# Patient Record
Sex: Female | Born: 1935 | Race: Black or African American | Hispanic: No | Smoking: Never smoker
Health system: Southern US, Community
[De-identification: ages and names within clinical notes are randomized; demographics above are authoritative.]

## PROBLEM LIST (undated history)

## (undated) DIAGNOSIS — E079 Disorder of thyroid, unspecified: Secondary | ICD-10-CM

## (undated) DIAGNOSIS — K625 Hemorrhage of anus and rectum: Secondary | ICD-10-CM

## (undated) DIAGNOSIS — F329 Major depressive disorder, single episode, unspecified: Secondary | ICD-10-CM

## (undated) DIAGNOSIS — M1990 Unspecified osteoarthritis, unspecified site: Secondary | ICD-10-CM

## (undated) DIAGNOSIS — N390 Urinary tract infection, site not specified: Secondary | ICD-10-CM

## (undated) DIAGNOSIS — E7800 Pure hypercholesterolemia, unspecified: Secondary | ICD-10-CM

## (undated) DIAGNOSIS — D689 Coagulation defect, unspecified: Secondary | ICD-10-CM

## (undated) DIAGNOSIS — Z8542 Personal history of malignant neoplasm of other parts of uterus: Secondary | ICD-10-CM

## (undated) DIAGNOSIS — N63 Unspecified lump in unspecified breast: Secondary | ICD-10-CM

## (undated) DIAGNOSIS — D509 Iron deficiency anemia, unspecified: Secondary | ICD-10-CM

## (undated) DIAGNOSIS — M797 Fibromyalgia: Secondary | ICD-10-CM

## (undated) DIAGNOSIS — F32A Depression, unspecified: Secondary | ICD-10-CM

## (undated) DIAGNOSIS — D72819 Decreased white blood cell count, unspecified: Secondary | ICD-10-CM

## (undated) DIAGNOSIS — K5730 Diverticulosis of large intestine without perforation or abscess without bleeding: Secondary | ICD-10-CM

## (undated) DIAGNOSIS — F0390 Unspecified dementia without behavioral disturbance: Secondary | ICD-10-CM

## (undated) DIAGNOSIS — I2699 Other pulmonary embolism without acute cor pulmonale: Secondary | ICD-10-CM

## (undated) HISTORY — DX: Depression, unspecified: F32.A

## (undated) HISTORY — DX: Fibromyalgia: M79.7

## (undated) HISTORY — PX: ABDOMINAL HYSTERECTOMY: SHX81

## (undated) HISTORY — DX: Hemorrhage of anus and rectum: K62.5

## (undated) HISTORY — DX: Unspecified lump in unspecified breast: N63.0

## (undated) HISTORY — DX: Unspecified dementia, unspecified severity, without behavioral disturbance, psychotic disturbance, mood disturbance, and anxiety: F03.90

## (undated) HISTORY — DX: Iron deficiency anemia, unspecified: D50.9

## (undated) HISTORY — DX: Major depressive disorder, single episode, unspecified: F32.9

## (undated) HISTORY — DX: Other pulmonary embolism without acute cor pulmonale: I26.99

## (undated) HISTORY — PX: KIDNEY SURGERY: SHX687

## (undated) HISTORY — DX: Unspecified osteoarthritis, unspecified site: M19.90

## (undated) HISTORY — DX: Coagulation defect, unspecified: D68.9

## (undated) HISTORY — PX: NECK SURGERY: SHX720

## (undated) HISTORY — DX: Decreased white blood cell count, unspecified: D72.819

## (undated) HISTORY — DX: Disorder of thyroid, unspecified: E07.9

## (undated) HISTORY — DX: Pure hypercholesterolemia, unspecified: E78.00

## (undated) HISTORY — DX: Personal history of malignant neoplasm of other parts of uterus: Z85.42

## (undated) HISTORY — DX: Urinary tract infection, site not specified: N39.0

## (undated) HISTORY — DX: Diverticulosis of large intestine without perforation or abscess without bleeding: K57.30

---

## 1990-10-01 HISTORY — PX: CORONARY ANGIOPLASTY: SHX604

## 1998-01-13 ENCOUNTER — Other Ambulatory Visit: Admission: RE | Admit: 1998-01-13 | Discharge: 1998-01-13 | Payer: Self-pay | Admitting: Hematology & Oncology

## 1998-02-14 ENCOUNTER — Other Ambulatory Visit: Admission: RE | Admit: 1998-02-14 | Discharge: 1998-02-14 | Payer: Self-pay | Admitting: Obstetrics & Gynecology

## 1999-06-08 ENCOUNTER — Ambulatory Visit (HOSPITAL_COMMUNITY): Admission: RE | Admit: 1999-06-08 | Discharge: 1999-06-08 | Payer: Self-pay | Admitting: General Surgery

## 1999-06-08 ENCOUNTER — Encounter (HOSPITAL_BASED_OUTPATIENT_CLINIC_OR_DEPARTMENT_OTHER): Payer: Self-pay | Admitting: General Surgery

## 2000-03-20 ENCOUNTER — Other Ambulatory Visit: Admission: RE | Admit: 2000-03-20 | Discharge: 2000-03-20 | Payer: Self-pay | Admitting: Obstetrics & Gynecology

## 2000-11-12 ENCOUNTER — Encounter (HOSPITAL_BASED_OUTPATIENT_CLINIC_OR_DEPARTMENT_OTHER): Payer: Self-pay | Admitting: General Surgery

## 2000-11-12 ENCOUNTER — Ambulatory Visit (HOSPITAL_COMMUNITY): Admission: RE | Admit: 2000-11-12 | Discharge: 2000-11-12 | Payer: Self-pay | Admitting: General Surgery

## 2001-04-16 ENCOUNTER — Other Ambulatory Visit: Admission: RE | Admit: 2001-04-16 | Discharge: 2001-04-16 | Payer: Self-pay | Admitting: Obstetrics & Gynecology

## 2002-10-01 HISTORY — PX: CATARACT EXTRACTION: SUR2

## 2002-10-07 ENCOUNTER — Other Ambulatory Visit: Admission: RE | Admit: 2002-10-07 | Discharge: 2002-10-07 | Payer: Self-pay | Admitting: Obstetrics & Gynecology

## 2003-01-14 ENCOUNTER — Encounter: Payer: Self-pay | Admitting: Ophthalmology

## 2003-01-19 ENCOUNTER — Ambulatory Visit (HOSPITAL_COMMUNITY): Admission: RE | Admit: 2003-01-19 | Discharge: 2003-01-19 | Payer: Self-pay | Admitting: Ophthalmology

## 2003-11-08 ENCOUNTER — Other Ambulatory Visit: Admission: RE | Admit: 2003-11-08 | Discharge: 2003-11-08 | Payer: Self-pay | Admitting: Obstetrics & Gynecology

## 2005-05-14 ENCOUNTER — Ambulatory Visit: Payer: Self-pay | Admitting: Gastroenterology

## 2005-06-13 ENCOUNTER — Ambulatory Visit: Payer: Self-pay | Admitting: Gastroenterology

## 2005-06-21 ENCOUNTER — Ambulatory Visit: Payer: Self-pay | Admitting: Gastroenterology

## 2006-01-15 ENCOUNTER — Other Ambulatory Visit: Admission: RE | Admit: 2006-01-15 | Discharge: 2006-01-15 | Payer: Self-pay | Admitting: Obstetrics & Gynecology

## 2006-02-19 ENCOUNTER — Encounter: Admission: RE | Admit: 2006-02-19 | Discharge: 2006-02-19 | Payer: Self-pay | Admitting: Obstetrics & Gynecology

## 2006-09-18 ENCOUNTER — Ambulatory Visit: Payer: Self-pay | Admitting: Gastroenterology

## 2006-10-18 ENCOUNTER — Ambulatory Visit: Payer: Self-pay | Admitting: Gastroenterology

## 2006-10-18 LAB — CONVERTED CEMR LAB
BUN: 12 mg/dL (ref 6–23)
CK-MB: 1 ng/mL (ref 0.3–4.0)

## 2006-10-22 ENCOUNTER — Ambulatory Visit: Payer: Self-pay | Admitting: *Deleted

## 2007-02-03 ENCOUNTER — Ambulatory Visit: Payer: Self-pay | Admitting: Gastroenterology

## 2007-02-03 LAB — CONVERTED CEMR LAB
Alkaline Phosphatase: 59 units/L (ref 39–117)
BUN: 15 mg/dL (ref 6–23)
Basophils Absolute: 0 10*3/uL (ref 0.0–0.1)
Creatinine, Ser: 1 mg/dL (ref 0.4–1.2)
Eosinophils Absolute: 0.2 10*3/uL (ref 0.0–0.6)
Folate: 19.5 ng/mL
GFR calc Af Amer: 68 mL/min
Glucose, Bld: 91 mg/dL (ref 70–99)
HCT: 39.3 % (ref 36.0–46.0)
Hemoglobin: 13.2 g/dL (ref 12.0–15.0)
Lymphocytes Relative: 49.8 % — ABNORMAL HIGH (ref 12.0–46.0)
MCV: 95.7 fL (ref 78.0–100.0)
Monocytes Absolute: 0.4 10*3/uL (ref 0.2–0.7)
Neutro Abs: 1.4 10*3/uL (ref 1.4–7.7)
Neutrophils Relative %: 34.9 % — ABNORMAL LOW (ref 43.0–77.0)
Platelets: 250 10*3/uL (ref 150–400)
Total Protein: 7 g/dL (ref 6.0–8.3)
Transferrin: 244.3 mg/dL (ref >=212.0)

## 2007-02-26 ENCOUNTER — Ambulatory Visit: Payer: Self-pay | Admitting: Gastroenterology

## 2007-02-26 LAB — CONVERTED CEMR LAB
Fecal Occult Blood: NEGATIVE
OCCULT 4: NEGATIVE

## 2007-06-12 ENCOUNTER — Ambulatory Visit: Payer: Self-pay | Admitting: Internal Medicine

## 2007-06-12 LAB — CONVERTED CEMR LAB
Basophils Relative: 0.6 % (ref 0.0–1.0)
Eosinophils Absolute: 0.1 10*3/uL (ref 0.0–0.6)
Eosinophils Relative: 2.5 % (ref 0.0–5.0)
HCT: 39.6 % (ref 36.0–46.0)
Lymphocytes Relative: 37.6 % (ref 12.0–46.0)
Monocytes Absolute: 0.3 10*3/uL (ref 0.2–0.7)
Neutro Abs: 2 10*3/uL (ref 1.4–7.7)
Neutrophils Relative %: 50.6 % (ref 43.0–77.0)
RDW: 13 % (ref 11.5–14.6)
WBC: 3.9 10*3/uL — ABNORMAL LOW (ref 4.5–10.5)

## 2007-07-01 ENCOUNTER — Ambulatory Visit: Payer: Self-pay | Admitting: Internal Medicine

## 2007-07-01 HISTORY — PX: COLONOSCOPY: SHX5424

## 2007-07-03 ENCOUNTER — Ambulatory Visit: Payer: Self-pay | Admitting: Internal Medicine

## 2007-07-16 ENCOUNTER — Ambulatory Visit: Payer: Self-pay | Admitting: Internal Medicine

## 2007-07-16 HISTORY — PX: UPPER GASTROINTESTINAL ENDOSCOPY: SHX188

## 2007-08-12 ENCOUNTER — Ambulatory Visit: Payer: Self-pay | Admitting: Cardiology

## 2007-08-14 ENCOUNTER — Ambulatory Visit (HOSPITAL_COMMUNITY): Admission: RE | Admit: 2007-08-14 | Discharge: 2007-08-14 | Payer: Self-pay | Admitting: Pulmonary Disease

## 2007-08-14 ENCOUNTER — Encounter (INDEPENDENT_AMBULATORY_CARE_PROVIDER_SITE_OTHER): Payer: Self-pay | Admitting: Pulmonary Disease

## 2007-08-14 ENCOUNTER — Ambulatory Visit: Payer: Self-pay | Admitting: Vascular Surgery

## 2007-08-19 ENCOUNTER — Ambulatory Visit: Payer: Self-pay

## 2007-08-26 ENCOUNTER — Ambulatory Visit: Payer: Self-pay | Admitting: Cardiology

## 2007-10-27 ENCOUNTER — Ambulatory Visit: Payer: Self-pay | Admitting: Cardiology

## 2007-12-08 ENCOUNTER — Encounter (HOSPITAL_COMMUNITY): Admission: RE | Admit: 2007-12-08 | Discharge: 2007-12-09 | Payer: Self-pay | Admitting: Pulmonary Disease

## 2007-12-18 DIAGNOSIS — F329 Major depressive disorder, single episode, unspecified: Secondary | ICD-10-CM

## 2007-12-18 DIAGNOSIS — E7800 Pure hypercholesterolemia, unspecified: Secondary | ICD-10-CM

## 2007-12-18 DIAGNOSIS — I2510 Atherosclerotic heart disease of native coronary artery without angina pectoris: Secondary | ICD-10-CM

## 2007-12-18 DIAGNOSIS — D72819 Decreased white blood cell count, unspecified: Secondary | ICD-10-CM | POA: Insufficient documentation

## 2007-12-18 DIAGNOSIS — D509 Iron deficiency anemia, unspecified: Secondary | ICD-10-CM | POA: Insufficient documentation

## 2007-12-31 HISTORY — PX: OTHER SURGICAL HISTORY: SHX169

## 2008-01-02 ENCOUNTER — Ambulatory Visit: Payer: Self-pay | Admitting: Cardiology

## 2008-01-02 ENCOUNTER — Ambulatory Visit: Payer: Self-pay

## 2008-01-07 ENCOUNTER — Ambulatory Visit: Payer: Self-pay | Admitting: Cardiology

## 2008-01-15 ENCOUNTER — Encounter: Admission: RE | Admit: 2008-01-15 | Discharge: 2008-01-15 | Payer: Self-pay | Admitting: Pulmonary Disease

## 2008-01-21 ENCOUNTER — Encounter (INDEPENDENT_AMBULATORY_CARE_PROVIDER_SITE_OTHER): Payer: Self-pay | Admitting: Interventional Radiology

## 2008-01-21 ENCOUNTER — Encounter: Admission: RE | Admit: 2008-01-21 | Discharge: 2008-01-21 | Payer: Self-pay | Admitting: Pulmonary Disease

## 2008-01-21 ENCOUNTER — Other Ambulatory Visit: Admission: RE | Admit: 2008-01-21 | Discharge: 2008-01-21 | Payer: Self-pay | Admitting: Interventional Radiology

## 2008-01-28 ENCOUNTER — Ambulatory Visit: Payer: Self-pay | Admitting: Cardiology

## 2008-04-05 ENCOUNTER — Ambulatory Visit: Payer: Self-pay | Admitting: Cardiology

## 2008-04-05 LAB — CONVERTED CEMR LAB
LDL Cholesterol: 52 mg/dL (ref 0–99)
Total CHOL/HDL Ratio: 2.1
Total CK: 49 units/L (ref 7–177)

## 2008-07-06 ENCOUNTER — Ambulatory Visit: Payer: Self-pay | Admitting: Cardiology

## 2008-08-02 ENCOUNTER — Telehealth: Payer: Self-pay | Admitting: Internal Medicine

## 2008-08-16 ENCOUNTER — Ambulatory Visit: Payer: Self-pay | Admitting: Internal Medicine

## 2008-08-20 ENCOUNTER — Telehealth: Payer: Self-pay | Admitting: Internal Medicine

## 2008-09-06 ENCOUNTER — Telehealth: Payer: Self-pay | Admitting: Internal Medicine

## 2009-01-12 ENCOUNTER — Encounter: Payer: Self-pay | Admitting: Cardiology

## 2009-01-12 ENCOUNTER — Ambulatory Visit: Payer: Self-pay | Admitting: Cardiology

## 2009-01-18 ENCOUNTER — Ambulatory Visit: Payer: Self-pay | Admitting: Cardiology

## 2009-01-19 LAB — CONVERTED CEMR LAB
ALT: 20 units/L (ref 0–35)
AST: 25 units/L (ref 0–37)
Albumin: 3.4 g/dL — ABNORMAL LOW (ref 3.5–5.2)
Alkaline Phosphatase: 60 units/L (ref 39–117)
Bilirubin, Direct: 0.1 mg/dL (ref 0.0–0.3)
Cholesterol: 128 mg/dL (ref 0–200)
HDL: 65 mg/dL (ref >39.00)
LDL Cholesterol: 51 mg/dL (ref 0–99)
Total Bilirubin: 0.6 mg/dL (ref 0.3–1.2)
Total CHOL/HDL Ratio: 2
Total Protein: 6.7 g/dL (ref 6.0–8.3)
Triglycerides: 58 mg/dL (ref 0.0–149.0)
VLDL: 11.6 mg/dL (ref 0.0–40.0)

## 2009-01-28 ENCOUNTER — Telehealth: Payer: Self-pay | Admitting: Cardiology

## 2009-03-15 ENCOUNTER — Emergency Department (HOSPITAL_COMMUNITY): Admission: EM | Admit: 2009-03-15 | Discharge: 2009-03-15 | Payer: Self-pay | Admitting: Emergency Medicine

## 2009-03-18 ENCOUNTER — Encounter: Admission: RE | Admit: 2009-03-18 | Discharge: 2009-03-18 | Payer: Self-pay | Admitting: Otolaryngology

## 2009-03-29 ENCOUNTER — Telehealth: Payer: Self-pay | Admitting: Internal Medicine

## 2009-04-18 ENCOUNTER — Ambulatory Visit: Payer: Self-pay | Admitting: Internal Medicine

## 2009-05-24 ENCOUNTER — Ambulatory Visit: Payer: Self-pay | Admitting: Vascular Surgery

## 2009-05-24 ENCOUNTER — Ambulatory Visit (HOSPITAL_COMMUNITY): Admission: RE | Admit: 2009-05-24 | Discharge: 2009-05-24 | Payer: Self-pay | Admitting: Pulmonary Disease

## 2009-05-24 ENCOUNTER — Encounter (INDEPENDENT_AMBULATORY_CARE_PROVIDER_SITE_OTHER): Payer: Self-pay | Admitting: Pulmonary Disease

## 2009-08-17 ENCOUNTER — Encounter: Payer: Self-pay | Admitting: Cardiovascular Disease

## 2009-08-17 ENCOUNTER — Ambulatory Visit: Payer: Self-pay

## 2009-08-17 DIAGNOSIS — R269 Unspecified abnormalities of gait and mobility: Secondary | ICD-10-CM

## 2009-08-29 ENCOUNTER — Encounter: Payer: Self-pay | Admitting: Cardiovascular Disease

## 2009-08-30 ENCOUNTER — Ambulatory Visit: Payer: Self-pay | Admitting: Cardiology

## 2009-08-30 ENCOUNTER — Encounter: Payer: Self-pay | Admitting: Nurse Practitioner

## 2009-08-30 DIAGNOSIS — R42 Dizziness and giddiness: Secondary | ICD-10-CM | POA: Insufficient documentation

## 2009-08-30 DIAGNOSIS — R0789 Other chest pain: Secondary | ICD-10-CM | POA: Insufficient documentation

## 2009-10-17 ENCOUNTER — Encounter: Admission: RE | Admit: 2009-10-17 | Discharge: 2009-10-17 | Payer: Self-pay | Admitting: Otolaryngology

## 2009-10-28 ENCOUNTER — Ambulatory Visit: Payer: Self-pay | Admitting: Cardiology

## 2009-10-28 DIAGNOSIS — R002 Palpitations: Secondary | ICD-10-CM

## 2009-11-05 ENCOUNTER — Observation Stay (HOSPITAL_COMMUNITY): Admission: EM | Admit: 2009-11-05 | Discharge: 2009-11-07 | Payer: Self-pay | Admitting: Emergency Medicine

## 2010-02-06 ENCOUNTER — Encounter: Payer: Self-pay | Admitting: Cardiology

## 2010-03-01 ENCOUNTER — Ambulatory Visit: Payer: Self-pay | Admitting: Cardiology

## 2010-04-06 ENCOUNTER — Ambulatory Visit: Payer: Self-pay | Admitting: Internal Medicine

## 2010-05-08 ENCOUNTER — Encounter: Admission: RE | Admit: 2010-05-08 | Discharge: 2010-05-08 | Payer: Self-pay | Admitting: Obstetrics & Gynecology

## 2010-07-19 ENCOUNTER — Ambulatory Visit: Payer: Self-pay | Admitting: Cardiology

## 2010-07-24 ENCOUNTER — Ambulatory Visit: Payer: Self-pay | Admitting: Cardiology

## 2010-07-24 ENCOUNTER — Inpatient Hospital Stay (HOSPITAL_COMMUNITY): Admission: EM | Admit: 2010-07-24 | Discharge: 2010-08-02 | Payer: Self-pay | Admitting: Emergency Medicine

## 2010-07-25 HISTORY — PX: CARDIAC CATHETERIZATION: SHX172

## 2010-07-26 ENCOUNTER — Encounter: Payer: Self-pay | Admitting: Cardiology

## 2010-08-04 ENCOUNTER — Ambulatory Visit: Payer: Self-pay | Admitting: Cardiology

## 2010-08-04 LAB — CONVERTED CEMR LAB: POC INR: 2.2

## 2010-08-11 ENCOUNTER — Ambulatory Visit: Payer: Self-pay | Admitting: Cardiology

## 2010-08-15 ENCOUNTER — Ambulatory Visit: Payer: Self-pay | Admitting: Cardiology

## 2010-08-15 ENCOUNTER — Ambulatory Visit: Payer: Self-pay | Admitting: Cardiovascular Disease

## 2010-08-15 DIAGNOSIS — I2699 Other pulmonary embolism without acute cor pulmonale: Secondary | ICD-10-CM

## 2010-08-15 DIAGNOSIS — Z87448 Personal history of other diseases of urinary system: Secondary | ICD-10-CM

## 2010-08-15 LAB — CONVERTED CEMR LAB: POC INR: 1.7

## 2010-08-21 ENCOUNTER — Encounter: Payer: Self-pay | Admitting: Cardiology

## 2010-08-22 ENCOUNTER — Ambulatory Visit: Payer: Self-pay | Admitting: Cardiovascular Disease

## 2010-08-22 LAB — CONVERTED CEMR LAB: POC INR: 2.9

## 2010-09-11 ENCOUNTER — Ambulatory Visit: Payer: Self-pay | Admitting: Cardiology

## 2010-09-11 LAB — CONVERTED CEMR LAB: POC INR: 2.3

## 2010-09-15 ENCOUNTER — Ambulatory Visit: Payer: Self-pay | Admitting: Cardiology

## 2010-10-09 ENCOUNTER — Ambulatory Visit: Admission: RE | Admit: 2010-10-09 | Discharge: 2010-10-09 | Payer: Self-pay | Source: Home / Self Care

## 2010-10-09 LAB — CONVERTED CEMR LAB: POC INR: 1.9

## 2010-10-23 ENCOUNTER — Ambulatory Visit: Admission: RE | Admit: 2010-10-23 | Discharge: 2010-10-23 | Payer: Self-pay | Source: Home / Self Care

## 2010-10-29 LAB — CONVERTED CEMR LAB
Basophils Absolute: 0 10*3/uL (ref 0.0–0.1)
Basophils Relative: 0.2 % (ref 0.0–3.0)
Eosinophils Absolute: 0.1 10*3/uL (ref 0.0–0.7)
Eosinophils Relative: 2.5 % (ref 0.0–5.0)
HCT: 37.3 % (ref 36.0–46.0)
Hemoglobin: 12.8 g/dL (ref 12.0–15.0)
Lymphocytes Relative: 42.4 % (ref 12.0–46.0)
Lymphs Abs: 2.1 10*3/uL (ref 0.7–4.0)
MCHC: 34.3 g/dL (ref 30.0–36.0)
MCV: 98 fL (ref 78.0–100.0)
Monocytes Absolute: 0.2 10*3/uL (ref 0.1–1.0)
Monocytes Relative: 3.9 % (ref 3.0–12.0)
Neutro Abs: 2.5 10*3/uL (ref 1.4–7.7)
Neutrophils Relative %: 51 % (ref 43.0–77.0)
Platelets: 271 10*3/uL (ref 150.0–400.0)
RBC: 3.8 M/uL — ABNORMAL LOW (ref 3.87–5.11)
RDW: 14.4 % (ref 11.5–14.6)
WBC: 4.9 10*3/uL (ref 4.5–10.5)

## 2010-11-02 NOTE — Medication Information (Signed)
Summary: rov/ej  Anticoagulant Therapy  Managed by: Earvin Hansen, PharmD Referring MD: Riley Kill MD, Maisie Fus PCP: Myrtie Neither Supervising MD: Daleen Squibb MD, Maisie Fus Indication 1: Pulmonary Embolism Lab Used: [facility] [place] Site: [address] INR POC 2.4 INR RANGE 2.0-3.0  Dietary changes: no    Health status changes: no    Bleeding/hemorrhagic complications: no    Recent/future hospitalizations: no    Any changes in medication regimen? no    Recent/future dental: no  Any missed doses?: no       Is patient compliant with meds? yes       Allergies: No Known Drug Allergies  Anticoagulation Management History:      Positive risk factors for bleeding include an age of 76 years or older.  The bleeding index is 'intermediate risk'.  Positive CHADS2 values include Age > 42 years old.  Anticoagulation responsible provider: Daleen Squibb MD, Maisie Fus.  INR POC: 2.4.  Exp: 07/2011.    Anticoagulation Management Assessment/Plan:      The patient's current anticoagulation dose is Warfarin sodium 5 mg tabs: Use as directed by Anticoagulation Clinic.  The target INR is 2.0-3.0.  The next INR is due 11/13/2010.  Anticoagulation instructions were given to patient.  Results were reviewed/authorized by Earvin Hansen, PharmD.         Prior Anticoagulation Instructions: INR 1.9   Take 1 and 1/2 tablets today then resume taking 1 tablet everday. Will re-check INR in 2 weeks.  Patient states has been eating some extra greens during the holidays.   Current Anticoagulation Instructions: Continue taking 5 mg (1 tablet) daily.  INR 2.4

## 2010-11-02 NOTE — Assessment & Plan Note (Signed)
Summary: 3 month rov   Visit Type:  3 months follow up Referring Provider:  n/a Primary Provider:  Myrtie Neither  CC:  No complains.  History of Present Illness: Once in  awhile she will feel  a pinch when she gets into bed.  She wonder weather it was in the breast or heart.  She was Dr. Jennette Kettle, and had a mammogram.. This was negative.  Current Medications (verified): 1)  Lipitor 40 Mg Tabs (Atorvastatin Calcium) .Marland Kitchen.. 1 By Mouth Once Daily 2)  Plavix 75 Mg Tabs (Clopidogrel Bisulfate) .Marland Kitchen.. 1 By Mouth Once Daily 3)  Xalatan 0.005 % Soln (Latanoprost) .... As Needed 4)  Aspirin 81 Mg  Tabs (Aspirin) .Marland Kitchen.. 1 By Mouth Once Daily 5)  Oscal 500/200 D-3 500-200 Mg-Unit Tabs (Calcium-Vitamin D) .... 2 Once Daily 6)  Metamucil Smooth Texture 28.3 % Powd (Psyllium) .Marland Kitchen.. 1 Tablespoon Every Other Day 7)  Centrum Silver  Tabs (Multiple Vitamins-Minerals) .... Take 1 By Mouth Once Daily 8)  Vitamin D 2000 Unit Tabs (Cholecalciferol) .... Take 1 Tablet By Mouth Once A Day 9)  Lyrica 75 Mg Caps (Pregabalin) .... Once A Day 10)  Zetia 10 Mg Tabs (Ezetimibe) .... Take One Tablet By Mouth Daily.  Allergies (verified): No Known Drug Allergies  Past History:  Past Medical History: Last updated: 08/30/2009 CAD (ICD-414.00)      a. s/p ptca LAD in 1992      b. s/p cath 1995 - nonobs.      c. s/p myoview 2008:  Apical thinning vs. mild mixed scar & ischemia. HYPERCHOLESTEROLEMIA (ICD-272.0) DEPRESSION (ICD-311) LEUKOPENIA, MILD (ICD-288.50) DIVERTICULOSIS OF COLON  GLAUCOMA ORTHOSTATIC DIZZINESS  Past Surgical History: Last updated: 01/11/2009 Cervical spine surgery Hysterectomy  coronary angioplasty of the LAD in 1992.    Senile nuclear cataract, right eye-2004  Family History: Last updated: 04/18/2009 Family History of Pancreatic Cancer:father Family History of Diabetes: mother  No FH of Colon Cancer:  Social History: Last updated: 01/11/2009  Occupation: retired Patient has  never smoked.  Alcohol Use - no Illicit Drug Use - no Patient gets regular exercise.  Risk Factors: Exercise: yes (08/16/2008)  Risk Factors: Smoking Status: never (08/16/2008)  Vital Signs:  Patient profile:   77 year old female Height:      64 inches Weight:      150.50 pounds BMI:     25.93 Pulse rate:   65 / minute Pulse rhythm:   regular Resp:     18 per minute BP sitting:   116 / 70  (left arm) Cuff size:   large  Vitals Entered By: Vikki Ports (March 01, 2010 4:06 PM)  Physical Exam  General:  Well developed, well nourished, in no acute distress. Head:  normocephalic and atraumatic Eyes:  PERRLA/EOM intact; conjunctiva and lids normal. Breasts:  has a line of irritation along bra spot.  Lungs:  Clear bilaterally to auscultation and percussion. Heart:  PMI non displaced.  Normal S1 and S2.  Minimal SEM.  No DM.   EKG  Procedure date:  03/01/2010  Findings:      Nonspecific T abnormality. NSR.  Possible LAE.  Minor non specific ST and T abnormality  Impression & Recommendations:  Problem # 1:  CHEST PAIN, ATYPICAL (ICD-786.59) Her current complaint almost certainly related to irritation from bra.  Reviewed by nursing staff and myself.  Suggestions given.   Problem # 2:  CAD (ICD-414.00) Continues to remain stable.  no acute ECG changes.  Her  updated medication list for this problem includes:    Plavix 75 Mg Tabs (Clopidogrel bisulfate) .Marland Kitchen... 1 by mouth once daily    Aspirin 81 Mg Tabs (Aspirin) .Marland Kitchen... 1 by mouth once daily  Problem # 3:  HYPERCHOLESTEROLEMIA (ICD-272.0) Followed  by her primary. Her updated medication list for this problem includes:    Lipitor 40 Mg Tabs (Atorvastatin calcium) .Marland Kitchen... 1 by mouth once daily    Zetia 10 Mg Tabs (Ezetimibe) .Marland Kitchen... Take one tablet by mouth daily.  Other Orders: EKG w/ Interpretation (93000)  Patient Instructions: 1)  Your physician recommends that you continue on your current medications as directed.  Please refer to the Current Medication list given to you today. 2)  Your physician wants you to follow-up in:   4 MONTHS. You will receive a reminder letter in the mail two months in advance. If you don't receive a letter, please call our office to schedule the follow-up appointment.

## 2010-11-02 NOTE — Medication Information (Signed)
Summary: rov/tm  Anticoagulant Therapy  Managed by: Bethena Midget, RN, BSN Referring MD: Riley Kill MD, Maisie Fus PCP: Myrtie Neither Supervising MD: Jens Som MD, Arlys John Indication 1: Pulmonary Embolism Lab Used: [facility] [place] Site: [address] INR POC 2.3 INR RANGE 2.0-3.0  Dietary changes: no    Health status changes: no    Bleeding/hemorrhagic complications: no    Recent/future hospitalizations: no    Any changes in medication regimen? no    Recent/future dental: no  Any missed doses?: no       Is patient compliant with meds? yes       Allergies: No Known Drug Allergies  Anticoagulation Management History:      The patient is taking warfarin and comes in today for a routine follow up visit.  Positive risk factors for bleeding include an age of 75 years or older.  The bleeding index is 'intermediate risk'.  Positive CHADS2 values include Age > 75 years old.  Anticoagulation responsible provider: Jens Som MD, Arlys John.  INR POC: 2.3.  Cuvette Lot#: [id number].  Exp: 09/2011.    Anticoagulation Management Assessment/Plan:      The patient's current anticoagulation dose is Warfarin sodium 5 mg tabs: Use as directed by Anticoagulation Clinic.  The next INR is due 08/15/2010.  Anticoagulation instructions were given to patient.  Results were reviewed/authorized by Bethena Midget, RN, BSN.  She was notified by Bethena Midget, RN, BSN.         Prior Anticoagulation Instructions: INR 2.2 Continue taking 1 pill each day. Recheck in one week.   Current Anticoagulation Instructions: INR 2.3 Continue 1 pill everyday. Recheck in one week.

## 2010-11-02 NOTE — Medication Information (Signed)
Summary: rov/kh  Anticoagulant Therapy  Managed by: Tammy Sours, PharmD Referring MD: Riley Kill MD, Maisie Fus PCP: Myrtie Neither Supervising MD: Excell Seltzer MD, Casimiro Needle Indication 1: Pulmonary Embolism Lab Used: [facility] [place] Site: [address] INR POC 1.9 INR RANGE 2.0-3.0  Dietary changes: yes       Details: Eating collards due to the holidays   Health status changes: no    Bleeding/hemorrhagic complications: no    Recent/future hospitalizations: no    Any changes in medication regimen? no    Recent/future dental: no  Any missed doses?: no       Is patient compliant with meds? yes      Comments: Patient states has been eating some extra collards due to the holidays. Patient also states that her urine is darker than normal but is more orange looking.   Allergies: No Known Drug Allergies  Anticoagulation Management History:      The patient is taking warfarin and comes in today for a routine follow up visit.  Positive risk factors for bleeding include an age of 75 years or older.  The bleeding index is 'intermediate risk'.  Positive CHADS2 values include Age > 82 years old.  Anticoagulation responsible provider: Excell Seltzer MD, Casimiro Needle.  INR POC: 1.9.  Cuvette Lot#: [id number].  Exp: 07/2011.    Anticoagulation Management Assessment/Plan:      The patient's current anticoagulation dose is Warfarin sodium 5 mg tabs: Use as directed by Anticoagulation Clinic.  The target INR is 2.0-3.0.  The next INR is due 10/23/2010.  Anticoagulation instructions were given to patient.  Results were reviewed/authorized by Tammy Sours, PharmD.         Prior Anticoagulation Instructions: INR 2.3  Continue same dose of 1 tablet every day.  Recheck INR in 4 weeks.   Current Anticoagulation Instructions: INR 1.9   Take 1 and 1/2 tablets today then resume taking 1 tablet everday. Will re-check INR in 2 weeks.  Patient states has been eating some extra greens during the  holidays.

## 2010-11-02 NOTE — Medication Information (Signed)
Summary: rov/mw  Anticoagulant Therapy  Managed by: Reina Fuse, PharmD Referring MD: Riley Kill MD, Maisie Fus PCP: Myrtie Neither Supervising MD: Clifton James MD, Cristal Deer Indication 1: Pulmonary Embolism Lab Used: [facility] [place] Site: [address] INR POC 2.9 INR RANGE 2.0-3.0  Dietary changes: no    Health status changes: no    Bleeding/hemorrhagic complications: no    Recent/future hospitalizations: no    Any changes in medication regimen? no    Recent/future dental: no  Any missed doses?: no       Is patient compliant with meds? yes      Comments: Pt was low at last visit due to a missed dose.   Allergies: No Known Drug Allergies  Anticoagulation Management History:      The patient is taking warfarin and comes in today for a routine follow up visit.  Positive risk factors for bleeding include an age of 75 years or older.  The bleeding index is 'intermediate risk'.  Positive CHADS2 values include Age > 30 years old.  Anticoagulation responsible provider: Clifton James MD, Cristal Deer.  INR POC: 2.9.  Cuvette Lot#: [id number].  Exp: 09/2011.    Anticoagulation Management Assessment/Plan:      The patient's current anticoagulation dose is Warfarin sodium 5 mg tabs: Use as directed by Anticoagulation Clinic.  The next INR is due 09/12/2010.  Anticoagulation instructions were given to patient.  Results were reviewed/authorized by Reina Fuse, PharmD.  She was notified by Reina Fuse PharmD.         Prior Anticoagulation Instructions: INR 1.7 Today take 1.5 tablets. Then resume normal dosing schedule of 1 tablet everyday. Recheck in 1 week.   Current Anticoagulation Instructions: INR 2.9  Continue taking Coumadin 1 tab (5 mg) every day. Return to clinic in 3 weeks.

## 2010-11-02 NOTE — Assessment & Plan Note (Signed)
Summary: eph per dayna call/lg   Visit Type:  Follow-up Referring Provider:  n/a Primary Provider:  Myrtie Neither  CC:  Post-hospital.  History of Present Illness: Ms Castrellon is here today for a hospital follow up after she was diagnosed with pulm embolism last month.  She has been compliant with her meds and last INR checked was 2.3. She is due for inr today. No chest pain noted, though she does note a "thud" in her chest at night which goes away by itself, not bothering her, exacerbated by palpation. No association with exertion. No bleed PR noted.  Lipid profile: Last checked LDL 51 on lipitor.  Hematuria has completely resolved now. She had a follow up appointment with [facility] on friday.  Non obstructive CAD: cont aspirin. No BB due to bradycardia at discharge. 80 bpm today.      Problems Prior to Update: 1)  Hematuria, Hx of  (ICD-V13.09) 2)  Pulmonary Embolism  (ICD-415.19) 3)  Palpitations  (ICD-785.1) 4)  Orthostatic Dizziness  (ICD-780.4) 5)  Chest Pain, Atypical  (ICD-786.59) 6)  Unsteady Gait  (ICD-781.2) 7)  Cad  (ICD-414.00) 8)  Hypercholesterolemia  (ICD-272.0) 9)  Obesity, Mild  (ICD-278.02) 10)  Depression  (ICD-311) 11)  Unspecified Cardiovascular Disease  (ICD-429.2) 12)  Iron Deficiency  (ICD-280.9) 13)  Rectal Bleeding  (ICD-569.3) 14)  Abdominal Pain, Left Lower Quadrant  (ICD-789.04) 15)  Leukopenia, Mild  (ICD-288.50) 16)  Urinary Hesitancy  (ICD-788.64) 17)  Diverticulosis of Colon  (ICD-562.10)  Current Medications (verified): 1)  Lipitor 40 Mg Tabs (Atorvastatin Calcium) .Marland Kitchen.. 1 By Mouth Once Daily 2)  Latanoprost 0.005 % Soln (Latanoprost) .... One Drop Each Eye At Bedtime 3)  Aspirin 81 Mg  Tabs (Aspirin) .Marland Kitchen.. 1 By Mouth Once Daily 4)  Oscal 500/200 D-3 500-200 Mg-Unit Tabs (Calcium-Vitamin D) .Marland Kitchen.. 1 Once Daily 5)  Metamucil Smooth Texture 28.3 % Powd (Psyllium) .... 2  Tablespoon Every Day 6)  Centrum Silver  Tabs (Multiple  Vitamins-Minerals) .... Take 1 By Mouth Once Daily 7)  Lyrica 75 Mg Caps (Pregabalin) .... Once A Day As Needed 8)  Probiotic  Caps (Probiotic Product) .... Once Daily 9)  Warfarin Sodium 5 Mg Tabs (Warfarin Sodium) .... Use As Directed By Anticoagulation Clinic 10)  Nitrostat 0.4 Mg Subl (Nitroglycerin) .Marland Kitchen.. 1 Tablet Under Tongue At Onset of Chest Pain; You May Repeat Every 5 Minutes For Up To 3 Doses. 11)  Dulcolax 5 Mg Tbec (Bisacodyl) .... As Needed 12)  Zolpidem Tartrate 6.25 Mg Cr-Tabs (Zolpidem Tartrate) .... As Needed At Bedtime 13)  Metamucil 30.9 % Powd (Psyllium) .... 2 Tsp Once A Day  Allergies (verified): No Known Drug Allergies  Past History:  Past Medical History: Last updated: 04/06/2010 CAD (ICD-414.00)      a. s/p ptca LAD in 1992      b. s/p cath 1995 - nonobs.      c. s/p myoview 2008:  Apical thinning vs. mild mixed scar & ischemia. HYPERCHOLESTEROLEMIA (ICD-272.0) DEPRESSION (ICD-311) LEUKOPENIA, MILD (ICD-288.50) DIVERTICULOSIS OF COLON  GLAUCOMA ORTHOSTATIC DIZZINESS Fibromyalgia  Family History: Last updated: 04/18/2009 Family History of Pancreatic Cancer:father Family History of Diabetes: mother  No FH of Colon Cancer:  Risk Factors: Exercise: yes (08/16/2008)  Family History: Reviewed history from 04/18/2009 and no changes required. Family History of Pancreatic Cancer:father Family History of Diabetes: mother  No FH of Colon Cancer:  Social History: Reviewed history from 01/11/2009 and no changes required.  Occupation: retired Patient has never smoked.  Alcohol Use - no Illicit Drug Use - no Patient gets regular exercise.  Review of Systems      See HPI  Vital Signs:  Patient profile:   77 year old female Height:      64 inches Weight:      154 pounds BMI:     26.53 Pulse rate:   80 / minute Pulse rhythm:   regular Resp:     18 per minute BP sitting:   106 / 64  (left arm) Cuff size:   large  Vitals Entered By: Vikki Ports (August 15, 2010 1:44 PM)  Physical Exam  Additional Exam:  Gen: AOx3, in no acute distress Eyes: PERRL, EOMI ENT:MMM, No erythema noted in posterior pharynx Neck: No JVD, No LAP Chest: CTAB with  good respiratory effort CVS: regular rhythmic rate, NO M/R/G, S1 S2 normal Abdo: soft,ND, BS+x4, Non tender and No hepatosplenomegaly EXT: No odema noted Neuro: Non focal, gait is normal Skin: no rashes noted.    EKG  Procedure date:  08/15/2010  Findings:      Unchanged from previous EKG Non specific ST and T wave changes. 80/min  Impression & Recommendations:  Problem # 1:  PULMONARY EMBOLISM (ICD-415.19) Assessment Unchanged Keep on coumadin. Check INR today. Presented with chest pain, and followup cath revealed no evidence of recurrence and patent PTCA site.  CT consistent with multiple PE.  Will keep on current regimen.  Discussed in detail with patient.  TS  Her updated medication list for this problem includes:    Aspirin 81 Mg Tabs (Aspirin) .Marland Kitchen... 1 by mouth once daily    Warfarin Sodium 5 Mg Tabs (Warfarin sodium) ..... Use as directed by anticoagulation clinic  Problem # 2:  CAD (ICD-414.00) Assessment: New Cath shows non obstructive disease. Continue Aspirin.  Her updated medication list for this problem includes:    Aspirin 81 Mg Tabs (Aspirin) .Marland Kitchen... 1 by mouth once daily    Warfarin Sodium 5 Mg Tabs (Warfarin sodium) ..... Use as directed by anticoagulation clinic    Nitrostat 0.4 Mg Subl (Nitroglycerin) .Marland Kitchen... 1 tablet under tongue at onset of chest pain; you may repeat every 5 minutes for up to 3 doses.  < Template too large >  Problem # 3:  HYPERCHOLESTEROLEMIA (ICD-272.0) Assessment: Unchanged At goal.  Her updated medication list for this problem includes:    Lipitor 40 Mg Tabs (Atorvastatin calcium) .Marland Kitchen... 1 by mouth once daily  CHOL: 128 (01/18/2009)   LDL: 51 (01/18/2009)   HDL: 65.00 (01/18/2009)   TG: 58.0 (01/18/2009)  Problem # 4:   HEMATURIA, HX OF (ICD-V13.09) Assessment: New Following with [facility]. Improved now and does not notice any coloured urine now.  Will check CBC today.  Other Orders: TLB-CBC Platelet - w/Differential ([id number]-[id number])  Patient Instructions: 1)  Your physician recommends that you schedule a follow-up appointment in: 1 MONTH 2)  Your physician recommends that you return for lab work [id number]

## 2010-11-02 NOTE — Assessment & Plan Note (Signed)
Summary: LOWER ABD PAIN//BLOOD IN STOOL...AS.   History of Present Illness Visit Type: Follow-up Visit Primary GI MD: Stan Head MD [facility] Primary Provider: Myrtie Neither Chief Complaint: lower abdominal pain History of Present Illness:   Occasional bilateral lower abdominal pain. Usually pre-defecation and resolves with defecation. She had one streak of red blood on a stool after straining one month ago. No other bleeding. She was started on Lyrica for chest and leg pain and a diagnosis of fibromyalgia 3 months ago. It is helping. Using metamucil and prune juice but still has some constipation. Moves her bowels every other day. She will double her prune juice to help improve bowel habits. She never did take Align as recommended last year though she purchased it.   GI Review of Systems    Reports abdominal pain and  chest pain.     Location of  Abdominal pain: lower abdomen.    Denies acid reflux, belching, bloating, dysphagia with liquids, dysphagia with solids, heartburn, loss of appetite, nausea, vomiting, vomiting blood, weight loss, and  weight gain.      Reports change in bowel habits.      Colonoscopy  Procedure date:  07/01/2007  Findings:      Sigmoid diverticulosis    Current Medications (verified): 1)  Lipitor 40 Mg Tabs (Atorvastatin Calcium) .Marland Kitchen.. 1 By Mouth Once Daily 2)  Plavix 75 Mg Tabs (Clopidogrel Bisulfate) .Marland Kitchen.. 1 By Mouth Once Daily 3)  Xalatan 0.005 % Soln (Latanoprost) .... As Needed 4)  Aspirin 81 Mg  Tabs (Aspirin) .Marland Kitchen.. 1 By Mouth Once Daily 5)  Oscal 500/200 D-3 500-200 Mg-Unit Tabs (Calcium-Vitamin D) .... 2 Once Daily 6)  Metamucil Smooth Texture 28.3 % Powd (Psyllium) .Marland Kitchen.. 1 Tablespoon Every Other Day 7)  Centrum Silver  Tabs (Multiple Vitamins-Minerals) .... Take 1 By Mouth Once Daily 8)  Vitamin D 2000 Unit Tabs (Cholecalciferol) .... Take 1 Tablet By Mouth Once A Day 9)  Lyrica 75 Mg Caps (Pregabalin) .... Once A Day 10)  Zetia 10 Mg  Tabs (Ezetimibe) .... Take One Tablet By Mouth Daily. 11)  Probiotic  Caps (Probiotic Product) .... Once Daily  Allergies (verified): No Known Drug Allergies  Past History:  Past Medical History: CAD (ICD-414.00)      a. s/p ptca LAD in 1992      b. s/p cath 1995 - nonobs.      c. s/p myoview 2008:  Apical thinning vs. mild mixed scar & ischemia. HYPERCHOLESTEROLEMIA (ICD-272.0) DEPRESSION (ICD-311) LEUKOPENIA, MILD (ICD-288.50) DIVERTICULOSIS OF COLON  GLAUCOMA ORTHOSTATIC DIZZINESS Fibromyalgia  Past Surgical History: Reviewed history from 01/11/2009 and no changes required. Cervical spine surgery Hysterectomy  coronary angioplasty of the LAD in 1992.    Senile nuclear cataract, right eye-2004  Family History: Reviewed history from 04/18/2009 and no changes required. Family History of Pancreatic Cancer:father Family History of Diabetes: mother  No FH of Colon Cancer:  Social History: Reviewed history from 01/11/2009 and no changes required.  Occupation: retired Patient has never smoked.  Alcohol Use - no Illicit Drug Use - no Patient gets regular exercise.  Vital Signs:  Patient profile:   77 year old female Height:      64 inches Weight:      156 pounds BMI:     26.87 Pulse rate:   60 / minute Pulse rhythm:   regular BP sitting:   106 / 60  (left arm) Cuff size:   regular  Vitals Entered By: June McMurray CMA Duncan Dull) (April 06, 2010 3:45 PM)  Physical Exam  General:  Well developed, well nourished, in no acute distress. Abdomen:  soft, nontender, nondistended, bowel sounds present   Impression & Recommendations:  Problem # 1:  ABDOMINAL PAIN, LEFT LOWER QUADRANT (ICD-789.04) Assessment Unchanged This is related tio defecation and diverticulsosi. Will increase prune juice and possibly metamucil. If unhelpfuul she is to return. She is concerned about ovaries and wanted a CT scan. i told her to discuss ovaries with Dr. Jennette Kettle and that exam or [place] would  be better for those.  Problem # 2:  DIVERTICULOSIS OF COLON (ICD-562.10) Assessment: Unchanged Part of her problems. To increase prune juice and possibly metamucil.  Problem # 3:  RECTAL BLEEDING (ICD-569.3) Assessment: Unchanged Rare ano-rectal bleeding with straining, chronic and unchanged. No plans to investigate further.  Patient Instructions: 1)  Increase prune juice to one glass twice a day always. 2)  You may also need to increase your metamucil to 2 tablespoons a day. 3)  These changes are to help you have bowel movements more often that should help your abdominal pain. 4)  If this does not work please come back to see Dr. Leone Payor 5)  Copy sent to : Corine Shelter, MD, Konrad Dolores, MD 6)  The medication list was reviewed and reconciled.  All changed / newly prescribed medications were explained.  A complete medication list was provided to the patient / caregiver.

## 2010-11-02 NOTE — Assessment & Plan Note (Signed)
Summary: f28m   Visit Type:  Follow-up Referring Provider:  n/a Primary Provider:  Myrtie Neither  CC:  Chest pressure- legs tightness.  History of Present Illness: Patient lives alone.  Gets along ok.  Still sees Dr. Petra Kuba.  She gets tightening in the legs.  She was told she has fibromyalgia. Sometimes she will have some tightness in the chest, but activity seems to help this go away.  She has exercise at church, she gets no tightness in the chest.  At night sometime, she will feel a little thum[, and she wil have some soreness there.    Current Medications (verified): 1)  Lipitor 40 Mg Tabs (Atorvastatin Calcium) .Marland Kitchen.. 1 By Mouth Once Daily 2)  Plavix 75 Mg Tabs (Clopidogrel Bisulfate) .Marland Kitchen.. 1 By Mouth Once Daily 3)  Xalatan 0.005 % Soln (Latanoprost) .... As Needed 4)  Aspirin 81 Mg  Tabs (Aspirin) .Marland Kitchen.. 1 By Mouth Once Daily 5)  Oscal 500/200 D-3 500-200 Mg-Unit Tabs (Calcium-Vitamin D) .... 2 Once Daily 6)  Metamucil Smooth Texture 28.3 % Powd (Psyllium) .... 2  Tablespoon Every Day 7)  Centrum Silver  Tabs (Multiple Vitamins-Minerals) .... Take 1 By Mouth Once Daily 8)  Lyrica 75 Mg Caps (Pregabalin) .... Once A Day 9)  Probiotic  Caps (Probiotic Product) .... Once Daily  Allergies (verified): No Known Drug Allergies  Vital Signs:  Patient profile:   77 year old female Height:      64 inches Weight:      155.50 pounds BMI:     26.79 Pulse rate:   64 / minute Pulse rhythm:   regular Resp:     18 per minute BP sitting:   130 / 70  (left arm) Cuff size:   large  Vitals Entered By: Vikki Ports (July 19, 2010 2:27 PM)  Physical Exam  General:  Well developed, well nourished, in no acute distress. Head:  normocephalic and atraumatic Eyes:  PERRLA/EOM intact; conjunctiva and lids normal. Chest Wall:  tender to touch at site she marked with magic marker.  Similar to last visit.  Chostochondral junction. Lungs:  Clear bilaterally to auscultation and  percussion. Heart:  PMI non displaced.  NOrmal S1 and S2.  No murmur, or rub, or gallop.  Pulses:  pulses normal in all 4 extremities Extremities:  No clubbing or cyanosis. Neurologic:  Alert and oriented x 3.   EKG  Procedure date:  07/19/2010  Findings:      NSR.  Nonspecific ST and T changes.  Impression & Recommendations:  Problem # 1:  CAD (ICD-414.00) Current symptoms are muskuloskeletal in nature.  Tender to touch.  Therapuetic things discussed. Her updated medication list for this problem includes:    Plavix 75 Mg Tabs (Clopidogrel bisulfate) .Marland Kitchen... 1 by mouth once daily    Aspirin 81 Mg Tabs (Aspirin) .Marland Kitchen... 1 by mouth once daily  Her updated medication list for this problem includes:    Plavix 75 Mg Tabs (Clopidogrel bisulfate) .Marland Kitchen... 1 by mouth once daily    Aspirin 81 Mg Tabs (Aspirin) .Marland Kitchen... 1 by mouth once daily  Orders: EKG w/ Interpretation (93000)  Problem # 2:  CHEST PAIN, ATYPICAL (ICD-786.59) see above Her updated medication list for this problem includes:    Plavix 75 Mg Tabs (Clopidogrel bisulfate) .Marland Kitchen... 1 by mouth once daily    Aspirin 81 Mg Tabs (Aspirin) .Marland Kitchen... 1 by mouth once daily  Problem # 3:  HYPERCHOLESTEROLEMIA (ICD-272.0) Needs to have followup with Dr. Petra Kuba regarding labs.  Will forward note to him The following medications were removed from the medication list:    Zetia 10 Mg Tabs (Ezetimibe) .Marland Kitchen... Take one tablet by mouth daily. Her updated medication list for this problem includes:    Lipitor 40 Mg Tabs (Atorvastatin calcium) .Marland Kitchen... 1 by mouth once daily  Patient Instructions: 1)  Your physician recommends that you continue on your current medications as directed. Please refer to the Current Medication list given to you today. 2)  Your physician wants you to follow-up in:6 MONTHS   You will receive a reminder letter in the mail two months in advance. If you don't receive a letter, please call our office to schedule the follow-up  appointment.

## 2010-11-02 NOTE — Assessment & Plan Note (Signed)
Summary: 2 month rov   Referring Provider:  n/a Primary Provider:  Myrtie Neither   History of Present Illness: Erin major concern is a little tightening in Erin legs.  She has a " thump" in Erin left breast.  Sometimes it hurts and is tender.  ccasional dizziness.  No definite chest pain.  We discussed Erin cholesterol in detail.  She was on Zetia before.  Current Medications (verified): 1)  Lipitor 40 Mg Tabs (Atorvastatin Calcium) .Marland Kitchen.. 1 By Mouth Once Daily 2)  Plavix 75 Mg Tabs (Clopidogrel Bisulfate) .Marland Kitchen.. 1 By Mouth Once Daily 3)  Xalatan 0.005 % Soln (Latanoprost) .... As Needed 4)  Aspirin 81 Mg  Tabs (Aspirin) .Marland Kitchen.. 1 By Mouth Once Daily 5)  Oscal 500/200 D-3 500-200 Mg-Unit Tabs (Calcium-Vitamin D) .... 2 Once Daily 6)  Multivitamins   Tabs (Multiple Vitamin) .Marland Kitchen.. 1 By Mouth Once Daily 7)  Metamucil Smooth Texture 28.3 % Powd (Psyllium) .Marland Kitchen.. 1 Tablespoon Every Other Day 8)  Centrum Silver  Tabs (Multiple Vitamins-Minerals) .... Take 1 By Mouth Once Daily  Allergies (verified): No Known Drug Allergies  Vital Signs:  Patient profile:   77 year old female Height:      64 inches Weight:      156 pounds Pulse rate:   78 / minute Resp:     16 per minute BP sitting:   106 / 62  (right arm)  Vitals Entered By: Marrion Coy, CNA (October 28, 2009 2:18 PM)  Physical Exam  General:  Well developed, well nourished, in no acute distress. Lungs:  Clear bilaterally to auscultation and percussion. Heart:  No displacement of PMI.  Normal s1 and S 2.  No rub or murmur.  Msk:  Slight tender L chest.   Extremities:  No edema.  No evid of DVT. Neurologic:  Alert and oriented x 3. a  EKG  Procedure date:  10/28/2009  Findings:      NSR.  Nonspecific ST and T change, improved from prior tracing.  Impression & Recommendations:  Problem # 1:  CHEST PAIN, ATYPICAL (ICD-786.59)  Atypical symptoms.  Tender to palpation.  Nothing to suggest ischemia. Erin updated medication list  for this problem includes:    Plavix 75 Mg Tabs (Clopidogrel bisulfate) .Marland Kitchen... 1 by mouth once daily    Aspirin 81 Mg Tabs (Aspirin) .Marland Kitchen... 1 by mouth once daily  Orders: EKG w/ Interpretation (93000)  Problem # 2:  HYPERCHOLESTEROLEMIA (ICD-272.0)  Talked about a lipitor holiday because of leg symptoms, but she is scared to stop.  Exam not revealing.  Sxs could be related but not clearly.   Erin updated medication list for this problem includes:    Lipitor 40 Mg Tabs (Atorvastatin calcium) .Marland Kitchen... 1 by mouth once daily  Orders: EKG w/ Interpretation (93000)  Problem # 3:  PALPITATIONS (ICD-785.1) She would like an ECG.  Will do at Erin request.  Problem # 4:  CAD (ICD-414.00)  Has prior PCI, no current symptoms to suggest recurrent ischemia.  Continue medical therapy.  Erin updated medication list for this problem includes:    Plavix 75 Mg Tabs (Clopidogrel bisulfate) .Marland Kitchen... 1 by mouth once daily    Aspirin 81 Mg Tabs (Aspirin) .Marland Kitchen... 1 by mouth once daily  Orders: EKG w/ Interpretation (93000)  Patient Instructions: 1)  Your physician recommends that you schedule a follow-up appointment in: 3 MONTHS 2)  Your physician recommends that you continue on your current medications as directed. Please refer to the Current  Medication list given to you today.

## 2010-11-02 NOTE — Assessment & Plan Note (Signed)
Summary: 3 month rov/sl   Visit Type:  3 months follow up  CC:  Some kind of chest discomfort at night once in awhile.  Current Medications (verified): 1)  Lipitor 40 Mg Tabs (Atorvastatin Calcium) .Marland Kitchen.. 1 By Mouth Once Daily 2)  Plavix 75 Mg Tabs (Clopidogrel Bisulfate) .Marland Kitchen.. 1 By Mouth Once Daily 3)  Xalatan 0.005 % Soln (Latanoprost) .... As Needed 4)  Aspirin 81 Mg  Tabs (Aspirin) .Marland Kitchen.. 1 By Mouth Once Daily 5)  Oscal 500/200 D-3 500-200 Mg-Unit Tabs (Calcium-Vitamin D) .... 2 Once Daily 6)  Metamucil Smooth Texture 28.3 % Powd (Psyllium) .Marland Kitchen.. 1 Tablespoon Every Other Day 7)  Centrum Silver  Tabs (Multiple Vitamins-Minerals) .... Take 1 By Mouth Once Daily  Allergies (verified): No Known Drug Allergies  Vital Signs:  Patient profile:   77 year old female Height:      64 inches Weight:      155.50 pounds BMI:     26.79 Pulse rate:   63 / minute Pulse rhythm:   regular Resp:     18 per minute BP sitting:   110 / 75  (left arm) Cuff size:   large  Vitals Entered By: Vikki Ports (Feb 06, 2010 3:14 PM)  The patient left the office before seeing Dr Riley Kill.  The pt had to pick-up her grandson.  Mrs Onofre said she would call back to reschedule her appointment. Julieta Gutting, RN, BSN  Feb 06, 2010 4:38 PM

## 2010-11-02 NOTE — Medication Information (Signed)
Summary: rov/sl  Anticoagulant Therapy  Managed by: Weston Brass, PharmD Referring MD: Riley Kill MD, Maisie Fus PCP: Myrtie Neither Supervising MD: Juanda Chance MD, Mahkai Fangman Indication 1: Pulmonary Embolism Lab Used: [facility] [place] Site: [address] INR POC 2.3 INR RANGE 2.0-3.0  Dietary changes: no    Health status changes: no    Bleeding/hemorrhagic complications: no    Recent/future hospitalizations: no    Any changes in medication regimen? no    Recent/future dental: no  Any missed doses?: no       Is patient compliant with meds? yes       Allergies: No Known Drug Allergies  Anticoagulation Management History:      The patient is taking warfarin and comes in today for a routine follow up visit.  Positive risk factors for bleeding include an age of 75 years or older.  The bleeding index is 'intermediate risk'.  Positive CHADS2 values include Age > 65 years old.  Anticoagulation responsible provider: Juanda Chance MD, Smitty Cords.  INR POC: 2.3.  Cuvette Lot#: [id number].  Exp: 07/2011.    Anticoagulation Management Assessment/Plan:      The patient's current anticoagulation dose is Warfarin sodium 5 mg tabs: Use as directed by Anticoagulation Clinic.  The target INR is 2.0-3.0.  The next INR is due 10/09/2010.  Anticoagulation instructions were given to patient.  Results were reviewed/authorized by Weston Brass, PharmD.  She was notified by Weston Brass PharmD.         Prior Anticoagulation Instructions: INR 2.9  Continue taking Coumadin 1 tab (5 mg) every day. Return to clinic in 3 weeks.   Current Anticoagulation Instructions: INR 2.3  Continue same dose of 1 tablet every day.  Recheck INR in 4 weeks.

## 2010-11-02 NOTE — Medication Information (Signed)
Summary: per Dayna the PA dx: PE  Anticoagulant Therapy  Managed by: Bethena Midget, RN, BSN Referring MD: Riley Kill MD, Maisie Fus PCP: Myrtie Neither Supervising MD: Myrtis Ser MD, Tinnie Gens Indication 1: Pulmonary Embolism Lab Used: [facility] [place] Site: [address] INR POC 2.2 INR RANGE 2.0-3.0  Dietary changes: no    Health status changes: no    Bleeding/hemorrhagic complications: no    Recent/future hospitalizations: no    Any changes in medication regimen? no    Recent/future dental: no  Any missed doses?: no       Is patient compliant with meds? yes      Comments: New pt education completed started coumadin on 07/28/10 in hospital   Current Medications (verified): 1)  Lipitor 40 Mg Tabs (Atorvastatin Calcium) .Marland Kitchen.. 1 By Mouth Once Daily 2)  Xalatan 0.005 % Soln (Latanoprost) .Marland Kitchen.. 1 Gtt in Both Eyes 3)  Aspirin 81 Mg  Tabs (Aspirin) .Marland Kitchen.. 1 By Mouth Once Daily 4)  Oscal 500/200 D-3 500-200 Mg-Unit Tabs (Calcium-Vitamin D) .Marland Kitchen.. 1 Once Daily 5)  Metamucil Smooth Texture 28.3 % Powd (Psyllium) .... 2  Tablespoon Every Day 6)  Centrum Silver  Tabs (Multiple Vitamins-Minerals) .... Take 1 By Mouth Once Daily 7)  Lyrica 75 Mg Caps (Pregabalin) .... Once A Day As Needed 8)  Probiotic  Caps (Probiotic Product) .... Once Daily 9)  Warfarin Sodium 5 Mg Tabs (Warfarin Sodium) .... Use As Directed By Anticoagulation Clinic 10)  Nitrostat 0.4 Mg Subl (Nitroglycerin) .Marland Kitchen.. 1 Tablet Under Tongue At Onset of Chest Pain; You May Repeat Every 5 Minutes For Up To 3 Doses. 11)  Dulcolax 5 Mg Tbec (Bisacodyl) .... As Needed 12)  Zolpidem Tartrate 6.25 Mg Cr-Tabs (Zolpidem Tartrate) .... As Needed At Bedtime  Allergies (verified): No Known Drug Allergies  Anticoagulation Management History:      The patient comes in today for her initial visit for anticoagulation therapy.  Positive risk factors for bleeding include an age of 75 years or older.  The bleeding index is  'intermediate risk'.  Positive CHADS2 values include Age > 65 years old.  Anticoagulation responsible provider: Myrtis Ser MD, Tinnie Gens.  INR POC: 2.2.  Cuvette Lot#: [id number].  Exp: 08/2011.    Anticoagulation Management Assessment/Plan:      The patient's current anticoagulation dose is Warfarin sodium 5 mg tabs: Use as directed by Anticoagulation Clinic.  The next INR is due 08/11/2010.  Anticoagulation instructions were given to patient.  Results were reviewed/authorized by Bethena Midget, RN, BSN.  She was notified by Bethena Midget, RN, BSN.         Current Anticoagulation Instructions: INR 2.2 Continue taking 1 pill each day. Recheck in one week.

## 2010-11-02 NOTE — Medication Information (Signed)
Summary: rov/tm  Anticoagulant Therapy  Managed by: Lyna Poser, PharmD Referring MD: Riley Kill MD, Maisie Fus PCP: Myrtie Neither Supervising MD: Excell Seltzer MD, Casimiro Needle Indication 1: Pulmonary Embolism Lab Used: [facility] [place] Site: [address] INR POC 1.7 INR RANGE 2.0-3.0  Dietary changes: no    Health status changes: no    Bleeding/hemorrhagic complications: no    Recent/future hospitalizations: no    Any changes in medication regimen? no    Recent/future dental: no  Any missed doses?: yes     Details: missed 1 dose. She thinks it was before the INR check on the 11th but can't remember what day.  Is patient compliant with meds? yes       Allergies: No Known Drug Allergies  Anticoagulation Management History:      The patient is taking warfarin and comes in today for a routine follow up visit.  Positive risk factors for bleeding include an age of 75 years or older.  The bleeding index is 'intermediate risk'.  Positive CHADS2 values include Age > 70 years old.  Anticoagulation responsible provider: Excell Seltzer MD, Casimiro Needle.  INR POC: 1.7.  Cuvette Lot#: [id number].  Exp: 09/2011.    Anticoagulation Management Assessment/Plan:      The patient's current anticoagulation dose is Warfarin sodium 5 mg tabs: Use as directed by Anticoagulation Clinic.  The next INR is due 08/22/2010.  Anticoagulation instructions were given to patient.  Results were reviewed/authorized by Lyna Poser, PharmD.         Prior Anticoagulation Instructions: INR 2.3 Continue 1 pill everyday. Recheck in one week.   Current Anticoagulation Instructions: INR 1.7 Today take 1.5 tablets. Then resume normal dosing schedule of 1 tablet everyday. Recheck in 1 week.

## 2010-11-02 NOTE — Assessment & Plan Note (Signed)
Summary: f80m   Visit Type:  1 month follow up Referring Provider:  n/a Primary Provider:  Myrtie Neither  CC:  No complaints.  History of Present Illness: Patient is in for follow up.  She is doing pretty well.  She has no shortness of breath.  She denies any more chest pain.  She is now stable on her coumadin.  She is stable overall.  INRs have been stable.  Problems Prior to Update: 1)  Hematuria, Hx of  (ICD-V13.09) 2)  Pulmonary Embolism  (ICD-415.19) 3)  Palpitations  (ICD-785.1) 4)  Orthostatic Dizziness  (ICD-780.4) 5)  Chest Pain, Atypical  (ICD-786.59) 6)  Unsteady Gait  (ICD-781.2) 7)  Cad  (ICD-414.00) 8)  Hypercholesterolemia  (ICD-272.0) 9)  Obesity, Mild  (ICD-278.02) 10)  Depression  (ICD-311) 11)  Unspecified Cardiovascular Disease  (ICD-429.2) 12)  Iron Deficiency  (ICD-280.9) 13)  Rectal Bleeding  (ICD-569.3) 14)  Abdominal Pain, Left Lower Quadrant  (ICD-789.04) 15)  Leukopenia, Mild  (ICD-288.50) 16)  Urinary Hesitancy  (ICD-788.64) 17)  Diverticulosis of Colon  (ICD-562.10)  Current Medications (verified): 1)  Lipitor 40 Mg Tabs (Atorvastatin Calcium) .Marland Kitchen.. 1 By Mouth Once Daily 2)  Latanoprost 0.005 % Soln (Latanoprost) .... One Drop Each Eye At Bedtime 3)  Aspirin 81 Mg  Tabs (Aspirin) .Marland Kitchen.. 1 By Mouth Once Daily 4)  Oscal 500/200 D-3 500-200 Mg-Unit Tabs (Calcium-Vitamin D) .Marland Kitchen.. 1 Once Daily 5)  Metamucil Smooth Texture 28.3 % Powd (Psyllium) .... 2  Tablespoon Every Day 6)  Centrum Silver  Tabs (Multiple Vitamins-Minerals) .... Take 1 By Mouth Once Daily 7)  Lyrica 75 Mg Caps (Pregabalin) .... Once A Day As Needed 8)  Probiotic  Caps (Probiotic Product) .... Once Daily 9)  Warfarin Sodium 5 Mg Tabs (Warfarin Sodium) .... Use As Directed By Anticoagulation Clinic 10)  Nitrostat 0.4 Mg Subl (Nitroglycerin) .Marland Kitchen.. 1 Tablet Under Tongue At Onset of Chest Pain; You May Repeat Every 5 Minutes For Up To 3 Doses. 11)  Dulcolax 5 Mg Tbec (Bisacodyl) .... As  Needed 12)  Zolpidem Tartrate 6.25 Mg Cr-Tabs (Zolpidem Tartrate) .... As Needed At Bedtime  Allergies (verified): No Known Drug Allergies  Vital Signs:  Patient profile:   77 year old female Height:      64 inches Weight:      155 pounds BMI:     26.70 Pulse rate:   72 / minute Pulse rhythm:   regular Resp:     18 per minute BP sitting:   100 / 60  (left arm) Cuff size:   large  Vitals Entered By: Vikki Ports (September 15, 2010 3:05 PM)  Physical Exam  General:  Well developed, well nourished, in no acute distress. Head:  normocephalic and atraumatic Eyes:  PERRLA/EOM intact; conjunctiva and lids normal. Lungs:  Clear bilaterally to auscultation and percussion. Heart:  PMI nondisplaced.  No murmur.  Normal S1 and S2.  Without rub.  S4 gallop. Pulses:  pulses normal in all 4 extremities Extremities:  No clubbing or cyanosis. Neurologic:  Alert and oriented x 3.   CT Scan  Procedure date:  07/26/2010  Findings:       Comparison:  Plain film chest 07/24/2010.    Findings:  Study is positive for pulmonary embolus with clot   identified at the bifurcation of the left main pulmonary artery and   descending right interlobar artery.  Additional smaller clots are   seen.  There is no pleural or pericardial effusion.  Heart  size is   upper normal.  No axillary, hilar or mediastinal lymphadenopathy.   Lungs demonstrate some atelectasis or scar in the lingula but are   otherwise unremarkable.  Incidentally imaged upper abdomen appears   normal.  No focal bony abnormality.    Review of the MIP images confirms the above findings.  Impression & Recommendations:  Problem # 1:  PULMONARY EMBOLISM (ICD-415.19) Stabloe on coumadin at this point in time.  Keep INR in normal Range.. Determination of duration unknown.  Leaning would be longer rather than shorter.  Her updated medication list for this problem includes:    Aspirin 81 Mg Tabs (Aspirin) .Marland Kitchen... 1 by mouth once daily     Warfarin Sodium 5 Mg Tabs (Warfarin sodium) ..... Use as directed by anticoagulation clinic  Problem # 2:  HEMATURIA, HX OF (ICD-V13.09) no gross hematuria at this point in time.  Will need to get a ua study, and she should see urology.  However, she has a urologist who now is in [place].    Problem # 3:  CAD (ICD-414.00) no recurrence at time of cath.  Her updated medication list for this problem includes:    Aspirin 81 Mg Tabs (Aspirin) .Marland Kitchen... 1 by mouth once daily    Warfarin Sodium 5 Mg Tabs (Warfarin sodium) ..... Use as directed by anticoagulation clinic    Nitrostat 0.4 Mg Subl (Nitroglycerin) .Marland Kitchen... 1 tablet under tongue at onset of chest pain; you may repeat every 5 minutes for up to 3 doses.  Patient Instructions: 1)  Your physician recommends that you schedule a follow-up appointment in: 2 MONTHS.  2)  Your physician recommends that you continue on your current medications as directed. Please refer to the Current Medication list given to you today.

## 2010-11-13 ENCOUNTER — Encounter: Payer: Self-pay | Admitting: Cardiology

## 2010-11-13 ENCOUNTER — Encounter (INDEPENDENT_AMBULATORY_CARE_PROVIDER_SITE_OTHER): Payer: Medicare Other

## 2010-11-13 DIAGNOSIS — I2699 Other pulmonary embolism without acute cor pulmonale: Secondary | ICD-10-CM

## 2010-11-13 DIAGNOSIS — Z7901 Long term (current) use of anticoagulants: Secondary | ICD-10-CM

## 2010-11-13 LAB — CONVERTED CEMR LAB: POC INR: 2.2

## 2010-11-18 DIAGNOSIS — Z7901 Long term (current) use of anticoagulants: Secondary | ICD-10-CM | POA: Insufficient documentation

## 2010-11-18 DIAGNOSIS — I2699 Other pulmonary embolism without acute cor pulmonale: Secondary | ICD-10-CM

## 2010-11-21 ENCOUNTER — Other Ambulatory Visit: Payer: Self-pay | Admitting: Cardiology

## 2010-11-21 ENCOUNTER — Ambulatory Visit (INDEPENDENT_AMBULATORY_CARE_PROVIDER_SITE_OTHER): Payer: Medicare Other | Admitting: Cardiology

## 2010-11-21 ENCOUNTER — Encounter: Payer: Self-pay | Admitting: Cardiology

## 2010-11-21 DIAGNOSIS — I2699 Other pulmonary embolism without acute cor pulmonale: Secondary | ICD-10-CM

## 2010-11-21 DIAGNOSIS — I2510 Atherosclerotic heart disease of native coronary artery without angina pectoris: Secondary | ICD-10-CM

## 2010-11-21 DIAGNOSIS — R42 Dizziness and giddiness: Secondary | ICD-10-CM

## 2010-11-21 DIAGNOSIS — Z87448 Personal history of other diseases of urinary system: Secondary | ICD-10-CM

## 2010-11-21 DIAGNOSIS — R0789 Other chest pain: Secondary | ICD-10-CM

## 2010-11-22 LAB — CBC WITH DIFFERENTIAL/PLATELET
Eosinophils Relative: 2.7 % (ref 0.0–5.0)
HCT: 37.8 % (ref 36.0–46.0)
Hemoglobin: 13.1 g/dL (ref 12.0–15.0)
Lymphs Abs: 1.5 10*3/uL (ref 0.7–4.0)
MCV: 96.4 fl (ref 78.0–100.0)
Monocytes Relative: 5.1 % (ref 3.0–12.0)
Neutro Abs: 2.2 10*3/uL (ref 1.4–7.7)
RDW: 14 % (ref 11.5–14.6)
WBC: 4.1 10*3/uL — ABNORMAL LOW (ref 4.5–10.5)

## 2010-11-22 LAB — URINALYSIS
Bilirubin Urine: NEGATIVE
Hgb urine dipstick: NEGATIVE
Ketones, ur: NEGATIVE
Urine Glucose: NEGATIVE
Urobilinogen, UA: 0.2 (ref 0.0–1.0)

## 2010-11-22 LAB — BASIC METABOLIC PANEL
CO2: 30 mEq/L (ref 19–32)
Calcium: 9.3 mg/dL (ref 8.4–10.5)
Creatinine, Ser: 0.9 mg/dL (ref 0.4–1.2)
Glucose, Bld: 65 mg/dL — ABNORMAL LOW (ref 70–99)

## 2010-11-22 NOTE — Medication Information (Signed)
Summary: Coumadin Clinic  Anticoagulant Therapy  Managed by: Georgina Pillion, PharmD Referring MD: Riley Kill MD, Maisie Fus PCP: Myrtie Neither Supervising MD: Daleen Squibb MD, Maisie Fus Indication 1: Pulmonary Embolism Lab Used: [facility] [place] Site: [address] INR POC 2.2 INR RANGE 2.0-3.0  Dietary changes: yes       Details: Eating less greens  Health status changes: no    Bleeding/hemorrhagic complications: no    Recent/future hospitalizations: no    Any changes in medication regimen? no    Recent/future dental: no  Any missed doses?: no       Is patient compliant with meds? yes       Allergies: No Known Drug Allergies  Anticoagulation Management History:      Positive risk factors for bleeding include an age of 75 years or older.  The bleeding index is 'intermediate risk'.  Positive CHADS2 values include Age > 75 years old.  Anticoagulation responsible provider: Daleen Squibb MD, Maisie Fus.  INR POC: 2.2.  Cuvette Lot#: [id number].  Exp: 10/2011.    Anticoagulation Management Assessment/Plan:      The patient's current anticoagulation dose is Warfarin sodium 5 mg tabs: Use as directed by Anticoagulation Clinic.  The target INR is 2.0-3.0.  The next INR is due 12/11/2010.  Anticoagulation instructions were given to patient.  Results were reviewed/authorized by Georgina Pillion, PharmD.         Prior Anticoagulation Instructions: Continue taking 5 mg (1 tablet) daily.  INR 2.4  Current Anticoagulation Instructions: Continue taking 1 tablet (5 mg) daily.  INR 2.2

## 2010-11-22 NOTE — Letter (Signed)
Summary: [facility] Office Visit   [facility] Office Visit   Imported By: Roderic Ovens 11/16/2010 10:07:21  _____________________________________________________________________  External Attachment:    Type:   Image     Comment:   External Document

## 2010-12-04 ENCOUNTER — Telehealth (INDEPENDENT_AMBULATORY_CARE_PROVIDER_SITE_OTHER): Payer: Self-pay | Admitting: *Deleted

## 2010-12-07 NOTE — Assessment & Plan Note (Signed)
Summary: f3m   Referring Provider:  n/a Primary Provider:  Myrtie Neither  CC:  pt complains of dizziness in the mornings and chest tightness.  History of Present Illness: Overall doing well.  Has an occasional episode of chest pain in upper left.  INR has been therapeutic, and her last recent cath showed no sig obstruction.  The symptoms are what they were before, but not progressed in any way.  Has occasional dizziness when she stands up.  Overall, well.  Problems Prior to Update: 1)  Hematuria, Hx of  (ICD-V13.09) 2)  Pulmonary Embolism  (ICD-415.19) 3)  Palpitations  (ICD-785.1) 4)  Orthostatic Dizziness  (ICD-780.4) 5)  Chest Pain, Atypical  (ICD-786.59) 6)  Unsteady Gait  (ICD-781.2) 7)  Cad  (ICD-414.00) 8)  Hypercholesterolemia  (ICD-272.0) 9)  Obesity, Mild  (ICD-278.02) 10)  Depression  (ICD-311) 11)  Unspecified Cardiovascular Disease  (ICD-429.2) 12)  Iron Deficiency  (ICD-280.9) 13)  Rectal Bleeding  (ICD-569.3) 14)  Abdominal Pain, Left Lower Quadrant  (ICD-789.04) 15)  Leukopenia, Mild  (ICD-288.50) 16)  Urinary Hesitancy  (ICD-788.64) 17)  Diverticulosis of Colon  (ICD-562.10)  Current Medications (verified): 1)  Lipitor 40 Mg Tabs (Atorvastatin Calcium) .Marland Kitchen.. 1 By Mouth Once Daily 2)  Latanoprost 0.005 % Soln (Latanoprost) .... One Drop Each Eye At Bedtime 3)  Aspirin 81 Mg  Tabs (Aspirin) .Marland Kitchen.. 1 By Mouth Once Daily 4)  Oscal 500/200 D-3 500-200 Mg-Unit Tabs (Calcium-Vitamin D) .Marland Kitchen.. 1 Once Daily 5)  Metamucil Smooth Texture 28.3 % Powd (Psyllium) .... 2  Tablespoon Every Day 6)  Centrum Silver  Tabs (Multiple Vitamins-Minerals) .... Take 1 By Mouth Once Daily 7)  Lyrica 75 Mg Caps (Pregabalin) .... Once A Day As Needed 8)  Probiotic  Caps (Probiotic Product) .... Once Daily 9)  Warfarin Sodium 5 Mg Tabs (Warfarin Sodium) .... Use As Directed By Anticoagulation Clinic 10)  Nitrostat 0.4 Mg Subl (Nitroglycerin) .Marland Kitchen.. 1 Tablet Under Tongue At Onset of Chest  Pain; You May Repeat Every 5 Minutes For Up To 3 Doses. 11)  Dulcolax 5 Mg Tbec (Bisacodyl) .... As Needed 12)  Zolpidem Tartrate 6.25 Mg Cr-Tabs (Zolpidem Tartrate) .... As Needed At Bedtime  Allergies: No Known Drug Allergies  Past History:  Past Medical History: Last updated: 04/06/2010 CAD (ICD-414.00)      a. s/p ptca LAD in 1992      b. s/p cath 1995 - nonobs.      c. s/p myoview 2008:  Apical thinning vs. mild mixed scar & ischemia. HYPERCHOLESTEROLEMIA (ICD-272.0) DEPRESSION (ICD-311) LEUKOPENIA, MILD (ICD-288.50) DIVERTICULOSIS OF COLON  GLAUCOMA ORTHOSTATIC DIZZINESS Fibromyalgia  Vital Signs:  Patient profile:   77 year old female Height:      64 inches Weight:      158 pounds BMI:     27.22 Pulse rate:   85 / minute Pulse (ortho):   90 / minute Resp:     18 per minute BP sitting:   109 / 65  (left arm) BP standing:   96 / 61  Vitals Entered By: Kem Parkinson (November 21, 2010 3:38 PM)  Serial Vital Signs/Assessments:  Time      Position  BP       Pulse  Resp  Temp     By           Lying RA  104/61   80                    Kimalexis  Barnes           Sitting   109/61   83                    Kimalexis Barnes           Standing  96/61    90                    Kimalexis Barnes   Physical Exam  General:  Well developed, well nourished, in no acute distress. Head:  normocephalic and atraumatic Eyes:  PERRLA/EOM intact; conjunctiva and lids normal. Lungs:  Clear bilaterally to auscultation and percussion. Heart:  PMI nondisplaced. Normal S1 and S2.  No murmur, rub, or gallop. Abdomen:  Bowel sounds positive; abdomen soft and non-tender without masses, organomegaly, or hernias noted. No hepatosplenomegaly. Pulses:  pulses normal in all 4 extremities Extremities:  No clubbing or cyanosis. Neurologic:  Alert and oriented x 3.   EKG  Procedure date:  11/21/2010  Findings:      NSR.  Non specific ST and T wave changes.    Impression &  Recommendations:  Problem # 1:  CHEST PAIN, ATYPICAL (ICD-786.59)  Last recent cath wihtout significant progression.  PCI in 1992.  See Juanda Chance cath report. Her symptoms do not seem particularly progressed.  Will continue current medications.   The following medications were removed from the medication list:    Aspirin 81 Mg Tabs (Aspirin) .Marland Kitchen... 1 by mouth once daily Her updated medication list for this problem includes:    Warfarin Sodium 5 Mg Tabs (Warfarin sodium) ..... Use as directed by anticoagulation clinic    Nitrostat 0.4 Mg Subl (Nitroglycerin) .Marland Kitchen... 1 tablet under tongue at onset of chest pain; you may repeat every 5 minutes for up to 3 doses.  The following medications were removed from the medication list:    Aspirin 81 Mg Tabs (Aspirin) .Marland Kitchen... 1 by mouth once daily Her updated medication list for this problem includes:    Warfarin Sodium 5 Mg Tabs (Warfarin sodium) ..... Use as directed by anticoagulation clinic    Nitrostat 0.4 Mg Subl (Nitroglycerin) .Marland Kitchen... 1 tablet under tongue at onset of chest pain; you may repeat every 5 minutes for up to 3 doses.  Problem # 2:  PULMONARY EMBOLISM (ICD-415.19) Her INR has been therapeutic.  Will stop ASA given cath findings.  Check CBC. The following medications were removed from the medication list:    Aspirin 81 Mg Tabs (Aspirin) .Marland Kitchen... 1 by mouth once daily Her updated medication list for this problem includes:    Warfarin Sodium 5 Mg Tabs (Warfarin sodium) ..... Use as directed by anticoagulation clinic  Problem # 3:  HEMATURIA, HX OF (ICD-V13.09) see uro note.  Notices some dark urine at times, so will check UA. Orders: EKG w/ Interpretation (93000) TLB-CBC Platelet - w/Differential (85025-CBCD) TLB-BMP (Basic Metabolic Panel-BMET) (80048-METABOL) TLB-Udip ONLY (81003-UDIP)  Problem # 4:  ORTHOSTATIC DIZZINESS (ICD-780.4) see BP reports.   Patient Instructions: 1)  Your physician recommends that you schedule a follow-up  appointment in: 3months with Dr. Riley Kill 2)  Your physician recommends that you have lab work today 3)  Your physician has recommended you make the following change in your medication: STOP Aspirin 4)  Remember to drink plenty of fluids ( water) and stay well hydrated. 5)  Take your time getting out of bed and standing.  Get up slowly.

## 2010-12-11 ENCOUNTER — Encounter: Payer: Self-pay | Admitting: Cardiology

## 2010-12-11 ENCOUNTER — Encounter (INDEPENDENT_AMBULATORY_CARE_PROVIDER_SITE_OTHER): Payer: Medicare Other

## 2010-12-11 DIAGNOSIS — Z7901 Long term (current) use of anticoagulants: Secondary | ICD-10-CM

## 2010-12-11 DIAGNOSIS — I2699 Other pulmonary embolism without acute cor pulmonale: Secondary | ICD-10-CM

## 2010-12-11 LAB — CONVERTED CEMR LAB: POC INR: 2.8

## 2010-12-12 LAB — PROTIME-INR
INR: 2.3 — ABNORMAL HIGH (ref 0.00–1.49)
INR: 2.33 — ABNORMAL HIGH (ref 0.00–1.49)
Prothrombin Time: 25.4 seconds — ABNORMAL HIGH (ref 11.6–15.2)
Prothrombin Time: 25.7 seconds — ABNORMAL HIGH (ref 11.6–15.2)

## 2010-12-12 LAB — CBC
HCT: 34.8 % — ABNORMAL LOW (ref 36.0–46.0)
Hemoglobin: 11.8 g/dL — ABNORMAL LOW (ref 12.0–15.0)
MCH: 32.2 pg (ref 26.0–34.0)
MCHC: 33.9 g/dL (ref 30.0–36.0)
MCV: 95.1 fL (ref 78.0–100.0)
Platelets: 172 10*3/uL (ref 150–400)
RDW: 13.1 % (ref 11.5–15.5)
RDW: 13.3 % (ref 11.5–15.5)
WBC: 4.3 10*3/uL (ref 4.0–10.5)

## 2010-12-12 NOTE — Progress Notes (Signed)
Summary: Records Request  Faxed Labs to [facility] at Dr. Greggory Stallion [facility] ([phone]). Debby Freiberg  December 04, 2010 1:33 PM

## 2010-12-13 LAB — APTT: aPTT: 131 seconds — ABNORMAL HIGH (ref 24–37)

## 2010-12-13 LAB — CBC
HCT: 33.7 % — ABNORMAL LOW (ref 36.0–46.0)
HCT: 33.9 % — ABNORMAL LOW (ref 36.0–46.0)
HCT: 34.1 % — ABNORMAL LOW (ref 36.0–46.0)
HCT: 36.9 % (ref 36.0–46.0)
HCT: 37 % (ref 36.0–46.0)
Hemoglobin: 11.5 g/dL — ABNORMAL LOW (ref 12.0–15.0)
Hemoglobin: 11.7 g/dL — ABNORMAL LOW (ref 12.0–15.0)
Hemoglobin: 12.6 g/dL (ref 12.0–15.0)
MCH: 31.9 pg (ref 26.0–34.0)
MCH: 32.4 pg (ref 26.0–34.0)
MCH: 32.5 pg (ref 26.0–34.0)
MCHC: 33.4 g/dL (ref 30.0–36.0)
MCHC: 34.1 g/dL (ref 30.0–36.0)
MCHC: 34.3 g/dL (ref 30.0–36.0)
MCV: 94.9 fL (ref 78.0–100.0)
MCV: 95.4 fL (ref 78.0–100.0)
Platelets: 176 10*3/uL (ref 150–400)
Platelets: 180 10*3/uL (ref 150–400)
RBC: 3.61 MIL/uL — ABNORMAL LOW (ref 3.87–5.11)
RBC: 4.01 MIL/uL (ref 3.87–5.11)
RDW: 12.8 % (ref 11.5–15.5)
RDW: 12.8 % (ref 11.5–15.5)
RDW: 12.8 % (ref 11.5–15.5)
RDW: 12.9 % (ref 11.5–15.5)
RDW: 12.9 % (ref 11.5–15.5)
WBC: 3.2 10*3/uL — ABNORMAL LOW (ref 4.0–10.5)
WBC: 3.9 10*3/uL — ABNORMAL LOW (ref 4.0–10.5)
WBC: 4.6 10*3/uL (ref 4.0–10.5)
WBC: 4.8 10*3/uL (ref 4.0–10.5)
WBC: 4.8 10*3/uL (ref 4.0–10.5)

## 2010-12-13 LAB — CARDIAC PANEL(CRET KIN+CKTOT+MB+TROPI)
CK, MB: 1.4 ng/mL (ref 0.3–4.0)
Relative Index: INVALID (ref 0.0–2.5)
Relative Index: INVALID (ref 0.0–2.5)
Total CK: 63 U/L (ref 7–177)
Total CK: 73 U/L (ref 7–177)
Troponin I: 0.27 ng/mL — ABNORMAL HIGH (ref 0.00–0.06)

## 2010-12-13 LAB — URINE MICROSCOPIC-ADD ON

## 2010-12-13 LAB — LIPID PANEL
Cholesterol: 143 mg/dL (ref 0–200)
LDL Cholesterol: 70 mg/dL (ref 0–99)
Triglycerides: 72 mg/dL (ref <150)

## 2010-12-13 LAB — BASIC METABOLIC PANEL
BUN: 8 mg/dL (ref 6–23)
Calcium: 8.9 mg/dL (ref 8.4–10.5)
Creatinine, Ser: 0.94 mg/dL (ref 0.4–1.2)
Creatinine, Ser: 0.98 mg/dL (ref 0.4–1.2)
GFR calc Af Amer: >60 mL/min (ref >60)
GFR calc Af Amer: >60 mL/min (ref >60)
GFR calc non Af Amer: 57 mL/min — ABNORMAL LOW (ref >60)
Potassium: 4.2 mEq/L (ref 3.5–5.1)

## 2010-12-13 LAB — URINALYSIS, ROUTINE W REFLEX MICROSCOPIC
Glucose, UA: NEGATIVE mg/dL
Protein, ur: 30 mg/dL — AB
pH: 6 (ref 5.0–8.0)

## 2010-12-13 LAB — PROTIME-INR
INR: 1.04 (ref 0.00–1.49)
INR: 1.06 (ref 0.00–1.49)
INR: 1.15 (ref 0.00–1.49)
INR: 1.17 (ref 0.00–1.49)
Prothrombin Time: 13.8 seconds (ref 11.6–15.2)
Prothrombin Time: 14.9 seconds (ref 11.6–15.2)
Prothrombin Time: 16.5 seconds — ABNORMAL HIGH (ref 11.6–15.2)

## 2010-12-13 LAB — CK TOTAL AND CKMB (NOT AT ARMC)
CK, MB: 1.2 ng/mL (ref 0.3–4.0)
Relative Index: INVALID (ref 0.0–2.5)
Total CK: 45 U/L (ref 7–177)

## 2010-12-13 LAB — COMPREHENSIVE METABOLIC PANEL
Albumin: 3.2 g/dL — ABNORMAL LOW (ref 3.5–5.2)
BUN: 8 mg/dL (ref 6–23)
Calcium: 8.9 mg/dL (ref 8.4–10.5)
Glucose, Bld: 95 mg/dL (ref 70–99)
Total Protein: 6.2 g/dL (ref 6.0–8.3)

## 2010-12-13 LAB — HEPARIN LEVEL (UNFRACTIONATED)
Heparin Unfractionated: 0.33 IU/mL (ref 0.30–0.70)
Heparin Unfractionated: 0.51 IU/mL (ref 0.30–0.70)
Heparin Unfractionated: 0.54 IU/mL (ref 0.30–0.70)
Heparin Unfractionated: 0.98 IU/mL — ABNORMAL HIGH (ref 0.30–0.70)
Heparin Unfractionated: 1.55 IU/mL — ABNORMAL HIGH (ref 0.30–0.70)
Heparin Unfractionated: 1.74 IU/mL — ABNORMAL HIGH (ref 0.30–0.70)

## 2010-12-13 LAB — TROPONIN I: Troponin I: 0.07 ng/mL — ABNORMAL HIGH (ref 0.00–0.06)

## 2010-12-13 LAB — MRSA PCR SCREENING: MRSA by PCR: NEGATIVE

## 2010-12-13 LAB — PLATELET INHIBITION P2Y12: Platelet Function  P2Y12: 301 [PRU] (ref 194–418)

## 2010-12-19 NOTE — Medication Information (Signed)
Summary: rov/pc  Anticoagulant Therapy  Managed by: Windell Hummingbird, RN Referring MD: Riley Kill MD, Maisie Fus PCP: Myrtie Neither Supervising MD: Riley Kill MD, Maisie Fus Indication 1: Pulmonary Embolism Lab Used: [facility] [place] Site: [address] INR POC 2.8 INR RANGE 2.0-3.0  Dietary changes: no    Health status changes: no    Bleeding/hemorrhagic complications: no    Recent/future hospitalizations: no    Any changes in medication regimen? no    Recent/future dental: no  Any missed doses?: no       Is patient compliant with meds? yes       Allergies: No Known Drug Allergies  Anticoagulation Management History:      The patient is taking warfarin and comes in today for a routine follow up visit.  Positive risk factors for bleeding include an age of 75 years or older.  The bleeding index is 'intermediate risk'.  Positive CHADS2 values include Age > 25 years old.  Anticoagulation responsible provider: Riley Kill MD, Maisie Fus.  INR POC: 2.8.  Cuvette Lot#: [id number].  Exp: 11/2011.    Anticoagulation Management Assessment/Plan:      The patient's current anticoagulation dose is Warfarin sodium 5 mg tabs: Use as directed by Anticoagulation Clinic.  The target INR is 2.0-3.0.  The next INR is due 01/08/2011.  Anticoagulation instructions were given to patient.  Results were reviewed/authorized by Windell Hummingbird, RN.  She was notified by Windell Hummingbird, RN.         Prior Anticoagulation Instructions: Continue taking 1 tablet (5 mg) daily.  INR 2.2  Current Anticoagulation Instructions: INR 2.8 Continue taking 1 tablet every day.   Recheck in 4 weeks.

## 2010-12-21 LAB — COMPREHENSIVE METABOLIC PANEL
ALT: 22 U/L (ref 0–35)
ALT: 23 U/L (ref 0–35)
Albumin: 3.9 g/dL (ref 3.5–5.2)
Alkaline Phosphatase: 57 U/L (ref 39–117)
Alkaline Phosphatase: 68 U/L (ref 39–117)
CO2: 23 mEq/L (ref 19–32)
Calcium: 9.2 mg/dL (ref 8.4–10.5)
GFR calc Af Amer: >60 mL/min (ref >60)
Glucose, Bld: 108 mg/dL — ABNORMAL HIGH (ref 70–99)
Potassium: 3.6 mEq/L (ref 3.5–5.1)
Potassium: 4.6 mEq/L (ref 3.5–5.1)
Sodium: 137 mEq/L (ref 135–145)
Sodium: 138 mEq/L (ref 135–145)
Total Protein: 6.1 g/dL (ref 6.0–8.3)
Total Protein: 7.4 g/dL (ref 6.0–8.3)

## 2010-12-21 LAB — URINE CULTURE

## 2010-12-21 LAB — CARDIAC PANEL(CRET KIN+CKTOT+MB+TROPI)
CK, MB: 1.9 ng/mL (ref 0.3–4.0)
CK, MB: 2.4 ng/mL (ref 0.3–4.0)
Total CK: 79 U/L (ref 7–177)

## 2010-12-21 LAB — POCT CARDIAC MARKERS
CKMB, poc: <1 ng/mL — ABNORMAL LOW (ref 1.0–8.0)
Troponin i, poc: <0.05 ng/mL (ref 0.00–0.09)

## 2010-12-21 LAB — URINALYSIS, ROUTINE W REFLEX MICROSCOPIC
Glucose, UA: NEGATIVE mg/dL
Protein, ur: NEGATIVE mg/dL
Urobilinogen, UA: 0.2 mg/dL (ref 0.0–1.0)

## 2010-12-21 LAB — CBC
Hemoglobin: 12.6 g/dL (ref 12.0–15.0)
MCHC: 33.9 g/dL (ref 30.0–36.0)
Platelets: 210 10*3/uL (ref 150–400)
RBC: 3.81 MIL/uL — ABNORMAL LOW (ref 3.87–5.11)
RDW: 13.2 % (ref 11.5–15.5)

## 2010-12-21 LAB — LIPASE, BLOOD: Lipase: 25 U/L (ref 11–59)

## 2010-12-21 LAB — DIFFERENTIAL
Basophils Relative: 0 % (ref 0–1)
Eosinophils Absolute: 0 10*3/uL (ref 0.0–0.7)
Lymphs Abs: 0.2 10*3/uL — ABNORMAL LOW (ref 0.7–4.0)
Monocytes Absolute: 0.2 10*3/uL (ref 0.1–1.0)
Monocytes Relative: 2 % — ABNORMAL LOW (ref 3–12)
Neutrophils Relative %: 94 % — ABNORMAL HIGH (ref 43–77)

## 2010-12-21 LAB — LIPID PANEL
LDL Cholesterol: 51 mg/dL (ref 0–99)
Triglycerides: 50 mg/dL (ref <150)

## 2010-12-21 LAB — PROTIME-INR
INR: 1 (ref 0.00–1.49)
Prothrombin Time: 13.1 seconds (ref 11.6–15.2)

## 2010-12-21 LAB — LACTIC ACID, PLASMA: Lactic Acid, Venous: 1.5 mmol/L (ref 0.5–2.2)

## 2011-01-08 LAB — POCT I-STAT, CHEM 8
BUN: 15 mg/dL (ref 6–23)
Calcium, Ion: 1.26 mmol/L (ref 1.12–1.32)
Chloride: 105 mEq/L (ref 96–112)
Potassium: 4.4 mEq/L (ref 3.5–5.1)

## 2011-01-10 ENCOUNTER — Ambulatory Visit (INDEPENDENT_AMBULATORY_CARE_PROVIDER_SITE_OTHER): Payer: Medicare Other | Admitting: *Deleted

## 2011-01-10 DIAGNOSIS — I2699 Other pulmonary embolism without acute cor pulmonale: Secondary | ICD-10-CM

## 2011-01-10 DIAGNOSIS — Z7901 Long term (current) use of anticoagulants: Secondary | ICD-10-CM

## 2011-01-10 LAB — POCT INR: INR: 2.7

## 2011-01-10 NOTE — Progress Notes (Signed)
No return date. 

## 2011-01-10 NOTE — Patient Instructions (Signed)
INR 2.7  Coumadin 5mg  tabs Take 1 tab each day Return in 4 weeks WED May 9 at 2pm

## 2011-02-07 ENCOUNTER — Ambulatory Visit (INDEPENDENT_AMBULATORY_CARE_PROVIDER_SITE_OTHER): Payer: Medicare Other | Admitting: *Deleted

## 2011-02-07 DIAGNOSIS — I2699 Other pulmonary embolism without acute cor pulmonale: Secondary | ICD-10-CM

## 2011-02-07 DIAGNOSIS — Z7901 Long term (current) use of anticoagulants: Secondary | ICD-10-CM

## 2011-02-07 LAB — POCT INR: INR: 2.8

## 2011-02-13 NOTE — Letter (Signed)
April 05, 2008    Erin Roberson, M.D.  [address] [place], [place] [address]   RE:  Erin Roberson  MRN:  [id number]  /  DOB:  03-07-1924   Dear Erin Roberson,   I had the pleasure of seeing your nice patient, Erin Roberson in the  office today in followup.  I was delighted to hear that her thyroid  turned out to be negative for cancer.  In general, she gets along  reasonably well.  She says that her legs bother her in the morning.  After that, however, and when she is up around they seem to be okay.  She has an arterial brachial indices of 1.1 bilaterally.  She also gets  a little lightheaded when she first gets up in the morning, but that is  about the only time.  She has very rare chest discomfort.  As you know,  she underwent a radionuclide imaging study.  On this study, the patient  had some throat tightness with exercise and nondiagnostic baseline  changes.  Her myocardial perfusion study demonstrated a small defect at  the apex, although it was thought to be an overall low-risk study.   CURRENT MEDICATIONS:  1. Lipitor 40 mg daily.  2. Zetia 10 mg daily.  3. Os-Cal.  4. Baby aspirin 81 mg daily.  5. Plavix 75 mg daily.  6. Xalatan eye drops  7. Probiotic.   PHYSICAL EXAMINATION:  GENERAL:  She is alert and oriented.  VITALS:  Her weight is 155 pounds.  The blood pressure 128/70.  The  pulse is 78 and regular.  LUNG:  Fields are clear to auscultation and percussion.  CARDIAC:  Rhythm is regular.   The electrocardiogram demonstrates normal sinus rhythm.  There is a  small anterior delay in R-wave progression with some mild T-wave  inversion.   Erin Roberson's symptoms have not been progressive.  We are going to get a CPK to  make sure that the leg symptoms are not related in any way to  inflammatory changes associated with her lipid-lowering agent.  With  regard to her cardiac situation, she has had a previous LAD defect that  was successfully dilated and had no restenosis at  followup.  I cannot  exclude the possibility that she has recurrent LAD disease and/or  possibly even more extensive disease, although she is not all that  symptomatic.  She does have about an 8-point drop in orthostatic blood  pressure with standing, and this may account for her early morning  symptoms.  I have cautioned her to be careful in getting up.  With  regard to the chest discomfort, if she has any increase in symptoms, she  is to give [place] a prompt call.  Given her age, and the fairly small  defect, we have elected to defer any type of re-evaluation at this point  in time.  However, I would be happy to see her back in followup at any  time in the future and we will plan a followup in 3 months.   I appreciate the opportunity in sharing her care.    Sincerely,       Arturo Morton. Riley Kill, MD, [facility]  Electronically Signed     Arturo Morton. Riley Kill, MD, [facility]  Electronically Signed   TDS/MedQ  DD: 04/05/2008  DT: 04/06/2008  Job #: [id number]

## 2011-02-13 NOTE — Letter (Signed)
August 26, 2007    Mina Marble, M.D.  [address] [place], [place] [address]   RE:  KAYLOR, MAIERS  MRN:  [id number]  /  DOB:  [birth year]   Dear Greggory Stallion:   I had the pleasure of seeing Garrett Bowring in the office today in  followup.  As you know, she had peripheral vascular studies which  demonstrated normal ABIs.  She has also had occasional discomfort up  into the upper left quadrant.  We did do a stress Cardiolite on her and  this demonstrated a little bit of burning discomfort in the throat at  peak exercise.  There were no definite ST segment changes.  There was a  small perfusion defect at the apex with minimal redistribution.  Whether  this represented significant ischemia was difficult to tell but the  ejection fraction was normal and the area was certainly not very large.  It could even represent breast attenuation.   CURRENT MEDICATIONS:  1. Lipitor 40 mg daily.  2. Zetia 10 mg daily.  3. Os-Cal.  4. Baby aspirin 81 mg daily.  5. Plavix 75 mg daily.  6. Premarin.  7. Multivitamin.   PHYSICAL EXAMINATION:  VITAL SIGNS:  The blood pressure is 116/70.  The  pulse is 72.  LUNGS:  The lung fields are clear.  CARDIAC:  Rhythm is regular.   We carefully reviewed all of her perfusion images today together and  talked about a strategy.  I pointed out to her that given her age, well  preserved left ventricular function, and small defect at maximal  exercise which is not usually present during her normal amount of  exercise could suggest that medical therapy should be considered at  present.  We have multiple options, but I think that this might be the  best approach.   We plan to see her back in followup in about 6 weeks to 2 months but  would be happy to see her earlier if there would be a change in her  status, otherwise she will continue on the current medical regimen.   Thanks for allowing me to share in her care.    Sincerely,      Arturo Morton.  Riley Kill, MD, [facility]  Electronically Signed    TDS/MedQ  DD: 08/26/2007  DT: 08/26/2007  Job #: [id number]

## 2011-02-13 NOTE — Assessment & Plan Note (Signed)
[facility]                            CARDIOLOGY OFFICE NOTE   NAME:Pardue, JIMEKA BALAN                        MRN:          [id number]  DATE:01/28/2008                            DOB:          1924-01-04    Consepcion is in for follow-up.  In general, she has been stable.  She has not  been having any ongoing progressive chest pain, although she has a  little bit of discomfort from time to time.  This has been relatively  stable.  She was worried about peripheral arterial disease but tried to  reassure her in this regard.  She underwent a thyroid biopsy.  Formal  findings were not quite clear, but we did pull of the pathology report  today that apparently did not suggest carcinoma.  Specifically, the  report says cytologic features of papillary thyroid carcinoma are not  identified.  She is scheduled to see Dr. Petra Kuba sometime next week.  We talked about cardiac catheterization in some detail.  The patient is  48, and her symptoms are not all that bad.   On physical today, the blood pressure is 120/70, pulse is 76.  The lung fields are clear.  The cardiac rhythm is regular.   This very nice lady has hyperlipidemia on lipid-lowering therapy.  We  have been fairly aggressive.  She also is on low-dose aspirin as well as  Plavix.  With regard to her symptoms, they may or may not be cardiac.  We talked about a cardiac catheterization in some detail; I told her I  am willing to perform this.  She says that she would probably like to  hold off for right now, but we will see her back in follow-up in a month  her to reassess her situation.     Arturo Morton. Riley Kill, MD, [facility]  Electronically Signed    TDS/MedQ  DD: 01/28/2008  DT: 01/28/2008  Job #: [id number]

## 2011-02-13 NOTE — Assessment & Plan Note (Signed)
[facility]                            CARDIOLOGY OFFICE NOTE   NAME:Kaufmann, LEXEE BRASHEARS                        MRN:          [id number]  DATE:01/07/2008                            DOB:          1924-05-10    I had Hildreth come in today to go back over everything.  We had planned to  possibly consider doing a cardiac catheterization on her.  In the  interim, she apparently has had thyroid studies done by Dr. Petra Kuba.  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  In addition, this was consistent with a cold  nodule and a biopsy was recommended.  She seems relatively unaware of  this at the present time and I have strongly encouraged her to make  contact with Dr. Consuello Bossier office in the morning.  Since I last saw  her, she has not been having any increased symptoms, and I think at the  present time we have continued to talk about the possibility of doing a  cardiac catheterization, but I told her today that I thought we should  make contact with Dr. Petra Kuba in the morning and find out what is  planned with regard to her thyroid.   MEDICATIONS:  1. Lipitor 40 mg daily.  2. Zetia 10 mg daily.  3. Baby aspirin 81 mg daily.  4. Plavix 75 mg daily.  5. Eye drops, 1 daily.  6. Multivitamin daily.  7. Probiotic daily.   Also of note, the patient had arterial Dopplers revealing ABIs of 1.1.   PHYSICAL EXAMINATION:  She is alert and oriented, in no acute distress.  The blood pressure is 120/70, pulse of 76.  The lung fields are clear.  CARDIAC:  Exam is unchanged.   RECENT LABORATORY STUDIES:  Reveal a BUN of a 15, creatinine of 1.02 and  a potassium of 4.0.  Hemoglobin 13.4, hematocrit 39.1; her platelet  count is stable at 237,000.   My inclination would be to consider going ahead with cardiac  catheterization with Mrs. Lich; I do, however, first want to discuss  with Dr. Petra Kuba the  findings on her thyroid and what is currently  planned.  I will try to make contact with him tomorrow.     Arturo Morton. Riley Kill, MD, [facility]  Electronically Signed    TDS/MedQ  DD: 01/07/2008  DT: 01/08/2008  Job #: [id number]

## 2011-02-13 NOTE — Assessment & Plan Note (Signed)
[facility]                         GASTROENTEROLOGY OFFICE NOTE   NAME:Erin Roberson, Erin Roberson                        MRN:          [id number]  DATE:06/12/2007                            DOB:          1923/11/03    CHIEF COMPLAINT:  Positive hemoccult.   Ms. Dick is a lady previously followed by Dr. Corinda Gubler.   Her problem list is as follows:  1. Hemoccult positive stool this spring, subsequent hemoccult testing      negative.  2. Colonoscopy, June 21, 2005, showing diverticulosis moderately      severe.  3. Recurrent left lower quadrant pain, etiology not entirely clear,      suspected to be diverticulosis.  4. Iron deficiency without anemia, May 2005.  5. On Plavix for vascular disease, question for peripheral vascular      disease as she has some symptoms of claudication.  6. Prior cervical spine surgery.  7. History of depression.  8. Prior hysterectomy.  9. History of anxiety.  10.Coronary artery disease with a history of percutaneous transluminal      coronary angioplasty of the LAD in 1992.  11.Chronic mild leukopenia (note this is not uncommon in blacks).   MEDICATIONS:  1. Mirtazapine 15 mg at bedtime.  2. Lipitor 40 mg daily.  3. Zetia 10 mg daily.  4. Os-Cal daily.  5. Baby aspirin daily.  6. Plavix 75 mg daily.   There are no known drug allergies.   HISTORY:  She has some rectal pain, she says kind of high up.  Sitting  in the tub helped.  There is no bleeding.  She is not having any left  lower quadrant pain.  She had the workup in May.  Dr. Corinda Gubler suggested  iron.  I am not sure if she is on it.  She had some subsequent repeat  hemoccult testing and one of six cards was positive.  [facility]   PHYSICAL EXAMINATION:  VITAL SIGNS:  Weight 152 pounds, pulse 92, blood  pressure 102/62.  ABDOMEN:  Soft, nontender except in the left inguinal area around the  femoral area.  Note, I cannot detect a  hernia.  Dr. Corinda Gubler thought she  might have an early femoral hernia and he referred her to Dr. Lurene Shadow,  but it is not clear to me she ever saw him for this.  We have no  correspondence to that effect in the chart.   ASSESSMENT:  1. Hemoccult positive stool, recurrent.  2. Iron-deficiency anemia, question occult blood loss.  3. Some rectal pain with a history of left lower quadrant pain.  4. Plavix therapy.   PLAN:  1. I think she should have a colonoscopy given the overall situation      with iron deficiency and hemoccult positive stools.  Probably will      be negative but we should know.  2. Further plans pending that.  An upper endoscopy could be needed.  3. Recheck CBC.  4. We will hold her Plavix and request approval from Dr. Petra Kuba.  5. Note, she also  has described 2 to 3-second episodes of fleeting      chest pain described as a pin like feeling.  I told her I did not      think that was cardiac.  She is considering seeing Dr. Riley Kill      which is reasonable.     Iva Boop, MD,FACG  Electronically Signed    CEG/MedQ  DD: 06/12/2007  DT: 06/13/2007  Job #: [id number]   cc:   Mina Marble, M.D.  Arturo Morton. Riley Kill, MD, [facility]

## 2011-02-13 NOTE — Letter (Signed)
July 06, 2008    Mina Marble, MD  [address] [place], [place] [address]   RE:  Erin Roberson, Erin Roberson  MRN:  [id number]  /  DOB:  09/09/1924   Dear Greggory Stallion,   I had pleasure the seeing your very nice patient, Erin Roberson, in the  office today in followup.  She seems to be getting along really quite  well.  She has rare chest discomfort.  Does not seem to be predictable  or exertional.  Her major complaint is that her ankles get a little  tight, and she gets a little lightheaded when she gets up too quickly in  the morning.  This tends to persist during the day.  However, she denies  any ongoing chest pain.   Today on examination, her blood pressure is 121/82, the pulse is 74.  On  standing, there is about a 5-6 mm drop.  The lung fields are clear, and  the cardiac rhythm was regular.   The electrocardiogram was unchanged from previous tracings demonstrating  normal sinus rhythm and minor nonspecific T-wave abnormality.   She had a recent lipid profile in July 2009.  Total cholesterol was 129  with an LDL of 52 and an HDL of 61.  She had a normal creatinine kinase.   To summarize, this very nice lady is doing well from a cardiac  standpoint.  She is 77 years of age, and my inclination would not be to  expose her to cardiac catheterization as she has a fairly  low risk radionuclide imaging study.  I would still continue to treat  her conservatively, and this includes combined antilipid lowering  therapy, she also remains on a baby aspirin as well as Plavix.  We will  see her back in followup in approximately 6 months' time, I would be  more than happy to see her earlier if further problems arise.    Sincerely,      Arturo Morton. Riley Kill, MD, [facility]  Electronically Signed    TDS/MedQ  DD: 07/06/2008  DT: 07/07/2008  Job #: [id number]

## 2011-02-13 NOTE — Assessment & Plan Note (Signed)
[facility]                            CARDIOLOGY OFFICE NOTE   NAME:Fishman, JESSILYN CATINO                        MRN:          [id number]  DATE:08/12/2007                            DOB:          07/23/1924    [name] is in for a followup visit.  I have not seen her in nearly 10  years.  She underwent angioplasty of the left anterior descending in  1992 with a followup in 1995 that was widely patent.  Our last visit  here was in 2000.  In general, she is getting along well.  She sees Dr.  Corine Shelter for her general medical care.  Her major complaint has  been some tightness in the legs.  She has had some stinging in the chest  and occasional tightness in the chest.  The tightness in the chest is  intermittent.  It is not necessarily predictable.  She works in the yard  a lot.  She goes to exercise class once or twice a week.  The patient  has had diverticulosis and has had an anemia.  She underwent upper and  lower endoscopy, the findings of which apparently showed diverticulosis.  Her cholesterols have been checked regularly.  She is scheduled to have  arterial Dopplers tomorrow at [facility].   Her past medical history is remarkable for Hemoccult positive stool  during the spring, for which she has had followup now.  She had iron  deficiency without anemia in May of 2005.  She has been placed on Plavix  for vascular disease.  She has had prior cervical spine surgery, history  of depression, prior hysterectomy.  She had an LAD intervention in 1992.   CURRENT MEDICATIONS:  1. Lipitor 40 mg daily.  2. Zetia 10 mg daily.  3. Os-Cal daily.  4. Baby aspirin 81 mg daily.  5. Plavix 75 mg daily.  6. Premarin.  7. Multivitamin.   There are no known drug allergies.   Her mother died at 63 and her father died at 67.  Her mother had  diabetes and her father had pancreatic cancer.  Two brothers died, one  with emphysema and the other with  salmonella sepsis.  She has 1 daughter  who graduated from [employer].  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  She has never been a  smoker.  She is divorced.   REVIEW OF SYSTEMS:  She has had some problems with urination.  She was  placed on a medicine, but she did not take it because of possible  interaction with glaucoma.  The review of systems is otherwise negative.   PHYSICAL:  The weight is 155, blood pressure 110/60, pulse 86.  She is  afebrile.  LUNGS:  Clear to auscultation and percussion.  There is no jugular venous distension.  There are no carotid bruits  noted.  HEENT:  Examination is grossly unremarkable.  PMI is not displaced.  There is normal first and second heart sound  without murmur, rub, or gallop.  ABDOMEN:  Soft.  She does have palpable pulses in the feet, although they are diminished.   Electrocardiogram demonstrates normal sinus rhythm with nonspecific T  wave flattening.   IMPRESSION:  1. Coronary artery disease with prior percutaneous coronary artery      intervention.  2. Mild chest discomfort, not exertion-related.  3. History of recent colonoscopy, upper endoscopy, and heme-positive      stools with now nearly-normal hemoglobin of 13.6 in September of      this year.  4. Hypercholesterolemia on lipid-lowering therapy.  5. Urinary hesitancy.  6. History of prior surgical spine surgery.  7. History of prior hysterectomy.  8. History of mild leukopenia with a white count of 3900 in September      2008.   PLAN:  1. Stress Cardiolite will be ordered to rule out significant ischemia.      It has been 17 years since her percutaneous intervention.  Her LDL      is well controlled presumably.  However, she does have some      symptoms and wants to continue exercising.  Therefore, stress      testing would be appropriate.  2. I will have her return to clinic in 3 weeks to review the findings.   ADDENDUM:   Arterial Dopplers to be done and we will review this.  They  have been ordered by Dr. Petra Kuba.     Arturo Morton. Riley Kill, MD, [facility]  Electronically Signed    TDS/MedQ  DD: 08/12/2007  DT: 08/13/2007  Job #: [id number]   cc:   Mina Marble, M.D.

## 2011-02-13 NOTE — Letter (Signed)
October 27, 2007    Mina Marble, M.D.  [address] [place], [place] [address]   RE:  JODELLE, FAUSTO  MRN:  [id number]  /  DOB:  [birth year]   Dear Greggory Stallion:   I had the pleasure of seeing Erin Roberson in the office today in  followup.  Erin Roberson notices a little bit of tightness in the legs in the  morning when she gets up.  She also feels a little bit of tightness in  the chest.  When she gets up and moves around, it seems to all be gone.  She has not had any chest pain with rest standing, and has not had  exertional chest discomfort specifically.   CURRENT MEDICATIONS:  1. Lipitor 40 mg daily.  2. Zetia 10 mg daily.  3. Os-Cal daily.  4. Baby aspirin 81 mg daily.  5. Plavix 75 mg daily.  6. Eye drops daily.   As you know, the patient underwent arterial Doppler studies which in  essence were normal.  She also underwent myocardial perfusion study  imaging demonstrating a small defect at the apex.   PHYSICAL EXAMINATION:  VITAL SIGNS:  Today on examination, the blood  pressure is 138/78, pulse is 67.  LUNGS:  The lung fields are clear.  CARDIAC:  Rhythm is regular.  EXTREMITIES:  Her extremities do not demonstrate significant edema.  The  peripheral pulses are intact.  There is no obvious tenderness to suggest  DVT.   Overall, this nice lady is stable.  She has had some symptoms, but given  her age and relative stability, we have not recommended any type of  catheterization procedure.  We will continue to  follow up in 2 months to stability.  Her EKG today reveals sinus rhythm  and minor nonspecific T-wave abnormalities, but no acute changes.   I appreciate the opportunity of sharing this nice lady's care.    Sincerely,      Arturo Morton. Riley Kill, MD, [facility]  Electronically Signed    TDS/MedQ  DD: 10/27/2007  DT: 10/28/2007  Job #: [id number]

## 2011-02-13 NOTE — Assessment & Plan Note (Signed)
[facility]                            CARDIOLOGY OFFICE NOTE   NAME:Jaworowski, NASIM GAROFANO                        MRN:          [id number]  DATE:01/02/2008                            DOB:          Jul 30, 1924    Erin Roberson is in for followup.  Holle has been stable.  She does notice that  when she wakes up in the morning, her legs feel tight.  She also has a  tightness all across the left side of the chest.  This has been a  relatively stable pattern, but it has been persistent and concerns her.  She also inquired about whether she could have PAD, because she sees a  lot about this on television.  The patient did undergo myocardial  perfusion scan in 2008.  At that time, she had some dyspnea and throat  tightness and about a millimeter of upsloping ST depression inferiorly  and in V5 and V6.  It recovered within 2 minutes.  There was an apical  abnormality that was slightly more prominent.  She has not had  exertional symptoms and in fact when she gets up in the morning, and  starts to move around many of the symptoms resolve.   EXAMINATION:  VITAL SIGNS:  Today on examination, the blood pressure is  110/68, the pulse is 88.  LUNGS:  The lung fields are clear.  The cardiac rhythm is regular.  It  does feel that she has intact pulses in the feet, although they are  about 1+.   Her electrocardiogram demonstrates normal sinus rhythm.  There is some T-  wave inversion noted in V3, all the way through V6, although it is  mostly T flattening.   I have discussed the case with [facility] in some detail.  The patient has had  prior percutaneous intervention 17 years ago.  She has some subtle EKG  changes.  The symptoms have not been necessarily progressive, and she  does not have a high risk myocardial perfusion scan, but she does have  an abnormality.  We talked about various approaches.  We are going to go  ahead and get some arterial Doppler studies of the lower extremities  for  next week.  She will see me back in followup on the same day, and I have  talked about the possibility of proceeding on with cardiac  catheterization within the next week.  She is agreeable to this  approach.  If she has any problems in the interim, she is to call me.     Arturo Morton. Riley Kill, MD, [facility]  Electronically Signed    TDS/MedQ  DD: 01/02/2008  DT: 01/02/2008  Job #: [id number]

## 2011-02-16 ENCOUNTER — Encounter: Payer: Self-pay | Admitting: Cardiology

## 2011-02-16 ENCOUNTER — Other Ambulatory Visit (HOSPITAL_COMMUNITY): Payer: Self-pay | Admitting: Pulmonary Disease

## 2011-02-16 DIAGNOSIS — R339 Retention of urine, unspecified: Secondary | ICD-10-CM

## 2011-02-16 NOTE — Assessment & Plan Note (Signed)
[place]                         GASTROENTEROLOGY OFFICE NOTE   NAME:Grant, TALLEY CASCO                        MRN:          [id number]  DATE:02/03/2007                            DOB:          04-Nov-1923    Erin Roberson comes in on May 5, it says here because of positive hemoccult and  some lower left-sided abdominal pain.  Otherwise, she is doing well.  Has minimal upper GI symptoms, occasional GERD, but nothing significant.  Taking MiraLax.  She sees occasional bright red blood per rectum, but  nothing of significance.  She is on Plavix.  She was referred by Dr.  Corine Shelter.  Her other medical problems include  hypercholesterolemia, arteriosclerotic cardiovascular disease, and  diverticular disease __________.   PHYSICAL EXAMINATION:  She weighs 154.  She is slightly pale.  Blood  pressure 123/82, pulse 68 and regular.  Neck, __________, and extremities are all basically unchanged and  remarkable.   IMPRESSION:  As above.   RECOMMENDATIONS:  To get CBC, serum iron, iron binding, ferritin, B12  and folate.  Get a CMET and hemoccult's on her after she takes Zegerid  for about a week for her reflux symptoms.  If there is evidence of  significant iron deficiency anemia and positive stools, then I think  that a followup colonoscopic examination would be in order.  I would be  very surprised if nothing was seen of significant diverticular changes  from last time, which was only several years previous.  We will wait for  the above studies to make more definitive decision.     Ulyess Mort, MD  Electronically Signed    SML/MedQ  DD: 02/03/2007  DT: 02/04/2007  Job #: [id number]   cc:   Mina Marble, M.D.

## 2011-02-16 NOTE — Assessment & Plan Note (Signed)
[facility]                         GASTROENTEROLOGY OFFICE NOTE   NAME:[name], [name]                        MRN:          [id number]  DATE:10/18/2006                            DOB:          July 24, 1924    Erin Roberson comes in on January 18. Has been having some left-sided abdominal  pain. She thinks it is her diverticulum. It is intermittent. It has been  going on for two or three months. It is getting worse. Says when she  turns to the left side it causes her more difficulty. She has no fever  or chills. Bowel activity has been perfectly normal. Her last  colonoscopic examination was in 2006. It revealed moderately severe  diverticular disease.   PHYSICAL EXAMINATION:  VITAL SIGNS:  __________, blood pressure 118/62,  pulse 84 and regular.  HEENT:  Oropharynx negative.  NECK:  Negative.  CHEST:  Clear.  HEART:  Revealed regular rhythm without significant murmur.  ABDOMEN:  Soft with no masses or organomegaly, nontender, although there  was some tenderness when palpated which I thought was more localized  around the femoral ring, but I could not be sure. Could not feel a  herniation.  RECTAL:  Deferred.   IMPRESSION:  1. Recurrent left lower quadrant discomfort. I think probably early      femoral hernia. I doubt diverticulosis, but I could not be sure.  2. Hypercholesterolemia.  3. Arteriosclerotic coronary vascular disease.   RECOMMENDATIONS:  Get CT of her abdomen and pelvis. I am going to refer  her to see Dr. Dominga Ferry who has taken care of her in the past. I think  that we need to answer the question of whether this is truly  diverticular disease or whether it might be an early development of a  femoral hernia. I appreciate Dr. Danella Penton input.     Ulyess Mort, MD  Electronically Signed    SML/MedQ  DD: 10/18/2006  DT: 10/18/2006  Job #: [id number]   cc:   [name], M.D.

## 2011-02-16 NOTE — Op Note (Signed)
NAME:  Erin Roberson, WOODROW                           ACCOUNT NO.:  [id number]   MEDICAL RECORD NO.:  [id number]                   PATIENT TYPE:  OIB   LOCATION:  2855                                 FACILITY:  [facility]   PHYSICIAN:  Guadelupe Sabin, M.D.             DATE OF BIRTH:  04-13-1924   DATE OF PROCEDURE:  01/19/2003  DATE OF DISCHARGE:  01/19/2003                                 OPERATIVE REPORT   PREOPERATIVE DIAGNOSIS:  Senile nuclear cataract, right eye.   POSTOPERATIVE DIAGNOSIS:  Senile nuclear cataract, right eye.   PROCEDURE:  Planned extracapsular cataract extraction - phacoemulsification,  primary insertion of posterior chamber intraocular lens implant.   SURGEON:  Guadelupe Sabin, M.D.   ASSISTANT:  Nurse.   ANESTHESIA:  Local 4% Xylocaine, 0.75% Marcaine with Wydase added  retrobulbar injection, topical Tetracaine, intraocular Xylocaine. Anesthesia  standby required. The patient given sodium Pentothal or Ditropan  intravenously during the period of retrobulbar blocking.   DESCRIPTION OF PROCEDURE:  After the patient was prepped and draped, a lid  speculum was inserted in the right eye. The eye was turned downward and a  superior rectus traction suture placed. Schiotz tonometry was recorded and  felt to be within normal scale readings.  A peritomy was performed at the  limbus from the 11 to 1 o'clock position. The corneoscleral junction was  cleaned and a corneoscleral groove made with a 45 degree Superblade. The  anterior chamber was then entered with the 2.5 mm diamond Keratome at the 12  o'clock position and the 15 degree blade at the 2:30 position.  Using a bent  26 gauge needle on a Healon syringe, a circular capsulorrhexis was begun.  Due to the maturity of the lens, this was somewhat difficult and the type of  capsulotomy changed to a can opener style. The hydrodissection and  hydrodelineation were performed using 1% Xylocaine. The lens nucleus was  then  flipped into the pupillary space vertically and emulsified with the  phacoemulsification unit using a 30 degree tip. Total ultrasonic time; 1  minute 11 seconds. Average power level; 19%.  Total amount of fluid used;  100 mL.  Following removal of the nucleus, the residual cortex was aspirated  with the irrigation/aspiration tip. The posterior capsule appeared intact  with a brilliant red fundus reflex.  It was therefore elected to insert an  [employer] [id number] silicon three-piece posterior chamber  intraocular lens implant, Diopter strength +20.50. This was inserted with a  McDonald forceps into the anterior chamber and then centered into the  capsular bag using the [facility] lens rotator. The lens appeared to be well  centered. The Healon which had been used throughout the procedure was  aspirated and replaced with balanced salt solution and Miochol ophthalmic  solution. The operative incisions appeared to be self-sealing and no sutures  were required.  Maxitrol ointment was instilled in  the conjunctival cul-de-  sac and a light patch and protector shield applied to the operated right  eye.  Duration of the procedure and anesthesia administration; 45 minutes.  The patient tolerated the procedure well in general and left the operating  room for the recovery room in good condition.                                               Guadelupe Sabin, M.D.   HNJ/MEDQ  D:  03/11/2003  T:  03/11/2003  Job:  [id number]

## 2011-02-16 NOTE — Assessment & Plan Note (Signed)
[place] [facility]                         GASTROENTEROLOGY OFFICE NOTE   NAME:Bittman, LODIE WAHEED                        MRN:          [id number]  DATE:09/18/2006                            DOB:          1923-11-29    The patient comes in, says that she is having some left lower quadrant  pain and some diverticulitis, started 2 months ago with some increasing  constipation, took probiotics and this did seem to help.   PHYSICAL EXAMINATION:  She weighed 157, blood pressure 106/60, pulse 64  and regular.  Neck arteries, extremities are all unremarkable except for her abdomen  having some slight tenderness to palpation in the left lower quadrant.   Her last colonoscopic examination was on June 21, 2005, revealed a  moderately severe degree of diverticular disease, and was otherwise  negative.  I saw her on May 14, 2005, for the same type of symptoms,  thought she might have some diverticulitis at that time as well.   PAST MEDICAL HISTORY:  Reveals she has some arrhythmias, hyperlipidemia,  anxiety-depression problems, and she is status post hysterectomy.   RECOMMENDATIONS:  My recommendation today was to put her on some Cipro  for suspected possible mild diverticulitis.  If her symptoms are not any  better after her course of Cipro, then I think a CT of her abdomen and  pelvis should be carried out.  In the meantime I think she should get  CBC and a CMET as well.     Ulyess Mort, MD  Electronically Signed    SML/MedQ  DD: 09/18/2006  DT: 09/19/2006  Job #: [id number]

## 2011-02-16 NOTE — Letter (Signed)
October 18, 2006    [name], M.D.  [address]  [address]  [place], [place] [address]   RE:  [name], [name]  MRN:  [id number]  /  DOB:  1924/08/31   Dear Dennie Bible:   I am referring this very nice patient, Erin Roberson, to you. I am not  sure whether we are dealing with a femoral hernia or whether this is  recurrent diverticular disease, although I think the former is more  likely from the location. I appreciate your consultation concerning this  situation. She says her symptoms are increasing somewhat, and that is  what has made it somewhat concerning. I am getting a CT scan prior to  her visit with you, and hopefully this will be helpful. Again thanks for  your consultation.    Sincerely,      Ulyess Mort, MD  Electronically Signed    SML/MedQ  DD: 10/18/2006  DT: 10/18/2006  Job #: [id number]

## 2011-02-21 ENCOUNTER — Encounter: Payer: Self-pay | Admitting: Cardiology

## 2011-02-21 ENCOUNTER — Ambulatory Visit (INDEPENDENT_AMBULATORY_CARE_PROVIDER_SITE_OTHER): Payer: Medicare Other | Admitting: Cardiology

## 2011-02-21 DIAGNOSIS — E7800 Pure hypercholesterolemia, unspecified: Secondary | ICD-10-CM

## 2011-02-21 DIAGNOSIS — R0789 Other chest pain: Secondary | ICD-10-CM

## 2011-02-21 DIAGNOSIS — I2699 Other pulmonary embolism without acute cor pulmonale: Secondary | ICD-10-CM

## 2011-02-21 DIAGNOSIS — I2510 Atherosclerotic heart disease of native coronary artery without angina pectoris: Secondary | ICD-10-CM

## 2011-02-21 LAB — BASIC METABOLIC PANEL
BUN: 16 mg/dL (ref 6–23)
Chloride: 104 mEq/L (ref 96–112)
Potassium: 4.3 mEq/L (ref 3.5–5.1)
Sodium: 141 mEq/L (ref 135–145)

## 2011-02-21 NOTE — Assessment & Plan Note (Signed)
LDL well controlled on medication.

## 2011-02-21 NOTE — Progress Notes (Signed)
HPI:  Erin Roberson is having occasional chest pain.  It is on the left side.  It is not associated with much shortness of breath.  Denies other symptoms.  Has known PE.  No hemoptysis.    Current Outpatient Prescriptions  Medication Sig Dispense Refill  . atorvastatin (LIPITOR) 40 MG tablet Take by mouth daily.        . bisacodyl (DULCOLAX) 5 MG EC tablet Take 5 mg by mouth daily as needed.        . calcium-vitamin D (OSCAL WITH D) 500-200 MG-UNIT per tablet Take 1 tablet by mouth daily.        . Multiple Vitamins-Minerals (CENTRUM SILVER PO) Take by mouth daily.        . NON FORMULARY Soln.one drop each eye at bed time (LATANOPROST  0.005%)       . PROBIOTIC CAPS Take by mouth daily.        . psyllium (METAMUCIL) 58.6 % powder Take by mouth. 2 Tablespoon every day.       . warfarin (COUMADIN) 5 MG tablet Take by mouth as directed.        Marland Kitchen DISCONTD: aspirin 81 MG tablet Take 81 mg by mouth daily.        Marland Kitchen DISCONTD: latanoprost (XALATAN) 0.005 % ophthalmic solution Place 1 drop into both eyes at bedtime.        Marland Kitchen DISCONTD: nitroGLYCERIN (NITROSTAT) 0.4 MG SL tablet Place 0.4 mg under the tongue. 1 Tablet under tongue at onset of chest pain; you may repeat every 5 minutes for up to 3 doses.       Marland Kitchen DISCONTD: pregabalin (LYRICA) 75 MG capsule Take by mouth as needed.        Marland Kitchen DISCONTD: zolpidem (AMBIEN CR) 6.25 MG CR tablet Take 6.25 mg by mouth at bedtime as needed.          No Known Allergies  Past Medical History  Diagnosis Date  . Personal history of other disorder of urinary system   . Other pulmonary embolism and infarction   . Palpitations   . Dizziness and giddiness   . Other chest pain   . Abnormality of gait   . Coronary atherosclerosis of unspecified type of vessel, native or graft   . Pure hypercholesterolemia   . Overweight   . Depressive disorder, not elsewhere classified   . Unspecified cardiovascular disease   . Iron deficiency anemia, unspecified   . Hemorrhage of rectum and  anus   . Abdominal pain, left lower quadrant   . Leukocytopenia, unspecified   . Urinary hesitancy   . Diverticulosis of colon (without mention of hemorrhage)   . Dizziness     orthostatic  . Chest pain     Past Surgical History  Procedure Date  . Cervical spine surgery   . Cataract surgery   . Cardiac catheterization 07/25/10    Family History  Problem Relation Age of Onset  . Diabetes Mother     deceased age 31  . Pancreatic cancer Father     deceased age 18  . Emphysema Brother     deceased    History   Social History  . Marital Status: Divorced    Spouse Name: N/A    Number of Children: N/A  . Years of Education: N/A   Occupational History  .     Social History Main Topics  . Smoking status: Never Smoker   . Smokeless tobacco: Not on file  . Alcohol  Use: Not on file  . Drug Use: Not on file  . Sexually Active: Not on file   Other Topics Concern  . Not on file   Social History Narrative  . No narrative on file    ROS: Please see the HPI.  All other systems reviewed and negative.  PHYSICAL EXAM:  BP 114/72  Ht 5\' 4"  (1.626 m)  Wt 154 lb (69.854 kg)  BMI 26.43 kg/m2  General: Well developed, well nourished, in no acute distress. Head:  Normocephalic and atraumatic. Neck: no JVD Lungs: Clear to auscultation and percussion. Heart: Normal S1 and S2.  No murmur, rubs or gallops.  Abdomen:  Normal bowel sounds; soft; non tender; no organomegaly Pulses: Pulses normal in all 4 extremities. Extremities: No clubbing or cyanosis. No edema. Neurologic: Alert and oriented x 3.  EKG:  NSR.  Non specific T wave abnormality.  ASSESSMENT AND PLAN:

## 2011-02-21 NOTE — Patient Instructions (Signed)
Your physician recommends that you have lab work today: BMP  Non-Cardiac CT Angiography (CTA), is a special type of CT scan that uses a computer to produce multi-dimensional views of major blood vessels throughout the body. In CT angiography, a contrast material is injected through an IV to help visualize the blood vessels.  CTA of Chest to rule out PE  Your physician recommends that you schedule a follow-up appointment in: 4 MONTHS  Your physician recommends that you continue on your current medications as directed. Please refer to the Current Medication list given to you today.

## 2011-02-21 NOTE — Assessment & Plan Note (Signed)
Non obstructive disease at cath.  See report in [place].

## 2011-02-21 NOTE — Assessment & Plan Note (Signed)
See results in Echart of CT scan.  Would favor repeating CT scan at this point with very mild symptoms.  Pt agreeable.

## 2011-02-22 ENCOUNTER — Ambulatory Visit (INDEPENDENT_AMBULATORY_CARE_PROVIDER_SITE_OTHER)
Admission: RE | Admit: 2011-02-22 | Discharge: 2011-02-22 | Disposition: A | Discharge status: Home / Self Care | Payer: Medicare Other | Source: Ambulatory Visit | Attending: Cardiology | Admitting: Cardiology

## 2011-02-22 DIAGNOSIS — R0789 Other chest pain: Secondary | ICD-10-CM

## 2011-02-22 DIAGNOSIS — I2699 Other pulmonary embolism without acute cor pulmonale: Secondary | ICD-10-CM

## 2011-02-22 MED ORDER — IOHEXOL 300 MG/ML  SOLN
100.0000 mL | Freq: Once | INTRAMUSCULAR | Status: AC | PRN
  Administered 2011-02-22: 100 mL via INTRAVENOUS

## 2011-02-23 ENCOUNTER — Ambulatory Visit (HOSPITAL_COMMUNITY)
Admission: RE | Admit: 2011-02-23 | Discharge: 2011-02-23 | Disposition: A | Discharge status: Home / Self Care | Payer: Medicare Other | Source: Ambulatory Visit | Attending: Pulmonary Disease | Admitting: Pulmonary Disease

## 2011-02-23 DIAGNOSIS — R339 Retention of urine, unspecified: Secondary | ICD-10-CM | POA: Insufficient documentation

## 2011-03-07 ENCOUNTER — Ambulatory Visit (INDEPENDENT_AMBULATORY_CARE_PROVIDER_SITE_OTHER): Payer: Medicare Other | Admitting: *Deleted

## 2011-03-07 DIAGNOSIS — Z7901 Long term (current) use of anticoagulants: Secondary | ICD-10-CM

## 2011-03-07 DIAGNOSIS — I2699 Other pulmonary embolism without acute cor pulmonale: Secondary | ICD-10-CM

## 2011-03-07 LAB — POCT INR: INR: 2.7

## 2011-04-16 ENCOUNTER — Ambulatory Visit (INDEPENDENT_AMBULATORY_CARE_PROVIDER_SITE_OTHER): Payer: Medicare Other | Admitting: *Deleted

## 2011-04-16 DIAGNOSIS — Z7901 Long term (current) use of anticoagulants: Secondary | ICD-10-CM

## 2011-04-16 DIAGNOSIS — I2699 Other pulmonary embolism without acute cor pulmonale: Secondary | ICD-10-CM

## 2011-04-16 LAB — POCT INR: INR: 1.7

## 2011-05-01 ENCOUNTER — Ambulatory Visit (INDEPENDENT_AMBULATORY_CARE_PROVIDER_SITE_OTHER): Payer: Medicare Other | Admitting: *Deleted

## 2011-05-01 DIAGNOSIS — Z7901 Long term (current) use of anticoagulants: Secondary | ICD-10-CM

## 2011-05-01 DIAGNOSIS — I2699 Other pulmonary embolism without acute cor pulmonale: Secondary | ICD-10-CM

## 2011-05-01 LAB — POCT INR: INR: 2.2

## 2011-05-29 ENCOUNTER — Ambulatory Visit (INDEPENDENT_AMBULATORY_CARE_PROVIDER_SITE_OTHER): Payer: Medicare Other | Admitting: *Deleted

## 2011-05-29 DIAGNOSIS — I2699 Other pulmonary embolism without acute cor pulmonale: Secondary | ICD-10-CM

## 2011-05-29 DIAGNOSIS — Z7901 Long term (current) use of anticoagulants: Secondary | ICD-10-CM

## 2011-06-21 ENCOUNTER — Ambulatory Visit (INDEPENDENT_AMBULATORY_CARE_PROVIDER_SITE_OTHER): Payer: Medicare Other | Admitting: Cardiology

## 2011-06-21 ENCOUNTER — Encounter: Payer: Self-pay | Admitting: Cardiology

## 2011-06-21 DIAGNOSIS — I2699 Other pulmonary embolism without acute cor pulmonale: Secondary | ICD-10-CM

## 2011-06-21 DIAGNOSIS — E7800 Pure hypercholesterolemia, unspecified: Secondary | ICD-10-CM

## 2011-06-21 DIAGNOSIS — I2510 Atherosclerotic heart disease of native coronary artery without angina pectoris: Secondary | ICD-10-CM

## 2011-06-21 NOTE — Patient Instructions (Signed)
Your physician has requested that you have a lower extremity venous duplex. This test is an ultrasound of the veins in the legs. It looks at venous blood flow that carries blood from the heart to the legs. Allow one hour for a Lower Venous exam. There are no restrictions or special instructions.  Your physician wants you to follow-up in: 4 MONTHS. You will receive a reminder letter in the mail two months in advance. If you don't receive a letter, please call our office to schedule the follow-up appointment.  Your physician recommends that you continue on your current medications as directed. Please refer to the Current Medication list given to you today.

## 2011-06-21 NOTE — Progress Notes (Signed)
HPI:  Erin Roberson is in for follow up.  She is doing pretty well.  She has occasional sharp chest pain in the left chest area, but it is brief.  The patient had chest pain, had cath with patent arteries, and had evidence of PE.  She has been on warfarin for nearly a year.  She does not fall down, and does not seem unsteady on her feet.  She is at risk because of mobility for recurrent events.  Her follow up CT scan was negative.  We have been on the fence about continuation of warfarin anticoagulation, in part because of this and her symptoms, which are always similar to her original presentation, albeit less severe.  No bleeding episodes.    Current Outpatient Prescriptions  Medication Sig Dispense Refill  . atorvastatin (LIPITOR) 40 MG tablet Take by mouth daily.        . bisacodyl (DULCOLAX) 5 MG EC tablet Take 5 mg by mouth daily as needed.        . calcium-vitamin D (OSCAL WITH D) 500-200 MG-UNIT per tablet Take 1 tablet by mouth daily.        . DONEPEZIL HCL PO Take 1 tablet by mouth daily.        . Levothyroxine Sodium 50 MCG CAPS Take 1 capsule by mouth daily.        . NON FORMULARY Soln.one drop each eye at bed time (LATANOPROST  0.005%)       . NON FORMULARY Vit D  1 tab po qd       . PROBIOTIC CAPS Take by mouth daily.        . psyllium (METAMUCIL) 58.6 % powder Take by mouth. 2 Tablespoon every day.       . warfarin (COUMADIN) 5 MG tablet Take by mouth as directed.          No Known Allergies  Past Medical History  Diagnosis Date  . Personal history of other disorder of urinary system   . Other pulmonary embolism and infarction   . Palpitations   . Dizziness and giddiness   . Other chest pain   . Abnormality of gait   . Coronary atherosclerosis of unspecified type of vessel, native or graft   . Pure hypercholesterolemia   . Overweight   . Depressive disorder, not elsewhere classified   . Unspecified cardiovascular disease   . Iron deficiency anemia, unspecified   . Hemorrhage of  rectum and anus   . Abdominal pain, left lower quadrant   . Leukocytopenia, unspecified   . Urinary hesitancy   . Diverticulosis of colon (without mention of hemorrhage)   . Dizziness     orthostatic  . Chest pain     Past Surgical History  Procedure Date  . Cervical spine surgery   . Cataract surgery   . Cardiac catheterization 07/25/10    Family History  Problem Relation Age of Onset  . Diabetes Mother     deceased age 71  . Pancreatic cancer Father     deceased age 26  . Emphysema Brother     deceased    History   Social History  . Marital Status: Divorced    Spouse Name: N/A    Number of Children: N/A  . Years of Education: N/A   Occupational History  .     Social History Main Topics  . Smoking status: Never Smoker   . Smokeless tobacco: Not on file  . Alcohol Use: Not on  file  . Drug Use: Not on file  . Sexually Active: Not on file   Other Topics Concern  . Not on file   Social History Narrative  . No narrative on file    ROS: Please see the HPI.  All other systems reviewed and negative.  PHYSICAL EXAM:  BP 115/68  Pulse 72  Ht 5\' 3"  (1.6 m)  Wt 152 lb (68.947 kg)  BMI 26.93 kg/m2  General: Well developed, well nourished, in no acute distress. Head:  Normocephalic and atraumatic. Neck: no JVD Lungs: Clear to auscultation and percussion. Heart: Normal S1 and S2.  No murmur, rubs or gallops.  Abdomen:  Normal bowel sounds; soft; non tender; no organomegaly Pulses: Pulses normal in all 4 extremities. Extremities: No clubbing or cyanosis. No edema. Neurologic: Alert and oriented x 3.  EKG:  NSr.  T inversion anterolaterally and inferiorly.    ASSESSMENT AND PLAN:

## 2011-06-24 NOTE — Assessment & Plan Note (Addendum)
Hard to know best option here.  Last scan was negative. See above.  She does not necessarily have a high risk profile, but last event may have been mediated by lack of significant mobility, hence, possible reason for recurrence. Tolerating for now without bleeding episodes of severity  (hematuria).  It is notable that her PE was not insignificant, that is minor, and as noted, without obvious precipitation.  As such, we are leaving her on warfarin for the present time.

## 2011-06-24 NOTE — Assessment & Plan Note (Signed)
No side effects, no recurrence by cath, and near target.  Seems like best strategy for long term prevention of events reduction.

## 2011-06-24 NOTE — Assessment & Plan Note (Signed)
No significant recurrence at the time of cath study.

## 2011-07-02 ENCOUNTER — Encounter: Payer: Medicare Other | Admitting: *Deleted

## 2011-07-02 ENCOUNTER — Ambulatory Visit (INDEPENDENT_AMBULATORY_CARE_PROVIDER_SITE_OTHER): Payer: Medicare Other | Admitting: *Deleted

## 2011-07-02 DIAGNOSIS — I2699 Other pulmonary embolism without acute cor pulmonale: Secondary | ICD-10-CM

## 2011-07-02 DIAGNOSIS — Z7901 Long term (current) use of anticoagulants: Secondary | ICD-10-CM

## 2011-07-02 LAB — POCT INR: INR: 2.3

## 2011-07-20 ENCOUNTER — Encounter (INDEPENDENT_AMBULATORY_CARE_PROVIDER_SITE_OTHER): Payer: Medicare Other | Admitting: *Deleted

## 2011-07-20 DIAGNOSIS — I2510 Atherosclerotic heart disease of native coronary artery without angina pectoris: Secondary | ICD-10-CM

## 2011-07-20 DIAGNOSIS — M79609 Pain in unspecified limb: Secondary | ICD-10-CM

## 2011-07-20 DIAGNOSIS — I2699 Other pulmonary embolism without acute cor pulmonale: Secondary | ICD-10-CM

## 2011-07-30 ENCOUNTER — Ambulatory Visit: Payer: Medicare Other | Admitting: Internal Medicine

## 2011-08-06 ENCOUNTER — Ambulatory Visit (INDEPENDENT_AMBULATORY_CARE_PROVIDER_SITE_OTHER): Payer: Medicare Other | Admitting: *Deleted

## 2011-08-06 DIAGNOSIS — Z7901 Long term (current) use of anticoagulants: Secondary | ICD-10-CM

## 2011-08-06 DIAGNOSIS — I2699 Other pulmonary embolism without acute cor pulmonale: Secondary | ICD-10-CM

## 2011-08-06 LAB — POCT INR: INR: 2

## 2011-08-09 ENCOUNTER — Ambulatory Visit (INDEPENDENT_AMBULATORY_CARE_PROVIDER_SITE_OTHER): Payer: Medicare Other | Admitting: Internal Medicine

## 2011-08-09 ENCOUNTER — Encounter: Payer: Self-pay | Admitting: Internal Medicine

## 2011-08-09 VITALS — BP 118/64 | HR 72 | Ht 64.0 in | Wt 151.0 lb

## 2011-08-09 DIAGNOSIS — K5730 Diverticulosis of large intestine without perforation or abscess without bleeding: Secondary | ICD-10-CM

## 2011-08-09 DIAGNOSIS — R1030 Lower abdominal pain, unspecified: Secondary | ICD-10-CM

## 2011-08-09 DIAGNOSIS — K5909 Other constipation: Secondary | ICD-10-CM

## 2011-08-09 DIAGNOSIS — R109 Unspecified abdominal pain: Secondary | ICD-10-CM

## 2011-08-09 DIAGNOSIS — K5900 Constipation, unspecified: Secondary | ICD-10-CM

## 2011-08-09 MED ORDER — POLYETHYLENE GLYCOL 3350 17 GM/SCOOP PO POWD: 17.0000 g | Freq: Every day | ORAL | Status: AC

## 2011-08-09 MED ORDER — BISACODYL 5 MG PO TBEC: 5.0000 mg | DELAYED_RELEASE_TABLET | Freq: Every day | ORAL | Status: DC | PRN

## 2011-08-09 NOTE — Patient Instructions (Signed)
You have been prescribed Miralax , please take every day. Take Doculax as on medication list. Follow up to see Dr. Leone Payor as needed.

## 2011-08-09 NOTE — Assessment & Plan Note (Signed)
I think this is the problem causing her lower abdominal discomfort. I'm hopeful with a better regimen of MiraLax every day and Dulcolax every 3 days as needed will improve this and relieve her symptoms.

## 2011-08-09 NOTE — Progress Notes (Signed)
[facility] Note  Erin Roberson [id number] Sep 26, 1924   HPI: This pleasant elderly lady returns for a followup visit. She is complaining of discomfort in the lower abdomen going from left across the suprapubic to the right. She does notice that if she has a good bowel movement he feels better and warm or hot water from the bath will help it. There is not been any bleeding. She is using Dulcolax and Metamucil as well as prunes used to promote bowel movements but moves her bowels 2-3 times a week only. There is some associated low back pain at times as well. She has noted that her urine is dark at times but thinks she drinks more fluids that resolves, otherwise there are no urinary problems.   Outpatient Encounter Prescriptions as of 08/09/2011  Medication Sig Dispense Refill  . atorvastatin (LIPITOR) 40 MG tablet Take by mouth daily.        . bisacodyl (DULCOLAX) 5 MG EC tablet Take 5 mg by mouth daily as needed.        . calcium-vitamin D (OSCAL WITH D) 500-200 MG-UNIT per tablet Take 1 tablet by mouth daily.        . DONEPEZIL HCL PO Take 1 tablet by mouth daily.       . NON FORMULARY Soln.one drop each eye at bed time (LATANOPROST  0.005%)       . NON FORMULARY Vit D  1 tab po qd       . PROBIOTIC CAPS Take by mouth daily.        . psyllium (METAMUCIL) 58.6 % powder Take by mouth as needed.       . warfarin (COUMADIN) 5 MG tablet Take by mouth as directed.        . Levothyroxine Sodium 50 MCG CAPS Take 1 capsule by mouth daily.         No Known Allergies Past Medical History  Diagnosis Date  . Other pulmonary embolism and infarction   . Pure hypercholesterolemia   . Overweight   . Depression   . Unspecified cardiovascular disease   . Iron deficiency anemia, unspecified   . Hemorrhage of rectum and anus   . Leukocytopenia, unspecified   . Urinary hesitancy   . Diverticulosis of colon (without mention of hemorrhage)   . Coronary atherosclerosis   . Glaucoma   .  Fibromyalgia    Past Surgical History  Procedure Date  . Cervical spine surgery   . Cataract extraction 2004    right  . Cardiac catheterization 07/25/10  . Abdominal hysterectomy   . Coronary angioplasty 1992  . Colonoscopy 07/01/2007    diverticulosis  . Upper gastrointestinal endoscopy 07/16/2007    tortous esophagus with spasms      PE: Filed Vitals:   08/09/11 1055  BP: 118/64  Pulse: 72       Assessment/Plan:

## 2011-08-09 NOTE — Assessment & Plan Note (Signed)
This is severe and known from prior colonoscopies. Treat the constipation with MiraLax and Dulcolax she can continue her other regimen as well and hopefully her symptoms will be resolved. If not she will return for followup.

## 2011-08-10 ENCOUNTER — Other Ambulatory Visit: Payer: Self-pay | Admitting: Obstetrics & Gynecology

## 2011-08-10 DIAGNOSIS — N644 Mastodynia: Secondary | ICD-10-CM

## 2011-08-10 DIAGNOSIS — N6320 Unspecified lump in the left breast, unspecified quadrant: Secondary | ICD-10-CM

## 2011-09-12 ENCOUNTER — Encounter: Payer: Self-pay | Admitting: Cardiology

## 2011-09-12 ENCOUNTER — Ambulatory Visit (INDEPENDENT_AMBULATORY_CARE_PROVIDER_SITE_OTHER): Payer: Medicare Other | Admitting: Cardiology

## 2011-09-12 VITALS — BP 118/68 | HR 83 | Ht 68.0 in | Wt 150.8 lb

## 2011-09-12 DIAGNOSIS — I2510 Atherosclerotic heart disease of native coronary artery without angina pectoris: Secondary | ICD-10-CM

## 2011-09-12 DIAGNOSIS — E7800 Pure hypercholesterolemia, unspecified: Secondary | ICD-10-CM

## 2011-09-12 DIAGNOSIS — I2699 Other pulmonary embolism without acute cor pulmonale: Secondary | ICD-10-CM

## 2011-09-12 DIAGNOSIS — R0789 Other chest pain: Secondary | ICD-10-CM

## 2011-09-12 NOTE — Patient Instructions (Signed)
Your physician recommends that you schedule a follow-up appointment in: 3 months.  

## 2011-09-15 NOTE — Assessment & Plan Note (Signed)
Probably about due for lipid.  She could have this done with Dr. Petra Kuba.

## 2011-09-15 NOTE — Progress Notes (Signed)
Patient ID: Erin Roberson, female   DOB: [birth year], 77 y.o.   MRN: [id number]

## 2011-09-15 NOTE — Assessment & Plan Note (Signed)
She had cath a year ago, twenty years after PTCA.  No significant obstruction.  Frequent atypical chest pain, nothing that at present sounds ischemic.

## 2011-09-15 NOTE — Progress Notes (Signed)
HPI:  Erin Roberson is doing pretty well.  We spent quite a bit of time on the issue of weather or not to continue or stop her warfarin.  She chronically has some sharp chest pain.  That pain is not too typical, but hard to discern.  She had a more serious episode of pain about a year ago, and that led to a cardiac cath, and subsequent CT which led to a diagnosis of documented PE.  She has been on warfarin since.  She did not have an obvious precipitating factor, and she has not demonstrated some identifiable source such as a malignancy, so deciding what to do with her chronic anticoagulation is difficult.  She was not really just sitting around at the time, and her symptoms are not really changed much from what she had previously.  She is okay with whatever we want to recommend. She and I discussed the pros and cons of various approaches. The top priority is now to have another pulmonary embolism, and she is okay with the idea of continuing on warfarin. She's not having a lot of trouble with regard to following down or being unsteady on her feet.   Current Outpatient Prescriptions  Medication Sig Dispense Refill  . atorvastatin (LIPITOR) 40 MG tablet Take by mouth daily.        . bisacodyl (DULCOLAX) 5 MG EC tablet Take 1 tablet (5 mg total) by mouth daily as needed for constipation. If no bowel movement in 3 days  30 tablet    . calcium-vitamin D (OSCAL WITH D) 500-200 MG-UNIT per tablet Take 1 tablet by mouth daily.        . DONEPEZIL HCL PO Take 1 tablet by mouth daily.       . NON FORMULARY Soln.one drop each eye at bed time (LATANOPROST  0.005%)       . NON FORMULARY Vit D  1 tab po qd       . PROBIOTIC CAPS Take by mouth daily.        . psyllium (METAMUCIL) 58.6 % powder Take by mouth as needed.       . senna (SENOKOT) 8.6 MG tablet Take 1 tablet by mouth as needed.        . warfarin (COUMADIN) 5 MG tablet Take by mouth as directed.          No Known Allergies  Past Medical History  Diagnosis  Date  . Other pulmonary embolism and infarction   . Pure hypercholesterolemia   . Overweight   . Depression   . Unspecified cardiovascular disease   . Iron deficiency anemia, unspecified   . Hemorrhage of rectum and anus   . Leukocytopenia, unspecified   . Urinary hesitancy   . Diverticulosis of colon (without mention of hemorrhage)   . Coronary atherosclerosis   . Glaucoma   . Fibromyalgia     Past Surgical History  Procedure Date  . Cervical spine surgery   . Cataract extraction 2004    right  . Cardiac catheterization 07/25/10  . Abdominal hysterectomy   . Coronary angioplasty 1992  . Colonoscopy 07/01/2007    diverticulosis  . Upper gastrointestinal endoscopy 07/16/2007    tortous esophagus with spasms    Family History  Problem Relation Age of Onset  . Diabetes Mother     deceased age 98  . Pancreatic cancer Father     deceased age 9  . Emphysema Brother     deceased  . Alcohol  abuse Brother   . Colon cancer Neg Hx     History   Social History  . Marital Status: Divorced    Spouse Name: N/A    Number of Children: N/A  . Years of Education: N/A   Occupational History  .     Social History Main Topics  . Smoking status: Former Games developer  . Smokeless tobacco: Never Used  . Alcohol Use: No  . Drug Use: No  . Sexually Active: Not on file   Other Topics Concern  . Not on file   Social History Narrative  . No narrative on file    ROS: Please see the HPI.  All other systems reviewed and negative.  PHYSICAL EXAM:  BP 118/68  Pulse 83  Ht 5\' 8"  (1.727 m)  Wt 68.402 kg (150 lb 12.8 oz)  BMI 22.93 kg/m2  General: Well developed, well nourished, in no acute distress. Head:  Normocephalic and atraumatic. Neck: no JVD Lungs: Clear to auscultation and percussion. Heart: Normal S1 and S2.  No murmur, rubs or gallops.  Abdomen:  Normal bowel sounds; soft; non tender; no organomegaly Pulses: Pulses normal in all 4 extremities. Extremities: No  clubbing or cyanosis. No edema. Neurologic: Alert and oriented x 3.  EKG:  NSR.  Nonspecific T wave abnormality.  ASSESSMENT AND PLAN:

## 2011-09-15 NOTE — Assessment & Plan Note (Signed)
Continue to observe.  See my above note.  She will remain on warfarin at the present time.

## 2011-09-15 NOTE — Assessment & Plan Note (Signed)
See overview.  PE is resolved, however, she does not know what led to this.  No obvious risk factors.  She will remain on warfarin.  Discussed in detail with patient, and that is her preference for now.

## 2011-09-17 ENCOUNTER — Ambulatory Visit (INDEPENDENT_AMBULATORY_CARE_PROVIDER_SITE_OTHER): Payer: Medicare Other | Admitting: *Deleted

## 2011-09-17 DIAGNOSIS — I2699 Other pulmonary embolism without acute cor pulmonale: Secondary | ICD-10-CM

## 2011-09-17 DIAGNOSIS — Z7901 Long term (current) use of anticoagulants: Secondary | ICD-10-CM

## 2011-09-17 LAB — POCT INR: INR: 3.7

## 2011-09-20 ENCOUNTER — Other Ambulatory Visit: Payer: Self-pay | Admitting: Obstetrics & Gynecology

## 2011-09-20 DIAGNOSIS — N644 Mastodynia: Secondary | ICD-10-CM

## 2011-09-20 DIAGNOSIS — N6320 Unspecified lump in the left breast, unspecified quadrant: Secondary | ICD-10-CM

## 2011-10-09 ENCOUNTER — Ambulatory Visit
Admission: RE | Admit: 2011-10-09 | Discharge: 2011-10-09 | Disposition: A | Discharge status: Home / Self Care | Payer: Medicare Other | Source: Ambulatory Visit | Attending: Obstetrics & Gynecology | Admitting: Obstetrics & Gynecology

## 2011-10-09 ENCOUNTER — Ambulatory Visit: Payer: Medicare Other | Admitting: Internal Medicine

## 2011-10-09 DIAGNOSIS — N644 Mastodynia: Secondary | ICD-10-CM

## 2011-10-09 DIAGNOSIS — N6320 Unspecified lump in the left breast, unspecified quadrant: Secondary | ICD-10-CM

## 2011-10-22 ENCOUNTER — Encounter: Payer: Medicare Other | Admitting: *Deleted

## 2011-10-23 ENCOUNTER — Ambulatory Visit (INDEPENDENT_AMBULATORY_CARE_PROVIDER_SITE_OTHER): Payer: Medicare Other | Admitting: Cardiology

## 2011-10-23 ENCOUNTER — Encounter: Payer: Self-pay | Admitting: Cardiology

## 2011-10-23 ENCOUNTER — Ambulatory Visit (INDEPENDENT_AMBULATORY_CARE_PROVIDER_SITE_OTHER): Payer: Medicare Other | Admitting: *Deleted

## 2011-10-23 ENCOUNTER — Telehealth (INDEPENDENT_AMBULATORY_CARE_PROVIDER_SITE_OTHER): Payer: Self-pay | Admitting: Surgery

## 2011-10-23 DIAGNOSIS — Z7901 Long term (current) use of anticoagulants: Secondary | ICD-10-CM

## 2011-10-23 DIAGNOSIS — N649 Disorder of breast, unspecified: Secondary | ICD-10-CM

## 2011-10-23 DIAGNOSIS — N63 Unspecified lump in unspecified breast: Secondary | ICD-10-CM

## 2011-10-23 DIAGNOSIS — I2510 Atherosclerotic heart disease of native coronary artery without angina pectoris: Secondary | ICD-10-CM

## 2011-10-23 DIAGNOSIS — I2699 Other pulmonary embolism without acute cor pulmonale: Secondary | ICD-10-CM

## 2011-10-23 NOTE — Progress Notes (Signed)
HPI:  Patient in for follow up.  She still has sharp chest pain.  She also gets a little dizzy when she stands up.  She had a negative mammogram.  However, she some times feel a pea sized lump in the left breast.  She was not examined at the breast center. However, her study was negative.  She also has had a PE in the past year without an obvious baseline etiology.  As such, and with the pain, we have been reluctant to stop her warfarin.    Current Outpatient Prescriptions  Medication Sig Dispense Refill  . atorvastatin (LIPITOR) 40 MG tablet Take by mouth daily.        . bisacodyl (DULCOLAX) 5 MG EC tablet Take 1 tablet (5 mg total) by mouth daily as needed for constipation. If no bowel movement in 3 days  30 tablet    . calcium-vitamin D (OSCAL WITH D) 500-200 MG-UNIT per tablet Take 1 tablet by mouth daily.        . DONEPEZIL HCL PO Take 1 tablet by mouth daily.       . NON FORMULARY Soln.one drop each eye at bed time (LATANOPROST  0.005%)       . NON FORMULARY Vit D  1 tab po qd       . PROBIOTIC CAPS Take by mouth daily.        . psyllium (METAMUCIL) 58.6 % powder Take by mouth as needed.       . senna (SENOKOT) 8.6 MG tablet Take 1 tablet by mouth as needed.        . warfarin (COUMADIN) 5 MG tablet Take by mouth as directed.          No Known Allergies  Past Medical History  Diagnosis Date  . Other pulmonary embolism and infarction   . Pure hypercholesterolemia   . Overweight   . Depression   . Unspecified cardiovascular disease   . Iron deficiency anemia, unspecified   . Hemorrhage of rectum and anus   . Leukocytopenia, unspecified   . Urinary hesitancy   . Diverticulosis of colon (without mention of hemorrhage)   . Coronary atherosclerosis   . Glaucoma   . Fibromyalgia     Past Surgical History  Procedure Date  . Cervical spine surgery   . Cataract extraction 2004    right  . Cardiac catheterization 07/25/10  . Abdominal hysterectomy   . Coronary angioplasty  1992  . Colonoscopy 07/01/2007    diverticulosis  . Upper gastrointestinal endoscopy 07/16/2007    tortous esophagus with spasms    Family History  Problem Relation Age of Onset  . Diabetes Mother     deceased age 26  . Pancreatic cancer Father     deceased age 66  . Emphysema Brother     deceased  . Alcohol abuse Brother   . Colon cancer Neg Hx     History   Social History  . Marital Status: Divorced    Spouse Name: N/A    Number of Children: N/A  . Years of Education: N/A   Occupational History  .     Social History Main Topics  . Smoking status: Former Games developer  . Smokeless tobacco: Never Used  . Alcohol Use: No  . Drug Use: No  . Sexually Active: Not on file   Other Topics Concern  . Not on file   Social History Narrative  . No narrative on file    ROS:  Please see the HPI.  All other systems reviewed and negative.  PHYSICAL EXAM:  BP 96/60  Pulse 72  Ht 5\' 5"  (1.651 m)  Wt 68.947 kg (152 lb)  BMI 25.29 kg/m2  General: Well developed, well nourished, in no acute distress. Head:  Normocephalic and atraumatic. Neck: no JVD Lungs: Clear to auscultation and percussion. Heart: Normal S1 and S2.  No murmur, rubs or gallops.  Breast:  Small ridge above areola on the left, slightly tender, 1cm by 1cm  ( Abdomen:  Normal bowel sounds; soft; non tender; no organomegaly Pulses: Pulses normal in all 4 extremities. Extremities: No clubbing or cyanosis. No edema. Neurologic: Alert and oriented x 3.  EKG:  ASSESSMENT AND PLAN:

## 2011-10-23 NOTE — Patient Instructions (Addendum)
Your physician recommends that you schedule a follow-up appointment in: 3 MONTHS  Your physician recommends that you have lab work today: CBC  You have been referred to Dr Ovidio Kin for evaluation on knot in left breast. ([address]---#[phone])

## 2011-10-23 NOTE — Telephone Encounter (Signed)
The appointment was moved up and called to Dr Rosalyn Charters office.

## 2011-10-23 NOTE — Telephone Encounter (Signed)
Dr. Rosalyn Charters office called to schedule this patient and would like to have the patient seen sooner if possible, please call.

## 2011-10-24 LAB — CBC WITH DIFFERENTIAL/PLATELET
Basophils Absolute: 0 10*3/uL (ref 0.0–0.1)
Eosinophils Absolute: 0.1 10*3/uL (ref 0.0–0.7)
MCHC: 33.6 g/dL (ref 30.0–36.0)
MCV: 97.1 fl (ref 78.0–100.0)
Monocytes Absolute: 0.3 10*3/uL (ref 0.1–1.0)
Neutrophils Relative %: 46.6 % (ref 43.0–77.0)
Platelets: 210 10*3/uL (ref 150.0–400.0)

## 2011-10-28 DIAGNOSIS — N649 Disorder of breast, unspecified: Secondary | ICD-10-CM | POA: Insufficient documentation

## 2011-10-28 NOTE — Assessment & Plan Note (Signed)
See my discussion regarding this.  My concern is for risk of recurrent PE with discontinuation, and ability to recognize.  If warfarin became a problem, then it could be discontinued.

## 2011-10-28 NOTE — Assessment & Plan Note (Signed)
We have debated about her long term warfarin.   I am concerned in part because she always has many of the same symtpoms she had before this discovery, and yet her PA are normal by CT.  She also had no obvious precipitation event.  We will continue warfarin for the present time, and monitor her closely.

## 2011-10-28 NOTE — Assessment & Plan Note (Signed)
Last cath data reviewed previously.  No active lesions at present.

## 2011-10-28 NOTE — Assessment & Plan Note (Signed)
She had a neg mammogram.  See overview.  I can appreciate, as can our nursing staff, what she is pointing to.  It is small, but will refer to Gen Surg for an appropriate exam to determine if further evaluation is warranted.

## 2011-10-30 ENCOUNTER — Ambulatory Visit (INDEPENDENT_AMBULATORY_CARE_PROVIDER_SITE_OTHER): Payer: Medicare Other | Admitting: Internal Medicine

## 2011-10-30 ENCOUNTER — Encounter: Payer: Self-pay | Admitting: Internal Medicine

## 2011-10-30 VITALS — BP 100/60 | HR 60 | Ht 64.0 in | Wt 152.0 lb

## 2011-10-30 DIAGNOSIS — K5900 Constipation, unspecified: Secondary | ICD-10-CM

## 2011-10-30 DIAGNOSIS — K5909 Other constipation: Secondary | ICD-10-CM

## 2011-10-30 MED ORDER — POLYETHYLENE GLYCOL 3350 17 GM/SCOOP PO POWD: ORAL | Status: DC

## 2011-10-30 NOTE — Patient Instructions (Signed)
Take Miralax 17 grams in 6-8 ounces of liquid daily, may take up to twice a day if needed. Take Senokot as needed or if you haven't had a bowel movement in 2-3 days.

## 2011-10-30 NOTE — Progress Notes (Signed)
Subjective:    Patient ID: Erin Roberson, female    DOB: [birth year], 78 y.o.   MRN: [id number]  HPI Delightful elderly black woman with recurrent abdominal pain. She is having increasing symptoms lately with difficulty producing a stool and bilateral lower quadrant abdominal pain prior to defecation. Once she is able to defecate she feels ok. No bleeding though last PM she tried to self-disimpact some and saw a streak ofred blood with wiping (? From her nails). When she was here in November daily MiraLax was recommended - she stopped usung that daily and cannot remember why. She says Senokot works better than Dulcolax. No Known Allergies Outpatient Prescriptions Prior to Visit  Medication Sig Dispense Refill  . atorvastatin (LIPITOR) 40 MG tablet Take by mouth daily.        . calcium-vitamin D (OSCAL WITH D) 500-200 MG-UNIT per tablet Take 1 tablet by mouth daily.        . DONEPEZIL HCL PO Take 1 tablet by mouth daily.       . NON FORMULARY Soln.one drop each eye at bed time (LATANOPROST  0.005%)       . NON FORMULARY Vit D  1 tab po qd       . PROBIOTIC CAPS Take by mouth daily.        . psyllium (METAMUCIL) 58.6 % powder Take by mouth as needed.       . warfarin (COUMADIN) 5 MG tablet Take by mouth as directed.        . bisacodyl (DULCOLAX) 5 MG EC tablet Take 1 tablet (5 mg total) by mouth daily as needed for constipation. If no bowel movement in 3 days  30 tablet    . senna (SENOKOT) 8.6 MG tablet Take 1 tablet by mouth as needed.         Past Medical History  Diagnosis Date  . Other pulmonary embolism and infarction   . Pure hypercholesterolemia   . Overweight   . Depression   . Unspecified cardiovascular disease   . Iron deficiency anemia, unspecified   . Hemorrhage of rectum and anus   . Leukocytopenia, unspecified   . Urinary hesitancy   . Diverticulosis of colon (without mention of hemorrhage)   . Coronary atherosclerosis   . Glaucoma   . Fibromyalgia   . UTI (lower  urinary tract infection)    Past Surgical History  Procedure Date  . Cervical spine surgery   . Cataract extraction 2004    right  . Cardiac catheterization 07/25/10  . Abdominal hysterectomy   . Coronary angioplasty 1992  . Colonoscopy 07/01/2007    diverticulosis  . Upper gastrointestinal endoscopy 07/16/2007    tortous esophagus with spasms   History   Social History  . Marital Status: Divorced    Social History Main Topics  . Smoking status: Former Games developer  . Smokeless tobacco: Never Used  . Alcohol Use: No  . Drug Use: No  . Sexually Active: None     Family History  Problem Relation Age of Onset  . Diabetes Mother     deceased age 57  . Pancreatic cancer Father     deceased age 67  . Emphysema Brother     deceased  . Alcohol abuse Brother   . Colon cancer Neg Hx          Review of Systems She tried to find [address] instead of [address] today - she forgot where the office was.Living  alone - once in a while she says she gets confused.    Objective:   Physical Exam Well-developed elderly black woman in acute distress, looks younger than stated age Abdomen is soft and nontender, sounds are present,  lower abdominal pelvic surgical scars present, bowel sounds are present Rectal exam female staff present shows a normal anoderm. Digital rectal exam reveals a fair number of pellet-like brown stool balls but no impaction.       Assessment & Plan:

## 2011-10-30 NOTE — Assessment & Plan Note (Signed)
She has severe diverticulosis in her stool patterns are consistent with this being at least part of the problem. Think her abdominal pain prior to defecation is related to this as well. Hopefully getting back on daily MiraLax will relieve this problem.

## 2011-10-30 NOTE — Assessment & Plan Note (Signed)
She's having a flare of this. She stopped using her MiraLax on a daily basis and I think that's why. She will reinstitute that. Use of Senokot as needed every 2-3 days. Follow with me as needed.

## 2011-11-08 ENCOUNTER — Ambulatory Visit (INDEPENDENT_AMBULATORY_CARE_PROVIDER_SITE_OTHER): Payer: Medicare Other | Admitting: Surgery

## 2011-11-27 ENCOUNTER — Ambulatory Visit (INDEPENDENT_AMBULATORY_CARE_PROVIDER_SITE_OTHER): Payer: Medicare Other | Admitting: Pharmacist

## 2011-11-27 DIAGNOSIS — Z7901 Long term (current) use of anticoagulants: Secondary | ICD-10-CM

## 2011-11-27 DIAGNOSIS — I2699 Other pulmonary embolism without acute cor pulmonale: Secondary | ICD-10-CM

## 2011-11-27 LAB — POCT INR: INR: 2

## 2011-11-29 ENCOUNTER — Ambulatory Visit (INDEPENDENT_AMBULATORY_CARE_PROVIDER_SITE_OTHER): Payer: Medicare Other | Admitting: Surgery

## 2011-11-29 ENCOUNTER — Encounter (INDEPENDENT_AMBULATORY_CARE_PROVIDER_SITE_OTHER): Payer: Self-pay | Admitting: Surgery

## 2011-11-29 DIAGNOSIS — N649 Disorder of breast, unspecified: Secondary | ICD-10-CM

## 2011-11-29 NOTE — Progress Notes (Signed)
Re:   Erin Roberson DOB:   1924-03-15 MRN:   [id number]  ASSESSMENT AND PLAN: 1.  Left breast mass.  Negative mammogram - 10/09/2011 - comment made about fatty area in OUQ of left breast.  Fibroglandular changes on PE.  Neg [place].  I think the changes are benign.  I offered to see her back in 6 months to recheck and make sure nothing has changed.  2.  History of PE. 3.  Diverticulosis. 4.  Coronary artery disease. 5.  Fibromyalgia.  Diagnosed by Dr. Philis Kendall. 6.  Dementia. 7.  On coumadin. INR - 2.0 - 11/27/2011.    Chief Complaint  Patient presents with  . Breast Mass    new pt- eval L breast   REFERRING PHYSICIAN: Dr. Ermalene Postin.  HISTORY OF PRESENT ILLNESS: Erin Roberson is a 78 y.o. (DOB: 05/15/1924)  AA female whose primary care physician is Eino Farber, MD, MD and comes to me today for left breast mass.  She has had occasional left breast pain over the last 3 months.  She thinks she feels a mass. She had a left breast biopsy over 30 years ago at the 6 o'clock position which was benign.  Otherwise has no breast history. She has no family history of breast cancer.  Her mother lived to be 105.   Past Medical History  Diagnosis Date  . Other pulmonary embolism and infarction   . Pure hypercholesterolemia   . Overweight   . Depression   . Unspecified cardiovascular disease   . Iron deficiency anemia, unspecified   . Hemorrhage of rectum and anus   . Leukocytopenia, unspecified   . Urinary hesitancy   . Diverticulosis of colon (without mention of hemorrhage)   . Coronary atherosclerosis   . Glaucoma   . Fibromyalgia   . UTI (lower urinary tract infection)   . History of uterine cancer   . Clotting disorder   . Thyroid disease   . Dementia   . Breast mass     left breast      Past Surgical History  Procedure Date  . Cataract extraction 2004    right  . Cardiac catheterization 07/25/10  . Abdominal hysterectomy   . Coronary angioplasty 1992  .  Colonoscopy 07/01/2007    diverticulosis  . Upper gastrointestinal endoscopy 07/16/2007    tortous esophagus with spasms      Current Outpatient Prescriptions  Medication Sig Dispense Refill  . atorvastatin (LIPITOR) 40 MG tablet Take by mouth daily.        . calcium-vitamin D (OSCAL WITH D) 500-200 MG-UNIT per tablet Take 1 tablet by mouth daily.        . Cholecalciferol (VITAMIN D PO) Take by mouth daily.      . DONEPEZIL HCL PO Take 1 tablet by mouth daily.       Marland Kitchen latanoprost (XALATAN) 0.005 % ophthalmic solution 1 drop at bedtime.      . polyethylene glycol powder (GLYCOLAX/MIRALAX) powder Take 17 gm in 6-8 ounces of liquid by mouth daily, may take up to twice a day if needed  255 g  0  . PROBIOTIC CAPS Take by mouth daily.        . psyllium (METAMUCIL) 58.6 % powder Take by mouth as needed.       . senna (SENOKOT) 8.6 MG tablet Take 1 tablet by mouth as needed.        . warfarin (COUMADIN) 5 MG tablet Take  by mouth as directed.           No Known Allergies  REVIEW OF SYSTEMS: Skin:  No history of rash.  No history of abnormal moles. Infection:  No history of hepatitis or HIV.  No history of MRSA. Neurologic:  No history of stroke.  No history of seizure.  No history of headaches. Cardiac:  Sees Dr. Ermalene Postin from a cardiology standpoint. Pulmonary:  History of PE.  Endocrine:  No diabetes. No thyroid disease. Gastrointestinal:  History of constipation. Diverticulosis.  Sees Dr. Sharia Reeve. Urologic:  No history of kidney stones.  No history of bladder infections. Musculoskeletal:  Carries diagnosis of fibromyalgia, but I am not sure she understands this. Hematologic:  Anticoagulated for PE history Psycho-social:  The patient is oriented.     SOCIAL and FAMILY HISTORY: Divorced.  She has one daughter who lives in [place].  PHYSICAL EXAM: BP 122/80  Pulse 72  Temp(Src) 97.2 F (36.2 C) (Temporal)  Resp 16  Ht 5\' 3"  (1.6 m)  Wt 151 lb 6.4 oz (68.675 kg)  BMI 26.82  kg/m2  General: WN AA Older F who is alert and generally healthy appearing.  HEENT: Normal. Pupils equal. Good dentition. Neck: Supple. No mass.  No thyroid mass.  Carotid pulse okay with no bruit. Lymph Nodes:  No supraclavicular, cervical nodes or axillary mass.. Lungs: Clear to auscultation and symmetric breath sounds. Heart:  RRR. No murmur or rub. Breasts:  Right: Somewhat lumpy, no mass or nodule.  Left:  Somewhat lumpy, but no mass or nodule.  The area she points to is 12 o'clock position.  Abdomen: Soft. No mass. No tenderness. No hernia. Normal bowel sounds.  No abdominal scars. Rectal: Not done. Extremities:  Good strength and ROM  in upper and lower extremities. Neurologic:  Grossly intact to motor and sensory function. Psychiatric: Has normal mood and affect. Behavior is normal.   Procedure:  While in the office, I did an [place] of her left breast.  I saw no mass or area of concern.  DATA REVIEWED: Mammogram negative Jan 2013.  Ovidio Kin, MD,  [facility] [address] [place]  [address]   [place], [place]    [address] Phone:  [phone] FAX:  [phone]

## 2011-12-13 ENCOUNTER — Ambulatory Visit (INDEPENDENT_AMBULATORY_CARE_PROVIDER_SITE_OTHER): Payer: Medicare Other | Admitting: Internal Medicine

## 2011-12-13 ENCOUNTER — Encounter: Payer: Self-pay | Admitting: Internal Medicine

## 2011-12-13 VITALS — BP 112/78 | HR 89 | Ht 63.0 in | Wt 150.0 lb

## 2011-12-13 DIAGNOSIS — R1032 Left lower quadrant pain: Secondary | ICD-10-CM

## 2011-12-13 DIAGNOSIS — F0390 Unspecified dementia without behavioral disturbance: Secondary | ICD-10-CM | POA: Insufficient documentation

## 2011-12-13 DIAGNOSIS — K5900 Constipation, unspecified: Secondary | ICD-10-CM

## 2011-12-13 DIAGNOSIS — G8929 Other chronic pain: Secondary | ICD-10-CM

## 2011-12-13 DIAGNOSIS — K5909 Other constipation: Secondary | ICD-10-CM

## 2011-12-13 NOTE — Assessment & Plan Note (Signed)
Increase MiraLax to bid

## 2011-12-13 NOTE — Patient Instructions (Signed)
Take your miralax twice a day.

## 2011-12-13 NOTE — Progress Notes (Signed)
  Subjective:    Patient ID: Erin Roberson, female    DOB: 1924/01/16, 78 y.o.   MRN: [id number]  HPI This pleasant 78 year old African American woman returns. I saw her in January. She had left lower quadrant pain and constipation problems in that she has had for years. High discussed using her Senokot and MiraLax. She is taking MiraLax daily, and Senokot about every other day. She is moving her bowels better but she still has some left lower quadrant pain that is intermittent. It's the same sort of pain she has had for many years. It is no progressive change in bowel habits or bleeding reported. He is somewhat fearful of having cancer. Last colonoscopy 2008 with diverticulosis and hemorrhoids.  Medications, allergies, past medical and surgical history reviewed in the EMR. Review of Systems Memory disturbance    Objective:   Physical Exam Developed well-nourished elderly woman in no acute distress Back no CVA tenderness Abdomen is soft, there is some mild left lower quadrant tenderness to deep palpation no organomegaly or mass not detecting an abdominal wall hernia      Assessment & Plan:  Please see the problem oriented charting. I will send a copy to Dr. Petra Kuba

## 2011-12-13 NOTE — Assessment & Plan Note (Signed)
She has had this for many years, going back over a decade. It is typically been related to constipation and still is. I have reassured her today. Colonoscopy in 2008 was unrevealing she has had other imaging including ultrasound and CT scanning over the years though none very recently. Given the persistence in consistency of his symptoms without progressive change she will try increasing her MiraLax to twice daily as defecation seems to relieve his pain. I think it's probably in part due to her diverticulosis and perhaps some scar tissue or adhesions from prior hysterectomy. I will see her back as needed.

## 2011-12-25 ENCOUNTER — Ambulatory Visit: Payer: Medicare Other | Admitting: Cardiology

## 2012-01-01 ENCOUNTER — Ambulatory Visit (INDEPENDENT_AMBULATORY_CARE_PROVIDER_SITE_OTHER): Payer: Medicare Other | Admitting: Pharmacist

## 2012-01-01 DIAGNOSIS — Z7901 Long term (current) use of anticoagulants: Secondary | ICD-10-CM

## 2012-01-01 DIAGNOSIS — I2699 Other pulmonary embolism without acute cor pulmonale: Secondary | ICD-10-CM

## 2012-01-01 LAB — POCT INR: INR: 1.6

## 2012-01-22 ENCOUNTER — Encounter: Payer: Self-pay | Admitting: Cardiology

## 2012-01-22 ENCOUNTER — Ambulatory Visit (INDEPENDENT_AMBULATORY_CARE_PROVIDER_SITE_OTHER): Payer: Medicare Other | Admitting: Cardiology

## 2012-01-22 ENCOUNTER — Ambulatory Visit (INDEPENDENT_AMBULATORY_CARE_PROVIDER_SITE_OTHER): Payer: Medicare Other | Admitting: *Deleted

## 2012-01-22 VITALS — BP 126/62 | HR 72 | Ht 62.0 in | Wt 151.0 lb

## 2012-01-22 DIAGNOSIS — Z7901 Long term (current) use of anticoagulants: Secondary | ICD-10-CM

## 2012-01-22 DIAGNOSIS — I2510 Atherosclerotic heart disease of native coronary artery without angina pectoris: Secondary | ICD-10-CM

## 2012-01-22 DIAGNOSIS — E7800 Pure hypercholesterolemia, unspecified: Secondary | ICD-10-CM

## 2012-01-22 DIAGNOSIS — I2699 Other pulmonary embolism without acute cor pulmonale: Secondary | ICD-10-CM

## 2012-01-22 NOTE — Patient Instructions (Signed)
Your physician recommends that you schedule a follow-up appointment in: 3 MONTHS  Your physician recommends that you continue on your current medications as directed. Please refer to the Current Medication list given to you today.   

## 2012-01-27 NOTE — Assessment & Plan Note (Signed)
I have been very reluctant to stop warfarin since she had a PE that was hard to determine on arrival.  She also had chest pain not infrequently, and the PE was not a minor one  (see report and location in section of overview).  She is not all that active.  I have told that to her primary MD who has followed her closely and well over the years.  We would like to continue with warfarin for now, and she feels more comfortable with this as well.

## 2012-01-27 NOTE — Assessment & Plan Note (Signed)
Last cath results noted.  Unlikely any chest pain represents CAD.

## 2012-01-27 NOTE — Assessment & Plan Note (Signed)
Has follow up of this in Dr. Consuello Bossier office.

## 2012-01-27 NOTE — Progress Notes (Signed)
HPI:  Erin Roberson is in for follow up.  She seems pretty steady, and she still has the occasional sharp chest pain.  This has not changed any.  She does get a little lightheaded in the morning.  She awakens around 930 am, and notes that it takes a while after she is up and around not to be a bit dizzy.  She does not come close to falling.  She does get from time to time a sharp left chest pain that has not changed.  She also so general surgery with Dr. Ezzard Standing.    Current Outpatient Prescriptions  Medication Sig Dispense Refill  . atorvastatin (LIPITOR) 40 MG tablet Take by mouth daily.        . calcium-vitamin D (OSCAL WITH D) 500-200 MG-UNIT per tablet Take 1 tablet by mouth daily.        . Cholecalciferol (VITAMIN D PO) Take by mouth daily.      . DONEPEZIL HCL PO Take 1 tablet by mouth daily.       Marland Kitchen latanoprost (XALATAN) 0.005 % ophthalmic solution 1 drop at bedtime.      . polyethylene glycol powder (GLYCOLAX/MIRALAX) powder Take 17 gm in 6-8 ounces of liquid by mouth daily, may take up to twice a day if needed  255 g  0  . PROBIOTIC CAPS Take by mouth daily.        . psyllium (METAMUCIL) 58.6 % powder Take by mouth as needed.       . senna (SENOKOT) 8.6 MG tablet Take 1 tablet by mouth as needed.        . warfarin (COUMADIN) 5 MG tablet Take by mouth as directed.          No Known Allergies  Past Medical History  Diagnosis Date  . Other pulmonary embolism and infarction   . Pure hypercholesterolemia   . Overweight   . Depression   . Unspecified cardiovascular disease   . Iron deficiency anemia, unspecified   . Hemorrhage of rectum and anus   . Leukocytopenia, unspecified   . Urinary hesitancy   . Diverticulosis of colon (without mention of hemorrhage)   . Coronary atherosclerosis   . Glaucoma   . Fibromyalgia   . UTI (lower urinary tract infection)   . History of uterine cancer   . Clotting disorder   . Thyroid disease   . Dementia   . Breast mass     left breast  .  Leukopenia     chronic--benign    Past Surgical History  Procedure Date  . Cataract extraction 2004    right  . Cardiac catheterization 07/25/10  . Abdominal hysterectomy   . Coronary angioplasty 1992  . Colonoscopy 07/01/2007    diverticulosis  . Upper gastrointestinal endoscopy 07/16/2007    tortous esophagus with spasms  . Thyroid needle aspiration 12/2007    right, hyperplastic nodule  . Kidney surgery     stent placed  . Neck surgery     Family History  Problem Relation Age of Onset  . Diabetes Mother     deceased age 55  . Pancreatic cancer Father     deceased age 88  . Cancer Father     pancreatic  . Emphysema Brother     deceased  . Alcohol abuse Brother   . Colon cancer Neg Hx     History   Social History  . Marital Status: Divorced    Spouse Name: N/A  Number of Children: N/A  . Years of Education: N/A   Occupational History  .     Social History Main Topics  . Smoking status: Never Smoker   . Smokeless tobacco: Never Used  . Alcohol Use: No  . Drug Use: No  . Sexually Active: Not on file   Other Topics Concern  . Not on file   Social History Narrative  . No narrative on file    ROS: Please see the HPI.  All other systems reviewed and negative.  PHYSICAL EXAM:  BP 126/62  Pulse 72  Ht 5\' 2"  (1.575 m)  Wt 151 lb (68.493 kg)  BMI 27.62 kg/m2  BP by me supine 120/70.  Standing after several minutes 120/70.  General: Well developed, well nourished, in no acute distress. Head:  Normocephalic and atraumatic. Neck: no JVD Lungs: Clear to auscultation and percussion. Heart: Normal S1 and S2.  No murmur, rubs or gallops.  Pulses: Pulses normal in all 4 extremities. Extremities: No clubbing or cyanosis. No edema. Neurologic: Alert and oriented x 3.  EKG:  NSR.  Delay in R wave progression.    ASSESSMENT AND PLAN:

## 2012-02-15 ENCOUNTER — Ambulatory Visit (INDEPENDENT_AMBULATORY_CARE_PROVIDER_SITE_OTHER): Payer: Medicare Other

## 2012-02-15 DIAGNOSIS — Z7901 Long term (current) use of anticoagulants: Secondary | ICD-10-CM

## 2012-02-15 DIAGNOSIS — I2699 Other pulmonary embolism without acute cor pulmonale: Secondary | ICD-10-CM

## 2012-03-10 ENCOUNTER — Ambulatory Visit (INDEPENDENT_AMBULATORY_CARE_PROVIDER_SITE_OTHER): Payer: Medicare Other

## 2012-03-10 DIAGNOSIS — Z7901 Long term (current) use of anticoagulants: Secondary | ICD-10-CM

## 2012-03-10 DIAGNOSIS — I2699 Other pulmonary embolism without acute cor pulmonale: Secondary | ICD-10-CM

## 2012-03-10 LAB — POCT INR: INR: 2.6

## 2012-03-20 ENCOUNTER — Encounter (HOSPITAL_COMMUNITY): Payer: Self-pay | Admitting: *Deleted

## 2012-03-20 ENCOUNTER — Emergency Department (HOSPITAL_COMMUNITY)
Admission: EM | Admit: 2012-03-20 | Discharge: 2012-03-21 | Disposition: A | Discharge status: Home / Self Care | Payer: Medicare Other | Attending: Emergency Medicine | Admitting: Emergency Medicine

## 2012-03-20 ENCOUNTER — Emergency Department (HOSPITAL_COMMUNITY): Payer: Medicare Other

## 2012-03-20 DIAGNOSIS — R0789 Other chest pain: Secondary | ICD-10-CM | POA: Insufficient documentation

## 2012-03-20 DIAGNOSIS — Z7901 Long term (current) use of anticoagulants: Secondary | ICD-10-CM | POA: Insufficient documentation

## 2012-03-20 DIAGNOSIS — Z8542 Personal history of malignant neoplasm of other parts of uterus: Secondary | ICD-10-CM | POA: Insufficient documentation

## 2012-03-20 DIAGNOSIS — E7800 Pure hypercholesterolemia, unspecified: Secondary | ICD-10-CM | POA: Insufficient documentation

## 2012-03-20 DIAGNOSIS — IMO0001 Reserved for inherently not codable concepts without codable children: Secondary | ICD-10-CM | POA: Insufficient documentation

## 2012-03-20 DIAGNOSIS — Z86711 Personal history of pulmonary embolism: Secondary | ICD-10-CM | POA: Insufficient documentation

## 2012-03-20 DIAGNOSIS — E079 Disorder of thyroid, unspecified: Secondary | ICD-10-CM | POA: Insufficient documentation

## 2012-03-20 DIAGNOSIS — I2510 Atherosclerotic heart disease of native coronary artery without angina pectoris: Secondary | ICD-10-CM | POA: Insufficient documentation

## 2012-03-20 LAB — BASIC METABOLIC PANEL
Calcium: 9.3 mg/dL (ref 8.4–10.5)
GFR calc Af Amer: 66 mL/min — ABNORMAL LOW (ref >90)
GFR calc non Af Amer: 57 mL/min — ABNORMAL LOW (ref >90)
Potassium: 4.1 mEq/L (ref 3.5–5.1)
Sodium: 140 mEq/L (ref 135–145)

## 2012-03-20 LAB — CBC
MCH: 31.6 pg (ref 26.0–34.0)
MCHC: 33.7 g/dL (ref 30.0–36.0)
RDW: 13.5 % (ref 11.5–15.5)

## 2012-03-20 LAB — POCT I-STAT TROPONIN I: Troponin i, poc: 0 ng/mL (ref 0.00–0.08)

## 2012-03-20 LAB — PROTIME-INR: INR: 2.13 — ABNORMAL HIGH (ref 0.00–1.49)

## 2012-03-20 NOTE — Discharge Instructions (Signed)
Chest Pain (Nonspecific) Chest pain has many causes. Your pain could be caused by something serious, such as a heart attack or a blood clot in the lungs. It could also be caused by something less serious, such as a chest bruise or a virus. Follow up with your doctor. More lab tests or other studies may be needed to find the cause of your pain. Most of the time, nonspecific chest pain will improve within 2 to 3 days of rest and mild pain medicine. HOME CARE  For chest bruises, you may put ice on the sore area for 15 to 20 minutes, 3 to 4 times a day. Do this only if it makes you or your child feel better.   Put ice in a plastic bag.   Place a towel between the skin and the bag.   Rest for the next 2 to 3 days.   Go back to work if the pain improves.   See your doctor if the pain lasts longer than 1 to 2 weeks.   Only take medicine as told by your doctor.   Quit smoking if you smoke.  GET HELP RIGHT AWAY IF:   There is more pain or pain that spreads to the arm, neck, jaw, back, or belly (abdomen).   You or your child has shortness of breath.   You or your child coughs more than usual or coughs up blood.   You or your child has very bad back or belly pain, feels sick to his or her stomach (nauseous), or throws up (vomits).   You or your child has very bad weakness.   You or your child passes out (faints).   You or your child has a temperature by mouth above 102 F (38.9 C), not controlled by medicine.  Any of these problems may be serious and may be an emergency. Do not wait to see if the problems will go away. Get medical help right away. Call your local emergency services 911 in [place] Do not drive yourself to the hospital. MAKE SURE YOU:   Understand these instructions.   Will watch this condition.   Will get help right away if you or your child is not doing well or gets worse.  Document Released: 03/05/2008 Document Revised: 09/06/2011 Document Reviewed:  03/05/2008 [employer] Patient Information 2012 [employer]. 

## 2012-03-20 NOTE — ED Notes (Signed)
Patient denies pain and is resting comfortably.  Reports intermittent sharp pain Left of sternum that occurs three time daily. Lungs clear bases but exp wheeze bilat apex of fields. Pt also reports she has positive for green mucus usually in the AM

## 2012-03-20 NOTE — ED Notes (Signed)
Pt states that she normally wakes up dizziness but it goes away through out the day. Pt states that she was also having a swelling sensation in both legs that then would start up her body and go across the chest. Pt states she feels tired. Pt alert and oriented. Pt ambulatory.

## 2012-03-20 NOTE — ED Notes (Signed)
Pt reports that chest pain and leg pain sensation of pressure and tightness seen by PMD given Lyric that made her nauseated. IVG site and labs obtained. Pt reports currently feels good .

## 2012-03-20 NOTE — ED Notes (Signed)
Pt. C/o leg "tightness and squeezing" for the past year. States this sensation began to radiate up right side of body 2 months ago and now is radiating across her chest for the past week. Intermittent sharp pain on left side mid clavicular chest, and pain under left arm with movement. No edema, pulses radial and pedal pulses strong bilaterally, denies pain currently. Pt also c/o "a dull dizziness/lightheadedness" upon waking in the morning but diminishes throughout the day. States this has been going on for about a year. Pt. Denies dizziness currently.

## 2012-03-20 NOTE — ED Provider Notes (Addendum)
History     CSN: [id number]  Arrival date & time 03/20/12  1645   First MD Initiated Contact with Patient 03/20/12 1843      Chief Complaint  Patient presents with  . Chest Pain  . Leg Swelling  . Dizziness    (Consider location/radiation/quality/duration/timing/severity/associated sxs/prior treatment) HPI Presents with tightness in bilateral legs and tightness in chest intermittent for the past 3 months. Reports feeling worse over the past 2 weeks. Take this in her chest lasts approximately 3 hours at a time and occurs at different times than her leg tightness. She is presently asymptomatic. She's been told by Dr. to Luisa Hart that she suffers from fibromyalgia for these complaints. He has treated her with Lyrica however she discontinued Lyrica due to adverse side effects. Patient is presently asymptomatic. No other associated symptoms. Other symptoms include waking up with phlegm in her throat for the past several days. No other associated symptoms Past Medical History  Diagnosis Date  . Other pulmonary embolism and infarction   . Pure hypercholesterolemia   . Overweight   . Depression   . Unspecified cardiovascular disease   . Iron deficiency anemia, unspecified   . Hemorrhage of rectum and anus   . Leukocytopenia, unspecified   . Urinary hesitancy   . Diverticulosis of colon (without mention of hemorrhage)   . Coronary atherosclerosis   . Glaucoma   . Fibromyalgia   . UTI (lower urinary tract infection)   . History of uterine cancer   . Clotting disorder   . Thyroid disease   . Dementia   . Breast mass     left breast  . Leukopenia     chronic--benign    Past Surgical History  Procedure Date  . Cataract extraction 2004    right  . Cardiac catheterization 07/25/10  . Abdominal hysterectomy   . Coronary angioplasty 1992  . Colonoscopy 07/01/2007    diverticulosis  . Upper gastrointestinal endoscopy 07/16/2007    tortous esophagus with spasms  . Thyroid needle  aspiration 12/2007    right, hyperplastic nodule  . Kidney surgery     stent placed  . Neck surgery     Family History  Problem Relation Age of Onset  . Diabetes Mother     deceased age 51  . Pancreatic cancer Father     deceased age 65  . Cancer Father     pancreatic  . Emphysema Brother     deceased  . Alcohol abuse Brother   . Colon cancer Neg Hx     History  Substance Use Topics  . Smoking status: Never Smoker   . Smokeless tobacco: Never Used  . Alcohol Use: No    OB History    Grav Para Term Preterm Abortions TAB SAB Ect Mult Living                  Review of Systems  Constitutional: Negative.   HENT: Negative.   Respiratory: Positive for chest tightness.   Cardiovascular: Negative.   Gastrointestinal: Negative.   Musculoskeletal: Positive for myalgias.  Skin: Negative.   Neurological: Negative.   Hematological: Negative.   Psychiatric/Behavioral: Negative.     Allergies  Review of patient's allergies indicates no known allergies.  Home Medications   Current Outpatient Rx  Name Route Sig Dispense Refill  . ATORVASTATIN CALCIUM 40 MG PO TABS Oral Take by mouth daily.      Marland Kitchen CALCIUM CARBONATE-VITAMIN D 500-200 MG-UNIT PO TABS Oral Take 1  tablet by mouth daily.      Marland Kitchen VITAMIN D PO Oral Take by mouth daily.    . DONEPEZIL HCL PO Oral Take 1 tablet by mouth daily.     Marland Kitchen LATANOPROST 0.005 % OP SOLN  1 drop at bedtime.    Marland Kitchen POLYETHYLENE GLYCOL 3350 PO POWD  Take 17 gm in 6-8 ounces of liquid by mouth daily, may take up to twice a day if needed 255 g 0  . PROBIOTIC PO CAPS Oral Take by mouth daily.      . PSYLLIUM 58.6 % PO POWD Oral Take by mouth as needed.     . SENNOSIDES 8.6 MG PO TABS Oral Take 1 tablet by mouth as needed.      . WARFARIN SODIUM 5 MG PO TABS Oral Take by mouth as directed.        BP 105/50  Pulse 83  Temp 98 F (36.7 C) (Oral)  Resp 20  Ht 5\' 3"  (1.6 m)  Wt 152 lb (68.947 kg)  BMI 26.93 kg/m2  SpO2 97%  Physical Exam    Nursing note and vitals reviewed. Constitutional: She appears well-developed and well-nourished.  HENT:  Head: Normocephalic and atraumatic.  Eyes: Conjunctivae are normal. Pupils are equal, round, and reactive to light.  Neck: Neck supple. No tracheal deviation present. No thyromegaly present.  Cardiovascular: Normal rate and regular rhythm.   No murmur heard. Pulmonary/Chest: Effort normal and breath sounds normal.  Abdominal: Soft. Bowel sounds are normal. She exhibits no distension. There is no tenderness.  Musculoskeletal: Normal range of motion. She exhibits no edema and no tenderness.  Neurological: She is alert. Coordination normal.  Skin: Skin is warm and dry. No rash noted.  Psychiatric: She has a normal mood and affect.    ED Course  Procedures (including critical care time) Remains asymptomatic at 11:55 PM. Patient alert pleasant cooperative   Labs Reviewed - No data to display No results found.   Date: 03/20/2012  Rate: 80  Rhythm: normal sinus rhythm  QRS Axis: normal  Intervals: normal  ST/T Wave abnormalities: nonspecific T wave changes  Conduction Disutrbances:none  Narrative Interpretation:   Old EKG Reviewed: Unchanged from 07/26/2010 interpreted by me  No diagnosis found.  Results for orders placed during the hospital encounter of 03/20/12  CBC      Component Value Range   WBC 4.9  4.0 - 10.5 K/uL   RBC 4.05  3.87 - 5.11 MIL/uL   Hemoglobin 12.8  12.0 - 15.0 g/dL   HCT 40.9  81.1 - 91.4 %   MCV 93.8  78.0 - 100.0 fL   MCH 31.6  26.0 - 34.0 pg   MCHC 33.7  30.0 - 36.0 g/dL   RDW 78.2  95.6 - 21.3 %   Platelets 204  150 - 400 K/uL  BASIC METABOLIC PANEL      Component Value Range   Sodium 140  135 - 145 mEq/L   Potassium 4.1  3.5 - 5.1 mEq/L   Chloride 105  96 - 112 mEq/L   CO2 25  19 - 32 mEq/L   Glucose, Bld 89  70 - 99 mg/dL   BUN 17  6 - 23 mg/dL   Creatinine, Ser 0.86  0.50 - 1.10 mg/dL   Calcium 9.3  8.4 - 57.8 mg/dL   GFR calc non Af  Amer 57 (*) >90 mL/min   GFR calc Af Amer 66 (*) >90 mL/min  PROTIME-INR  Component Value Range   Prothrombin Time 24.2 (*) 11.6 - 15.2 seconds   INR 2.13 (*) 0.00 - 1.49  POCT I-STAT TROPONIN I      Component Value Range   Troponin i, poc 0.00  0.00 - 0.08 ng/mL   Comment 3            Dg Chest 2 View  03/20/2012  *RADIOLOGY REPORT*  Clinical Data: Chest pain.  Leg swelling.  Dizziness.  CHEST - 2 VIEW  Comparison: 07/24/2010  Findings: Heart size and vascularity are normal and the lungs are clear.  Slight thoracic scoliosis, stable.  No effusions.  No acute osseous abnormality.  IMPRESSION: No acute disease.  Original Report Authenticated By: Gwynn Burly, M.D.     MDM  Doubt acute coronary syndrome given atypical symptoms and chronicity of pain. Doubt pulmonary embolism given therapeutic INR and patient asymptomatic throughout her ED course Plan followup Dr. Petra Kuba Diagnosis atypical chest pain        Doug Sou, MD 03/21/12 0001  Doug Sou, MD 03/21/12 0001

## 2012-03-27 ENCOUNTER — Ambulatory Visit: Payer: Medicare Other | Admitting: Internal Medicine

## 2012-04-07 ENCOUNTER — Ambulatory Visit (INDEPENDENT_AMBULATORY_CARE_PROVIDER_SITE_OTHER): Payer: Medicare Other | Admitting: *Deleted

## 2012-04-07 DIAGNOSIS — Z7901 Long term (current) use of anticoagulants: Secondary | ICD-10-CM

## 2012-04-07 DIAGNOSIS — I2699 Other pulmonary embolism without acute cor pulmonale: Secondary | ICD-10-CM

## 2012-04-23 ENCOUNTER — Encounter (INDEPENDENT_AMBULATORY_CARE_PROVIDER_SITE_OTHER): Payer: Self-pay | Admitting: Surgery

## 2012-04-23 ENCOUNTER — Encounter: Payer: Self-pay | Admitting: Cardiology

## 2012-04-23 ENCOUNTER — Ambulatory Visit (INDEPENDENT_AMBULATORY_CARE_PROVIDER_SITE_OTHER): Payer: Medicare Other | Admitting: Cardiology

## 2012-04-23 ENCOUNTER — Ambulatory Visit (INDEPENDENT_AMBULATORY_CARE_PROVIDER_SITE_OTHER): Payer: Medicare Other | Admitting: *Deleted

## 2012-04-23 VITALS — BP 99/62 | HR 83 | Ht 63.0 in | Wt 155.1 lb

## 2012-04-23 DIAGNOSIS — Z7901 Long term (current) use of anticoagulants: Secondary | ICD-10-CM

## 2012-04-23 DIAGNOSIS — I2510 Atherosclerotic heart disease of native coronary artery without angina pectoris: Secondary | ICD-10-CM

## 2012-04-23 DIAGNOSIS — I2699 Other pulmonary embolism without acute cor pulmonale: Secondary | ICD-10-CM

## 2012-04-23 DIAGNOSIS — F0390 Unspecified dementia without behavioral disturbance: Secondary | ICD-10-CM

## 2012-04-23 NOTE — Patient Instructions (Signed)
Your physician wants you to follow-up in: 6 MONTHS with Dr Stuckey.  You will receive a reminder letter in the mail two months in advance. If you don't receive a letter, please call our office to schedule the follow-up appointment.  Your physician recommends that you continue on your current medications as directed. Please refer to the Current Medication list given to you today.  

## 2012-04-27 NOTE — Assessment & Plan Note (Signed)
Remains stable at present.  Would not change treatment at present.

## 2012-04-27 NOTE — Assessment & Plan Note (Addendum)
Last cath without obstruction.  Recent visit to ER but symptoms seem to be gone.   No new recommendations.

## 2012-04-27 NOTE — Assessment & Plan Note (Signed)
Her mentation, according to my visits, does not seem to have changed.

## 2012-04-27 NOTE — Assessment & Plan Note (Signed)
Stable

## 2012-04-27 NOTE — Progress Notes (Signed)
HPI:  The patient is in for followup. She is generally stable. She was worried about her fibromyalgia. She was started on Lyrica by her primary care physician, Dr. Petra Kuba, and she felt it was too strong.  The dose was backed down and now she is better.  Denies new symptoms.  The patient had an unprovoked pulmonary embolus, presenting initially as chest pain with a negative And positive CT scan. As such we have been reluctant to stop her anticoagulation.  Since I last saw her, she has been seen in the EDP by Dr. Rennis Chris and they following findings:   Doubt acute coronary syndrome given atypical symptoms and chronicity of pain. Doubt pulmonary embolism given therapeutic INR and patient asymptomatic throughout her ED course  Plan followup Dr. Petra Kuba  Diagnosis atypical chest pain  Doug Sou, MD  03/21/12 0001    Current Outpatient Prescriptions  Medication Sig Dispense Refill  . atorvastatin (LIPITOR) 40 MG tablet Take by mouth daily.        . calcium-vitamin D (OSCAL WITH D) 500-200 MG-UNIT per tablet Take 1 tablet by mouth daily.        . Cholecalciferol (VITAMIN D PO) Take by mouth daily.      Marland Kitchen donepezil (ARICEPT) 5 MG tablet Take 5 mg by mouth at bedtime.      Marland Kitchen latanoprost (XALATAN) 0.005 % ophthalmic solution 1 drop at bedtime.      . polyethylene glycol powder (GLYCOLAX/MIRALAX) powder Take 17 gm in 6-8 ounces of liquid by mouth daily, may take up to twice a day if needed  255 g  0  . PROBIOTIC CAPS Take by mouth daily.        . psyllium (METAMUCIL) 58.6 % powder Take by mouth as needed.       . senna (SENOKOT) 8.6 MG tablet Take 1 tablet by mouth as needed.        . warfarin (COUMADIN) 5 MG tablet Take by mouth as directed.          No Known Allergies  Past Medical History  Diagnosis Date  . Other pulmonary embolism and infarction   . Pure hypercholesterolemia   . Overweight   . Depression   . Unspecified cardiovascular disease   . Iron deficiency anemia,  unspecified   . Hemorrhage of rectum and anus   . Leukocytopenia, unspecified   . Urinary hesitancy   . Diverticulosis of colon (without mention of hemorrhage)   . Coronary atherosclerosis   . Glaucoma   . Fibromyalgia   . UTI (lower urinary tract infection)   . History of uterine cancer   . Clotting disorder   . Thyroid disease   . Dementia   . Breast mass     left breast  . Leukopenia     chronic--benign  . Arthritis     Past Surgical History  Procedure Date  . Cataract extraction 2004    right  . Cardiac catheterization 07/25/10  . Abdominal hysterectomy   . Coronary angioplasty 1992  . Colonoscopy 07/01/2007    diverticulosis  . Upper gastrointestinal endoscopy 07/16/2007    tortous esophagus with spasms  . Thyroid needle aspiration 12/2007    right, hyperplastic nodule  . Kidney surgery     stent placed  . Neck surgery     Family History  Problem Relation Age of Onset  . Diabetes Mother     deceased age 19  . Pancreatic cancer Father     deceased age  70  . Emphysema Brother     deceased  . Alcohol abuse Brother   . Colon cancer Neg Hx   . Osteoporosis      History   Social History  . Marital Status: Divorced    Spouse Name: N/A    Number of Children: N/A  . Years of Education: N/A   Occupational History  .     Social History Main Topics  . Smoking status: Never Smoker   . Smokeless tobacco: Never Used  . Alcohol Use: No  . Drug Use: No  . Sexually Active: Not on file   Other Topics Concern  . Not on file   Social History Narrative  . No narrative on file    ROS: Please see the HPI.  All other systems reviewed and negative.  PHYSICAL EXAM:  BP 99/62  Pulse 83  Ht 5\' 3"  (1.6 m)  Wt 155 lb 1.9 oz (70.362 kg)  BMI 27.48 kg/m2  BP by me 120/70  General: Well developed, well nourished, in no acute distress. Head:  Normocephalic and atraumatic. Neck: no JVD Lungs: Clear to auscultation and percussion. Heart: Normal S1 and S2.   No murmur, rubs or gallops.  Pulses: Pulses normal in all 4 extremities. Extremities: No clubbing or cyanosis. No edema. Neurologic: Alert and oriented x 3.  EKG:  See 6/20 report.  No acute changes.  Minor T flattening.    CXR  Clinical Data: Chest pain. Leg swelling. Dizziness.  CHEST - 2 VIEW  Comparison: 07/24/2010  Findings: Heart size and vascularity are normal and the lungs are clear. Slight thoracic scoliosis, stable. No effusions. No acute osseous abnormality.  IMPRESSION: No acute disease.  Original Report Authenticated By: Gwynn Burly, M.D.       Last Resulted: 03/20/12 8:57 PM     ASSESSMENT AND PLAN:

## 2012-05-14 ENCOUNTER — Ambulatory Visit (INDEPENDENT_AMBULATORY_CARE_PROVIDER_SITE_OTHER): Payer: Medicare Other | Admitting: *Deleted

## 2012-05-14 DIAGNOSIS — Z7901 Long term (current) use of anticoagulants: Secondary | ICD-10-CM

## 2012-05-14 DIAGNOSIS — I2699 Other pulmonary embolism without acute cor pulmonale: Secondary | ICD-10-CM

## 2012-05-14 LAB — POCT INR: INR: 1.8

## 2012-05-20 ENCOUNTER — Other Ambulatory Visit: Payer: Self-pay | Admitting: Obstetrics & Gynecology

## 2012-05-20 DIAGNOSIS — Z1231 Encounter for screening mammogram for malignant neoplasm of breast: Secondary | ICD-10-CM

## 2012-06-06 ENCOUNTER — Other Ambulatory Visit: Payer: Self-pay | Admitting: Obstetrics & Gynecology

## 2012-06-06 ENCOUNTER — Ambulatory Visit
Admission: RE | Admit: 2012-06-06 | Discharge: 2012-06-06 | Disposition: A | Discharge status: Home / Self Care | Payer: Medicare Other | Source: Ambulatory Visit | Attending: Obstetrics & Gynecology | Admitting: Obstetrics & Gynecology

## 2012-06-06 DIAGNOSIS — N644 Mastodynia: Secondary | ICD-10-CM

## 2012-06-06 DIAGNOSIS — Z1231 Encounter for screening mammogram for malignant neoplasm of breast: Secondary | ICD-10-CM

## 2012-06-09 ENCOUNTER — Ambulatory Visit (INDEPENDENT_AMBULATORY_CARE_PROVIDER_SITE_OTHER): Payer: Medicare Other | Admitting: *Deleted

## 2012-06-09 DIAGNOSIS — I2699 Other pulmonary embolism without acute cor pulmonale: Secondary | ICD-10-CM

## 2012-06-09 DIAGNOSIS — Z7901 Long term (current) use of anticoagulants: Secondary | ICD-10-CM

## 2012-06-16 ENCOUNTER — Ambulatory Visit
Admission: RE | Admit: 2012-06-16 | Discharge: 2012-06-16 | Disposition: A | Discharge status: Home / Self Care | Payer: Medicare Other | Source: Ambulatory Visit | Attending: Obstetrics & Gynecology | Admitting: Obstetrics & Gynecology

## 2012-06-16 ENCOUNTER — Other Ambulatory Visit: Payer: Self-pay | Admitting: Obstetrics & Gynecology

## 2012-06-16 DIAGNOSIS — N644 Mastodynia: Secondary | ICD-10-CM

## 2012-06-30 ENCOUNTER — Ambulatory Visit (INDEPENDENT_AMBULATORY_CARE_PROVIDER_SITE_OTHER): Payer: Medicare Other

## 2012-06-30 DIAGNOSIS — Z7901 Long term (current) use of anticoagulants: Secondary | ICD-10-CM

## 2012-06-30 DIAGNOSIS — I2699 Other pulmonary embolism without acute cor pulmonale: Secondary | ICD-10-CM

## 2012-06-30 LAB — POCT INR: INR: 4.4

## 2012-07-14 ENCOUNTER — Ambulatory Visit (INDEPENDENT_AMBULATORY_CARE_PROVIDER_SITE_OTHER): Payer: Medicare Other | Admitting: *Deleted

## 2012-07-14 DIAGNOSIS — Z7901 Long term (current) use of anticoagulants: Secondary | ICD-10-CM

## 2012-07-14 DIAGNOSIS — I2699 Other pulmonary embolism without acute cor pulmonale: Secondary | ICD-10-CM

## 2012-07-14 LAB — POCT INR: INR: 1.9

## 2012-08-08 ENCOUNTER — Ambulatory Visit (INDEPENDENT_AMBULATORY_CARE_PROVIDER_SITE_OTHER): Payer: Medicare Other | Admitting: *Deleted

## 2012-08-08 DIAGNOSIS — Z7901 Long term (current) use of anticoagulants: Secondary | ICD-10-CM

## 2012-08-08 DIAGNOSIS — I2699 Other pulmonary embolism without acute cor pulmonale: Secondary | ICD-10-CM

## 2012-08-08 LAB — POCT INR: INR: 3.7

## 2012-08-22 ENCOUNTER — Ambulatory Visit (INDEPENDENT_AMBULATORY_CARE_PROVIDER_SITE_OTHER): Payer: Medicare Other | Admitting: *Deleted

## 2012-08-22 DIAGNOSIS — Z7901 Long term (current) use of anticoagulants: Secondary | ICD-10-CM

## 2012-08-22 DIAGNOSIS — I2699 Other pulmonary embolism without acute cor pulmonale: Secondary | ICD-10-CM

## 2012-09-03 ENCOUNTER — Encounter (HOSPITAL_COMMUNITY): Payer: Self-pay | Admitting: Emergency Medicine

## 2012-09-03 ENCOUNTER — Emergency Department (HOSPITAL_COMMUNITY)
Admission: EM | Admit: 2012-09-03 | Discharge: 2012-09-03 | Disposition: A | Discharge status: Home / Self Care | Payer: Medicare Other | Source: Home / Self Care | Attending: Emergency Medicine | Admitting: Emergency Medicine

## 2012-09-03 DIAGNOSIS — G629 Polyneuropathy, unspecified: Secondary | ICD-10-CM

## 2012-09-03 DIAGNOSIS — G589 Mononeuropathy, unspecified: Secondary | ICD-10-CM

## 2012-09-03 DIAGNOSIS — M129 Arthropathy, unspecified: Secondary | ICD-10-CM

## 2012-09-03 DIAGNOSIS — M1990 Unspecified osteoarthritis, unspecified site: Secondary | ICD-10-CM

## 2012-09-03 NOTE — ED Provider Notes (Signed)
History     CSN: [id number]  Arrival date & time 09/03/12  1425   First MD Initiated Contact with Patient 09/03/12 1537      Chief Complaint  Patient presents with  . body burning     (Consider location/radiation/quality/duration/timing/severity/associated sxs/prior treatment) HPI Patient c/o burning pain in multiple joints but primarily in her feet for many months.  Was previously prescribed Lyrica but reports she did not like the taste of the medicine.  States she has not tried any additional medications for pain.  Has appointment with pcp next week but would like something for joint pain. Denies change in extremity function.    Past Medical History  Diagnosis Date  . Other pulmonary embolism and infarction   . Pure hypercholesterolemia   . Overweight   . Depression   . Unspecified cardiovascular disease   . Iron deficiency anemia, unspecified   . Hemorrhage of rectum and anus   . Leukocytopenia, unspecified   . Urinary hesitancy   . Diverticulosis of colon (without mention of hemorrhage)   . Coronary atherosclerosis   . Glaucoma(365)   . Fibromyalgia   . UTI (lower urinary tract infection)   . History of uterine cancer   . Clotting disorder   . Thyroid disease   . Dementia   . Breast mass     left breast  . Leukopenia     chronic--benign  . Arthritis     Past Surgical History  Procedure Date  . Cataract extraction 2004    right  . Cardiac catheterization 07/25/10  . Abdominal hysterectomy   . Coronary angioplasty 1992  . Colonoscopy 07/01/2007    diverticulosis  . Upper gastrointestinal endoscopy 07/16/2007    tortous esophagus with spasms  . Thyroid needle aspiration 12/2007    right, hyperplastic nodule  . Kidney surgery     stent placed  . Neck surgery     Family History  Problem Relation Age of Onset  . Diabetes Mother     deceased age 45  . Pancreatic cancer Father     deceased age 68  . Emphysema Brother     deceased  . Alcohol abuse  Brother   . Colon cancer Neg Hx   . Osteoporosis      History  Substance Use Topics  . Smoking status: Never Smoker   . Smokeless tobacco: Never Used  . Alcohol Use: No    OB History    Grav Para Term Preterm Abortions TAB SAB Ect Mult Living                  Review of Systems  All other systems reviewed and are negative.    Allergies  Review of patient's allergies indicates no known allergies.  Home Medications   Current Outpatient Rx  Name  Route  Sig  Dispense  Refill  . ATORVASTATIN CALCIUM 40 MG PO TABS   Oral   Take by mouth daily.           Marland Kitchen CALCIUM CARBONATE-VITAMIN D 500-200 MG-UNIT PO TABS   Oral   Take 1 tablet by mouth daily.           Marland Kitchen VITAMIN D PO   Oral   Take by mouth daily.         . DONEPEZIL HCL 5 MG PO TABS   Oral   Take 5 mg by mouth at bedtime.         Marland Kitchen LATANOPROST 0.005 % OP  SOLN      1 drop at bedtime.         Marland Kitchen POLYETHYLENE GLYCOL 3350 PO POWD      Take 17 gm in 6-8 ounces of liquid by mouth daily, may take up to twice a day if needed   255 g   0   . PROBIOTIC PO CAPS   Oral   Take by mouth daily.           . WARFARIN SODIUM 5 MG PO TABS   Oral   Take by mouth as directed.           . PSYLLIUM 58.6 % PO POWD   Oral   Take by mouth as needed.          . SENNOSIDES 8.6 MG PO TABS   Oral   Take 1 tablet by mouth as needed.             BP 135/72  Pulse 74  Temp 98.1 F (36.7 C) (Oral)  Resp 17  SpO2 98%  Physical Exam  Nursing note and vitals reviewed. Constitutional: She is oriented to person, place, and time. Vital signs are normal. She appears well-developed and well-nourished. She is active and cooperative.  HENT:  Head: Normocephalic.  Mouth/Throat: Oropharynx is clear and moist. No oropharyngeal exudate.  Eyes: Conjunctivae normal are normal. Pupils are equal, round, and reactive to light. No scleral icterus.  Neck: Trachea normal and normal range of motion. Neck supple. No  thyromegaly present.  Cardiovascular: Normal rate, regular rhythm, normal heart sounds and intact distal pulses.   No murmur heard. Pulmonary/Chest: Effort normal and breath sounds normal.  Musculoskeletal: Normal range of motion.       Right ankle: Normal.       Left ankle: Normal.       Right foot: Normal.       Left foot: Normal.  Lymphadenopathy:    She has no cervical adenopathy.  Neurological: She is alert and oriented to person, place, and time. She has normal strength. No cranial nerve deficit or sensory deficit. Coordination and gait normal. GCS eye subscore is 4. GCS verbal subscore is 5. GCS motor subscore is 6.       Sensation intact to distal extremities.    Skin: Skin is warm, dry and intact. No abrasion, no bruising, no lesion and no rash noted.  Psychiatric: She has a normal mood and affect. Her speech is normal and behavior is normal. Judgment and thought content normal. Cognition and memory are normal.    ED Course  Procedures (including critical care time)  Labs Reviewed - No data to display No results found.   1. Neuropathy   2. Arthritis       MDM  Pt currently on coumadin thus she is timid about trying new medications. Advised to begin tylenol arthritis until she meets with her pcp next week.  Offered rx for ultram, pt declined related to pain in joint is manageable.        Johnsie Kindred, NP 09/16/12 [phone]

## 2012-09-03 NOTE — ED Notes (Signed)
Reports body is burning for about a month now.  The feeling comes and goes.  Patient says the burning is mostly in feet, shoulders and across chest.

## 2012-09-05 ENCOUNTER — Encounter (INDEPENDENT_AMBULATORY_CARE_PROVIDER_SITE_OTHER): Payer: Medicare Other

## 2012-09-10 ENCOUNTER — Ambulatory Visit (INDEPENDENT_AMBULATORY_CARE_PROVIDER_SITE_OTHER): Payer: Medicare Other | Admitting: *Deleted

## 2012-09-10 DIAGNOSIS — I2699 Other pulmonary embolism without acute cor pulmonale: Secondary | ICD-10-CM

## 2012-09-10 DIAGNOSIS — Z7901 Long term (current) use of anticoagulants: Secondary | ICD-10-CM

## 2012-09-16 NOTE — ED Provider Notes (Signed)
Medical screening examination/treatment/procedure(s) were performed by non-physician practitioner and as supervising physician I was immediately available for consultation/collaboration.  Leslee Home, M.D.   Reuben Likes, MD 09/16/12 [phone]

## 2012-09-29 ENCOUNTER — Ambulatory Visit (INDEPENDENT_AMBULATORY_CARE_PROVIDER_SITE_OTHER): Payer: Medicare Other | Admitting: Pharmacist

## 2012-09-29 DIAGNOSIS — I2699 Other pulmonary embolism without acute cor pulmonale: Secondary | ICD-10-CM

## 2012-09-29 DIAGNOSIS — Z7901 Long term (current) use of anticoagulants: Secondary | ICD-10-CM

## 2012-09-29 LAB — POCT INR: INR: 3.1

## 2012-10-20 ENCOUNTER — Ambulatory Visit (INDEPENDENT_AMBULATORY_CARE_PROVIDER_SITE_OTHER): Payer: Medicare Other | Admitting: *Deleted

## 2012-10-20 DIAGNOSIS — Z7901 Long term (current) use of anticoagulants: Secondary | ICD-10-CM

## 2012-10-20 DIAGNOSIS — I2699 Other pulmonary embolism without acute cor pulmonale: Secondary | ICD-10-CM

## 2012-11-10 ENCOUNTER — Ambulatory Visit (INDEPENDENT_AMBULATORY_CARE_PROVIDER_SITE_OTHER): Payer: Medicare Other | Admitting: *Deleted

## 2012-11-10 DIAGNOSIS — Z7901 Long term (current) use of anticoagulants: Secondary | ICD-10-CM

## 2012-11-10 DIAGNOSIS — I2699 Other pulmonary embolism without acute cor pulmonale: Secondary | ICD-10-CM

## 2012-11-14 ENCOUNTER — Ambulatory Visit: Payer: Medicare Other | Admitting: Cardiology

## 2012-11-17 ENCOUNTER — Other Ambulatory Visit (HOSPITAL_COMMUNITY): Payer: Self-pay | Admitting: Pulmonary Disease

## 2012-11-17 DIAGNOSIS — R109 Unspecified abdominal pain: Secondary | ICD-10-CM

## 2012-11-17 DIAGNOSIS — R1031 Right lower quadrant pain: Secondary | ICD-10-CM

## 2012-11-21 ENCOUNTER — Ambulatory Visit (HOSPITAL_COMMUNITY)
Admission: RE | Admit: 2012-11-21 | Discharge: 2012-11-21 | Disposition: A | Discharge status: Home / Self Care | Payer: Medicare Other | Source: Ambulatory Visit | Attending: Pulmonary Disease | Admitting: Pulmonary Disease

## 2012-11-21 DIAGNOSIS — R1031 Right lower quadrant pain: Secondary | ICD-10-CM | POA: Insufficient documentation

## 2012-11-21 DIAGNOSIS — R109 Unspecified abdominal pain: Secondary | ICD-10-CM

## 2012-11-21 DIAGNOSIS — N83209 Unspecified ovarian cyst, unspecified side: Secondary | ICD-10-CM | POA: Insufficient documentation

## 2012-11-21 MED ORDER — IOHEXOL 300 MG/ML  SOLN
80.0000 mL | Freq: Once | INTRAMUSCULAR | Status: AC | PRN
  Administered 2012-11-21: 80 mL via INTRAVENOUS

## 2012-12-01 ENCOUNTER — Ambulatory Visit (INDEPENDENT_AMBULATORY_CARE_PROVIDER_SITE_OTHER): Payer: Medicare Other

## 2012-12-01 DIAGNOSIS — I2699 Other pulmonary embolism without acute cor pulmonale: Secondary | ICD-10-CM

## 2012-12-01 DIAGNOSIS — Z7901 Long term (current) use of anticoagulants: Secondary | ICD-10-CM

## 2012-12-01 LAB — POCT INR: INR: 4

## 2012-12-02 ENCOUNTER — Ambulatory Visit (INDEPENDENT_AMBULATORY_CARE_PROVIDER_SITE_OTHER): Payer: Medicare Other | Admitting: Cardiology

## 2012-12-02 ENCOUNTER — Encounter: Payer: Self-pay | Admitting: Cardiology

## 2012-12-02 VITALS — BP 100/60 | HR 81 | Ht 66.0 in | Wt 154.0 lb

## 2012-12-02 DIAGNOSIS — Z87448 Personal history of other diseases of urinary system: Secondary | ICD-10-CM

## 2012-12-02 DIAGNOSIS — I2510 Atherosclerotic heart disease of native coronary artery without angina pectoris: Secondary | ICD-10-CM

## 2012-12-02 DIAGNOSIS — I2699 Other pulmonary embolism without acute cor pulmonale: Secondary | ICD-10-CM

## 2012-12-02 LAB — BASIC METABOLIC PANEL
Calcium: 9.2 mg/dL (ref 8.4–10.5)
GFR: 73.33 mL/min (ref >60.00)
Glucose, Bld: 104 mg/dL — ABNORMAL HIGH (ref 70–99)
Potassium: 3.6 mEq/L (ref 3.5–5.1)
Sodium: 141 mEq/L (ref 135–145)

## 2012-12-02 LAB — CBC WITH DIFFERENTIAL/PLATELET
Basophils Absolute: 0 10*3/uL (ref 0.0–0.1)
Eosinophils Absolute: 0.1 10*3/uL (ref 0.0–0.7)
Eosinophils Relative: 1.7 % (ref 0.0–5.0)
MCV: 93.8 fl (ref 78.0–100.0)
Monocytes Absolute: 0.3 10*3/uL (ref 0.1–1.0)
Neutrophils Relative %: 56.2 % (ref 43.0–77.0)
Platelets: 212 10*3/uL (ref 150.0–400.0)
RDW: 13.7 % (ref 11.5–14.6)
WBC: 4.3 10*3/uL — ABNORMAL LOW (ref 4.5–10.5)

## 2012-12-02 LAB — URINALYSIS, ROUTINE W REFLEX MICROSCOPIC
Leukocytes, UA: NEGATIVE
Nitrite: NEGATIVE
Specific Gravity, Urine: >=1.03 (ref 1.000–1.030)
Urobilinogen, UA: 0.2 (ref 0.0–1.0)
pH: 5.5 (ref 5.0–8.0)

## 2012-12-02 NOTE — Progress Notes (Signed)
HPI:  This patient is in for a followup visit. From a cardiac standpoint she seems to be remaining stable. She's not having chest pain or shortness of breath. She does note her urine seems to be a little dark, and she is scheduling an appointment to see her urologist. She denies any other major symptoms. I asked about her ability to keep her medications straight, and she thought that she was pretty good with this.  She also notes a burning in the r arm at times which goes down.  Lyrica helped this a bit, but has not completely taken it away.  Stirring about seems to make her arm better.    Current Outpatient Prescriptions  Medication Sig Dispense Refill  . atorvastatin (LIPITOR) 40 MG tablet Take by mouth daily.        . calcium-vitamin D (OSCAL WITH D) 500-200 MG-UNIT per tablet Take 1 tablet by mouth daily.        Marland Kitchen donepezil (ARICEPT) 5 MG tablet Take 5 mg by mouth at bedtime.      Marland Kitchen latanoprost (XALATAN) 0.005 % ophthalmic solution 1 drop at bedtime.      Marland Kitchen LYRICA 25 MG capsule Take 1 tablet daily      . polyethylene glycol powder (GLYCOLAX/MIRALAX) powder Take 17 gm in 6-8 ounces of liquid by mouth daily, may take up to twice a day if needed  255 g  0  . PROBIOTIC CAPS Take by mouth daily.        . psyllium (METAMUCIL) 58.6 % powder Take by mouth as needed.       . senna (SENOKOT) 8.6 MG tablet Take 1 tablet by mouth as needed.        . warfarin (COUMADIN) 5 MG tablet Take by mouth as directed.        . zolpidem (AMBIEN) 10 MG tablet Take half a tablet as needed       No current facility-administered medications for this visit.    No Known Allergies  Past Medical History  Diagnosis Date  . Other pulmonary embolism and infarction   . Pure hypercholesterolemia   . Overweight   . Depression   . Unspecified cardiovascular disease   . Iron deficiency anemia, unspecified   . Hemorrhage of rectum and anus   . Leukocytopenia, unspecified   . Urinary hesitancy   . Diverticulosis of  colon (without mention of hemorrhage)   . Coronary atherosclerosis   . Glaucoma(365)   . Fibromyalgia   . UTI (lower urinary tract infection)   . History of uterine cancer   . Clotting disorder   . Thyroid disease   . Dementia   . Breast mass     left breast  . Leukopenia     chronic--benign  . Arthritis     Past Surgical History  Procedure Laterality Date  . Cataract extraction  2004    right  . Cardiac catheterization  07/25/10  . Abdominal hysterectomy    . Coronary angioplasty  1992  . Colonoscopy  07/01/2007    diverticulosis  . Upper gastrointestinal endoscopy  07/16/2007    tortous esophagus with spasms  . Thyroid needle aspiration  12/2007    right, hyperplastic nodule  . Kidney surgery      stent placed  . Neck surgery      Family History  Problem Relation Age of Onset  . Diabetes Mother     deceased age 63  . Pancreatic cancer Father  deceased age 61  . Emphysema Brother     deceased  . Alcohol abuse Brother   . Colon cancer Neg Hx   . Osteoporosis      History   Social History  . Marital Status: Divorced    Spouse Name: N/A    Number of Children: N/A  . Years of Education: N/A   Occupational History  .     Social History Main Topics  . Smoking status: Never Smoker   . Smokeless tobacco: Never Used  . Alcohol Use: No  . Drug Use: No  . Sexually Active: Not on file   Other Topics Concern  . Not on file   Social History Narrative  . No narrative on file    ROS: Please see the HPI.  All other systems reviewed and negative.  PHYSICAL EXAM:  BP 100/60  Pulse 81  Ht 5\' 6"  (1.676 m)  Wt 154 lb (69.854 kg)  BMI 24.87 kg/m2  SpO2 98%  General: Well developed, well nourished, in no acute distress. Head:  Normocephalic and atraumatic. Neck: no JVD Lungs: Clear to auscultation and percussion. Heart: Normal S1 and S2.  No murmur, rubs or gallops.  Pulses: Pulses normal in all 4 extremities. Extremities: No clubbing or cyanosis.  No edema. Neurologic: Alert and oriented x 3.  EKG:  NSR.  Nonspecific T flattening.    ASSESSMENT AND PLAN:

## 2012-12-02 NOTE — Patient Instructions (Addendum)
Your physician recommends that you return for lab work in: today (cbc, ua, bmet)  Your physician recommends that you schedule a follow-up appointment in: 2 months with Dr Shirlee Latch

## 2012-12-02 NOTE — Assessment & Plan Note (Signed)
Continues to remain stable from this standpoint.  No further symptoms.

## 2012-12-02 NOTE — Assessment & Plan Note (Signed)
History of hematuria.  Will recheck since she is on warfarin.

## 2012-12-02 NOTE — Assessment & Plan Note (Signed)
She has been on chronic warfarin as we do not know what precipitated her PE, so we have continued.  I will arrange early fu in our office as we have been the primary follow up for this.  Will also check UA and CBC given her urinary symptoms.

## 2012-12-16 ENCOUNTER — Ambulatory Visit (INDEPENDENT_AMBULATORY_CARE_PROVIDER_SITE_OTHER): Payer: Medicare Other | Admitting: *Deleted

## 2012-12-16 DIAGNOSIS — Z7901 Long term (current) use of anticoagulants: Secondary | ICD-10-CM

## 2012-12-16 DIAGNOSIS — I2699 Other pulmonary embolism without acute cor pulmonale: Secondary | ICD-10-CM

## 2012-12-30 ENCOUNTER — Ambulatory Visit (INDEPENDENT_AMBULATORY_CARE_PROVIDER_SITE_OTHER): Payer: Medicare Other | Admitting: *Deleted

## 2012-12-30 DIAGNOSIS — I2699 Other pulmonary embolism without acute cor pulmonale: Secondary | ICD-10-CM

## 2012-12-30 DIAGNOSIS — Z7901 Long term (current) use of anticoagulants: Secondary | ICD-10-CM

## 2013-01-08 ENCOUNTER — Ambulatory Visit (INDEPENDENT_AMBULATORY_CARE_PROVIDER_SITE_OTHER): Payer: Medicare Other | Admitting: *Deleted

## 2013-01-08 DIAGNOSIS — I2699 Other pulmonary embolism without acute cor pulmonale: Secondary | ICD-10-CM

## 2013-01-08 DIAGNOSIS — Z7901 Long term (current) use of anticoagulants: Secondary | ICD-10-CM

## 2013-01-08 MED ORDER — WARFARIN SODIUM 5 MG PO TABS: ORAL_TABLET | ORAL | Status: DC

## 2013-01-21 ENCOUNTER — Ambulatory Visit (INDEPENDENT_AMBULATORY_CARE_PROVIDER_SITE_OTHER): Payer: Medicare Other | Admitting: *Deleted

## 2013-01-21 DIAGNOSIS — Z7901 Long term (current) use of anticoagulants: Secondary | ICD-10-CM

## 2013-01-21 DIAGNOSIS — I2699 Other pulmonary embolism without acute cor pulmonale: Secondary | ICD-10-CM

## 2013-02-03 ENCOUNTER — Ambulatory Visit (INDEPENDENT_AMBULATORY_CARE_PROVIDER_SITE_OTHER): Payer: Medicare Other | Admitting: Pharmacist

## 2013-02-03 DIAGNOSIS — Z7901 Long term (current) use of anticoagulants: Secondary | ICD-10-CM

## 2013-02-03 DIAGNOSIS — I2699 Other pulmonary embolism without acute cor pulmonale: Secondary | ICD-10-CM

## 2013-02-03 LAB — POCT INR: INR: 3.4

## 2013-02-09 ENCOUNTER — Ambulatory Visit (INDEPENDENT_AMBULATORY_CARE_PROVIDER_SITE_OTHER): Payer: Medicare Other | Admitting: Cardiology

## 2013-02-09 ENCOUNTER — Ambulatory Visit: Payer: Medicare Other | Admitting: Cardiology

## 2013-02-09 ENCOUNTER — Encounter: Payer: Self-pay | Admitting: Cardiology

## 2013-02-09 ENCOUNTER — Ambulatory Visit (INDEPENDENT_AMBULATORY_CARE_PROVIDER_SITE_OTHER): Payer: Medicare Other

## 2013-02-09 VITALS — BP 110/58 | HR 74 | Ht 66.0 in | Wt 153.0 lb

## 2013-02-09 DIAGNOSIS — E7800 Pure hypercholesterolemia, unspecified: Secondary | ICD-10-CM

## 2013-02-09 DIAGNOSIS — I2510 Atherosclerotic heart disease of native coronary artery without angina pectoris: Secondary | ICD-10-CM

## 2013-02-09 DIAGNOSIS — I2699 Other pulmonary embolism without acute cor pulmonale: Secondary | ICD-10-CM

## 2013-02-09 DIAGNOSIS — Z7901 Long term (current) use of anticoagulants: Secondary | ICD-10-CM

## 2013-02-09 LAB — POCT INR: INR: 2.6

## 2013-02-09 NOTE — Patient Instructions (Addendum)
**Note De-Identified  Obfuscation** Your physician recommends that you continue on your current medications as directed. Please refer to the Current Medication list given to you today.  Your physician recommends that you return for lab work in: This week  Your physician wants you to follow-up in: 6 months with a PA and in 1 year with Dr. Shirlee Latch. You will receive a reminder letter in the mail two months in advance. If you don't receive a letter, please call our office to schedule the follow-up appointment.

## 2013-02-10 NOTE — Progress Notes (Signed)
Patient ID: Erin Roberson, female   DOB: 1924/08/20, 79 y.o.   MRN: [id number] PCP: Dr. Petra Kuba  79 yo with history of CAD and idiopathic PE presents for cardiology followup.  She is on chronic coumadin.  Last cath in 2011 showed nonobstructive CAD.  She has been seen by Dr. Riley Kill in the past and is seen by me for the first time today.  I reviewed all her old records.  She has been doing well overall.  Main complaint is a "streak" of chest pain that she will get on occasion.  It feels like a "tiny scratch" and lasts for about 1 second, then is gone.  It is not exertional.  She has an episode every week or so.  No DOE.  Main exercise is gardening.    Labs (3/14): K 3.6, creatinine 0.9, HCT 40.1  PMH: 1. History of PE: Idiopathic, on chronic coumadin because no trigger for PE identified . 2. Hyperlipidemia 3. Diverticulosis 4. H/o UTI 5. H/o uterine cancer 6. Dementia 7. CAD: PTCA LAD 1992.  Last cath in 10/11 with nonobstructive disease, EF 60% by LV-gram.   SH: Divorced, lives alone in [place], no children, nonsmoker.   FH: Father with pancreatic cancer.   ROS: All systems reviewed and negative except as per HPI  Current Outpatient Prescriptions  Medication Sig Dispense Refill  . atorvastatin (LIPITOR) 40 MG tablet Take by mouth daily.        . calcium-vitamin D (OSCAL WITH D) 500-200 MG-UNIT per tablet Take 1 tablet by mouth as needed.       . donepezil (ARICEPT) 5 MG tablet Take 5 mg by mouth as needed.       . latanoprost (XALATAN) 0.005 % ophthalmic solution 1 drop at bedtime.      Marland Kitchen LYRICA 25 MG capsule Take 1 tablet daily      . polyethylene glycol powder (GLYCOLAX/MIRALAX) powder Take 17 gm in 6-8 ounces of liquid by mouth daily, may take up to twice a day if needed  255 g  0  . PROBIOTIC CAPS Take by mouth as needed.       . psyllium (METAMUCIL) 58.6 % powder Take by mouth as needed.       . senna (SENOKOT) 8.6 MG tablet Take 1 tablet by mouth as needed.        .  warfarin (COUMADIN) 5 MG tablet Take as directed by coumadin clinic  40 tablet  3  . zolpidem (AMBIEN) 10 MG tablet Take half a tablet as needed       No current facility-administered medications for this visit.    BP 110/58  Pulse 74  Ht 5\' 6"  (1.676 m)  Wt 153 lb (69.4 kg)  BMI 24.71 kg/m2  SpO2 98% General: NAD Neck: No JVD, no thyromegaly or thyroid nodule.  Lungs: Clear to auscultation bilaterally with normal respiratory effort. CV: Nondisplaced PMI.  Heart regular S1/S2, no S3/S4, no murmur.  No peripheral edema.  No carotid bruit.  Normal pedal pulses.  Abdomen: Soft, nontender, no hepatosplenomegaly, no distention.  Neurologic: Alert and oriented x 3.  Psych: Normal affect. Extremities: No clubbing or cyanosis.   Assessment/Plan:  1. PE: No trigger for PE.  She is on chronic coumadin with no problems.  2. CAD: Stable.  She has atypical chest pain that is probably noncardiac.  She is not on ASA due to stable CAD with coumadin use.  She is on a statin.  3. Hyperlipidemia: Check lipids  today.   Followup 6 months with PA, 1 year with me.   Marca Ancona 02/10/2013 9:02 AM

## 2013-02-13 ENCOUNTER — Other Ambulatory Visit (INDEPENDENT_AMBULATORY_CARE_PROVIDER_SITE_OTHER): Payer: Medicare Other

## 2013-02-13 DIAGNOSIS — E7800 Pure hypercholesterolemia, unspecified: Secondary | ICD-10-CM

## 2013-02-13 LAB — LIPID PANEL
Cholesterol: 218 mg/dL — ABNORMAL HIGH (ref 0–200)
HDL: 57.1 mg/dL (ref >39.00)
VLDL: 15.2 mg/dL (ref 0.0–40.0)

## 2013-02-27 ENCOUNTER — Ambulatory Visit (INDEPENDENT_AMBULATORY_CARE_PROVIDER_SITE_OTHER): Payer: Medicare Other

## 2013-02-27 ENCOUNTER — Encounter: Payer: Self-pay | Admitting: *Deleted

## 2013-02-27 DIAGNOSIS — I2699 Other pulmonary embolism without acute cor pulmonale: Secondary | ICD-10-CM

## 2013-02-27 DIAGNOSIS — Z7901 Long term (current) use of anticoagulants: Secondary | ICD-10-CM

## 2013-02-27 LAB — POCT INR: INR: 2.5

## 2013-03-09 ENCOUNTER — Telehealth: Payer: Self-pay | Admitting: Cardiology

## 2013-03-09 DIAGNOSIS — E7800 Pure hypercholesterolemia, unspecified: Secondary | ICD-10-CM

## 2013-03-09 MED ORDER — ATORVASTATIN CALCIUM 80 MG PO TABS: 80.0000 mg | ORAL_TABLET | Freq: Every day | ORAL | Status: DC

## 2013-03-09 NOTE — Telephone Encounter (Signed)
New Problem:    Patient called in because Erin Roberson sent her a letter to call back to discuss her latest lab results.  Please call back.

## 2013-03-09 NOTE — Telephone Encounter (Signed)
Spoke with patient who received Margorie John, RN's letter regarding her need to call us.  I advised patient that Dr. Shirlee Latch wants her to increase her Atorvastatin dose to 80 mg per day due to her elevated cholesterol level.  Patient states she only has 1 of her 40 mg pills left.  I sent Rx for Atorvastatin 80 mg QD to [facility] per patient's request. I advised patient that she is to return for f/u labs and patient scheduled this for 7/16.  I made patient's appointment, ordered labs, and advised patient to come in fasting for this lab work.

## 2013-03-27 ENCOUNTER — Ambulatory Visit (INDEPENDENT_AMBULATORY_CARE_PROVIDER_SITE_OTHER): Payer: Medicare Other

## 2013-03-27 DIAGNOSIS — Z7901 Long term (current) use of anticoagulants: Secondary | ICD-10-CM

## 2013-03-27 DIAGNOSIS — I2699 Other pulmonary embolism without acute cor pulmonale: Secondary | ICD-10-CM

## 2013-04-15 ENCOUNTER — Other Ambulatory Visit: Payer: Self-pay | Admitting: *Deleted

## 2013-04-15 ENCOUNTER — Other Ambulatory Visit (INDEPENDENT_AMBULATORY_CARE_PROVIDER_SITE_OTHER): Payer: Medicare Other

## 2013-04-15 DIAGNOSIS — E7800 Pure hypercholesterolemia, unspecified: Secondary | ICD-10-CM

## 2013-04-15 DIAGNOSIS — I2510 Atherosclerotic heart disease of native coronary artery without angina pectoris: Secondary | ICD-10-CM

## 2013-04-15 LAB — LIPID PANEL
HDL: 58.8 mg/dL (ref >39.00)
Total CHOL/HDL Ratio: 3
Triglycerides: 93 mg/dL (ref 0.0–149.0)
VLDL: 18.6 mg/dL (ref 0.0–40.0)

## 2013-04-15 LAB — HEPATIC FUNCTION PANEL
ALT: 13 U/L (ref 0–35)
AST: 20 U/L (ref 0–37)
Albumin: 3.4 g/dL — ABNORMAL LOW (ref 3.5–5.2)
Total Bilirubin: 0.5 mg/dL (ref 0.3–1.2)

## 2013-05-11 ENCOUNTER — Ambulatory Visit (INDEPENDENT_AMBULATORY_CARE_PROVIDER_SITE_OTHER): Payer: Medicare Other | Admitting: *Deleted

## 2013-05-11 DIAGNOSIS — Z7901 Long term (current) use of anticoagulants: Secondary | ICD-10-CM

## 2013-05-11 DIAGNOSIS — I2699 Other pulmonary embolism without acute cor pulmonale: Secondary | ICD-10-CM

## 2013-05-11 LAB — POCT INR: INR: 2.2

## 2013-05-31 ENCOUNTER — Emergency Department (INDEPENDENT_AMBULATORY_CARE_PROVIDER_SITE_OTHER)
Admission: EM | Admit: 2013-05-31 | Discharge: 2013-05-31 | Disposition: A | Discharge status: Home / Self Care | Payer: Medicare (Managed Care) | Source: Home / Self Care | Attending: Emergency Medicine | Admitting: Emergency Medicine

## 2013-05-31 ENCOUNTER — Encounter (HOSPITAL_COMMUNITY): Payer: Self-pay | Admitting: Emergency Medicine

## 2013-05-31 DIAGNOSIS — G629 Polyneuropathy, unspecified: Secondary | ICD-10-CM

## 2013-05-31 DIAGNOSIS — G589 Mononeuropathy, unspecified: Secondary | ICD-10-CM

## 2013-05-31 MED ORDER — TRAMADOL HCL 50 MG PO TABS: ORAL_TABLET | ORAL | Status: DC

## 2013-05-31 NOTE — ED Provider Notes (Signed)
CSN: [id number]     Arrival date & time 05/31/13  1345 History   First MD Initiated Contact with Patient 05/31/13 1452     Chief Complaint  Patient presents with  . Leg Pain   (Consider location/radiation/quality/duration/timing/severity/associated sxs/prior Treatment) HPI Comments: 79 year old female presents for evaluation of  burning sensation in her bilateral legs. This is been going on since last year sometime that has gotten worse in the past 2 months. She only has this burning sensation at night no discomfort at all during the daytime. No swelling in her legs, numbness, or recent injury   Past Medical History  Diagnosis Date  . Other pulmonary embolism and infarction   . Pure hypercholesterolemia   . Overweight(278.02)   . Depression   . Unspecified cardiovascular disease   . Iron deficiency anemia, unspecified   . Hemorrhage of rectum and anus   . Leukocytopenia, unspecified   . Urinary hesitancy   . Diverticulosis of colon (without mention of hemorrhage)   . Coronary atherosclerosis   . Glaucoma   . Fibromyalgia   . UTI (lower urinary tract infection)   . History of uterine cancer   . Clotting disorder   . Thyroid disease   . Dementia   . Breast mass     left breast  . Leukopenia     chronic--benign  . Arthritis    Past Surgical History  Procedure Laterality Date  . Cataract extraction  2004    right  . Cardiac catheterization  07/25/10  . Abdominal hysterectomy    . Coronary angioplasty  1992  . Colonoscopy  07/01/2007    diverticulosis  . Upper gastrointestinal endoscopy  07/16/2007    tortous esophagus with spasms  . Thyroid needle aspiration  12/2007    right, hyperplastic nodule  . Kidney surgery      stent placed  . Neck surgery     Family History  Problem Relation Age of Onset  . Diabetes Mother     deceased age 42  . Pancreatic cancer Father     deceased age 61  . Emphysema Brother     deceased  . Alcohol abuse Brother   . Colon cancer  Neg Hx   . Osteoporosis     History  Substance Use Topics  . Smoking status: Never Smoker   . Smokeless tobacco: Never Used  . Alcohol Use: No   OB History   Grav Para Term Preterm Abortions TAB SAB Ect Mult Living                 Review of Systems  Constitutional: Negative for fever and chills.  Eyes: Negative for visual disturbance.  Respiratory: Negative for cough and shortness of breath.   Cardiovascular: Negative for chest pain, palpitations and leg swelling.  Gastrointestinal: Negative for nausea, vomiting and abdominal pain.  Endocrine: Negative for polydipsia and polyuria.  Genitourinary: Negative for dysuria, urgency and frequency.  Musculoskeletal: Negative for myalgias and arthralgias.       Leg pain and burning   Skin: Negative for rash.  Neurological: Negative for dizziness, weakness and light-headedness.    Allergies  Review of patient's allergies indicates no known allergies.  Home Medications   Current Outpatient Rx  Name  Route  Sig  Dispense  Refill  . atorvastatin (LIPITOR) 80 MG tablet      1/2 tablet (total 40mg ) daily         . calcium-vitamin D (OSCAL WITH D) 500-200 MG-UNIT per tablet  Oral   Take 1 tablet by mouth as needed.          . donepezil (ARICEPT) 5 MG tablet   Oral   Take 5 mg by mouth as needed.          . latanoprost (XALATAN) 0.005 % ophthalmic solution      1 drop at bedtime.         Marland Kitchen LYRICA 25 MG capsule      Take 1 tablet daily         . polyethylene glycol powder (GLYCOLAX/MIRALAX) powder      Take 17 gm in 6-8 ounces of liquid by mouth daily, may take up to twice a day if needed   255 g   0   . PROBIOTIC CAPS   Oral   Take by mouth as needed.          . psyllium (METAMUCIL) 58.6 % powder   Oral   Take by mouth as needed.          . senna (SENOKOT) 8.6 MG tablet   Oral   Take 1 tablet by mouth as needed.           . traMADol (ULTRAM) 50 MG tablet      1-2 tablets by mouth each  bedtime when necessary   30 tablet   0   . warfarin (COUMADIN) 5 MG tablet      Take as directed by coumadin clinic   40 tablet   3   . zolpidem (AMBIEN) 10 MG tablet      Take half a tablet as needed          BP 106/59  Pulse 98  Temp(Src) 98.2 F (36.8 C) (Oral)  Resp 18  SpO2 99% Physical Exam  Nursing note and vitals reviewed. Constitutional: She is oriented to person, place, and time. She appears well-developed and well-nourished. No distress.  HENT:  Head: Normocephalic and atraumatic.  Pulmonary/Chest: Effort normal.  Neurological: She is alert and oriented to person, place, and time. She has normal reflexes. She displays normal reflexes. No cranial nerve deficit. She exhibits normal muscle tone. Coordination normal.  Skin: Skin is warm and dry. No rash noted. She is not diaphoretic.  Psychiatric: She has a normal mood and affect. Judgment normal.    ED Course  Procedures (including critical care time) Labs Review Labs Reviewed - No data to display Imaging Review No results found.  MDM   1. Neuropathy    Patient has a previous diagnosis of neuropathy. Her history and physical are consistent with neuropathy. She does not currently have a primary care physician, have given her information on obtaining one. All questions answered. Followup if not improving or worsening   Meds ordered this encounter  Medications  . traMADol (ULTRAM) 50 MG tablet    Sig: 1-2 tablets by mouth each bedtime when necessary    Dispense:  30 tablet    Refill:  0       Graylon Good, PA-C 05/31/13 1512

## 2013-05-31 NOTE — ED Provider Notes (Signed)
Medical screening examination/treatment/procedure(s) were performed by non-physician practitioner and as supervising physician I was immediately available for consultation/collaboration.  Leslee Home, M.D.  Reuben Likes, MD 05/31/13 [phone]

## 2013-05-31 NOTE — ED Notes (Signed)
C/o bilateral leg burning for two months now.   Patient states the burning is mostly at night.

## 2013-06-16 ENCOUNTER — Other Ambulatory Visit (INDEPENDENT_AMBULATORY_CARE_PROVIDER_SITE_OTHER): Payer: Medicare (Managed Care)

## 2013-06-16 ENCOUNTER — Ambulatory Visit (INDEPENDENT_AMBULATORY_CARE_PROVIDER_SITE_OTHER): Payer: Medicare (Managed Care) | Admitting: Pharmacist

## 2013-06-16 DIAGNOSIS — I2699 Other pulmonary embolism without acute cor pulmonale: Secondary | ICD-10-CM

## 2013-06-16 DIAGNOSIS — Z7901 Long term (current) use of anticoagulants: Secondary | ICD-10-CM

## 2013-06-16 DIAGNOSIS — I2510 Atherosclerotic heart disease of native coronary artery without angina pectoris: Secondary | ICD-10-CM

## 2013-06-16 DIAGNOSIS — E7800 Pure hypercholesterolemia, unspecified: Secondary | ICD-10-CM

## 2013-06-16 LAB — POCT INR: INR: 2.2

## 2013-06-16 LAB — HEPATIC FUNCTION PANEL
ALT: 12 U/L (ref 0–35)
AST: 23 U/L (ref 0–37)
Alkaline Phosphatase: 69 U/L (ref 39–117)
Total Bilirubin: 0.4 mg/dL (ref 0.3–1.2)

## 2013-06-16 LAB — LIPID PANEL
HDL: 57.6 mg/dL (ref >39.00)
Total CHOL/HDL Ratio: 3
Triglycerides: 91 mg/dL (ref 0.0–149.0)

## 2013-07-12 ENCOUNTER — Other Ambulatory Visit: Payer: Self-pay | Admitting: Cardiology

## 2013-07-28 ENCOUNTER — Ambulatory Visit (INDEPENDENT_AMBULATORY_CARE_PROVIDER_SITE_OTHER): Payer: Medicare Other | Admitting: *Deleted

## 2013-07-28 DIAGNOSIS — Z7901 Long term (current) use of anticoagulants: Secondary | ICD-10-CM

## 2013-07-28 DIAGNOSIS — I2699 Other pulmonary embolism without acute cor pulmonale: Secondary | ICD-10-CM

## 2013-08-10 ENCOUNTER — Telehealth: Payer: Self-pay | Admitting: Cardiology

## 2013-08-10 NOTE — Telephone Encounter (Signed)
New Problem:  Pt states she wants an appt w/ Dr. Shirlee Latch. Pt denied my offer to schedule her with the PA or NP. Next available appt is 10/06/13.Marland Kitchen Pt states she needs to be seen before then. Please let pt know if she can be worked in.

## 2013-08-10 NOTE — Telephone Encounter (Signed)
Spoke with patient.

## 2013-08-10 NOTE — Telephone Encounter (Signed)
Reviewed with Dr Milda Smart plan to have pt see PA now (due in Nov 2014)  and Dr Shirlee Latch will plan to see back in May 2015. (see his May 2014 note) unless there has been some change since he saw her in May 2014.

## 2013-08-10 NOTE — Telephone Encounter (Signed)
His note from May says to see PA in 6 months and see him in 1 year. Please let her know this was his plan.

## 2013-08-10 NOTE — Telephone Encounter (Signed)
I will review with Dr Shirlee Latch.

## 2013-08-11 ENCOUNTER — Ambulatory Visit (INDEPENDENT_AMBULATORY_CARE_PROVIDER_SITE_OTHER): Payer: Medicare Other | Admitting: *Deleted

## 2013-08-11 DIAGNOSIS — I2699 Other pulmonary embolism without acute cor pulmonale: Secondary | ICD-10-CM

## 2013-08-11 DIAGNOSIS — Z7901 Long term (current) use of anticoagulants: Secondary | ICD-10-CM

## 2013-08-20 ENCOUNTER — Ambulatory Visit (INDEPENDENT_AMBULATORY_CARE_PROVIDER_SITE_OTHER): Payer: Medicare Other | Admitting: Cardiology

## 2013-08-20 ENCOUNTER — Encounter: Payer: Self-pay | Admitting: Cardiology

## 2013-08-20 VITALS — BP 88/56 | Ht 64.0 in | Wt 148.0 lb

## 2013-08-20 DIAGNOSIS — I2699 Other pulmonary embolism without acute cor pulmonale: Secondary | ICD-10-CM

## 2013-08-20 DIAGNOSIS — I2510 Atherosclerotic heart disease of native coronary artery without angina pectoris: Secondary | ICD-10-CM

## 2013-08-20 NOTE — Progress Notes (Signed)
Patient ID: Erin Roberson, female   DOB: 08-25-1924, 79 y.o.   MRN: [id number] PCP: Dr. Petra Kuba  79 yo with history of CAD and idiopathic PE presents for cardiology followup.  She is on chronic coumadin.  Last cath in 2011 showed nonobstructive CAD. She has been doing well overall.  Main complaint is a "streak" of chest pain that she will get on occasion.  It extends from her right arm across her right chest over to her left chest and lasts for a couple of seconds, then is gone.  It is not exertional.  She has an episode every week or so.  No DOE.  She is occasionally mildly lightheaded when she stands up first thing in the morning when she wakes up, otherwise no lightheadedness.    ECG: NSR, anterolateral and inferior T wave inversions  Labs (3/14): K 3.6, creatinine 0.9, HCT 40.1 Labs (9/14): LDL 85, HDL 58  PMH: 1. History of PE: Idiopathic, on chronic coumadin because no trigger for PE identified . 2. Hyperlipidemia 3. Diverticulosis 4. H/o UTI 5. H/o uterine cancer 6. Dementia 7. CAD: PTCA LAD 1992.  Last cath in 10/11 with nonobstructive disease, EF 60% by LV-gram.   SH: Divorced, lives alone in [place], no children, nonsmoker.   FH: Father with pancreatic cancer.   Current Outpatient Prescriptions  Medication Sig Dispense Refill  . atorvastatin (LIPITOR) 80 MG tablet 1/2 tablet (total 40mg ) daily      . calcium-vitamin D (OSCAL WITH D) 500-200 MG-UNIT per tablet Take 1 tablet by mouth as needed.       . donepezil (ARICEPT) 5 MG tablet Take 5 mg by mouth as needed.       . latanoprost (XALATAN) 0.005 % ophthalmic solution 1 drop at bedtime.      . polyethylene glycol powder (GLYCOLAX/MIRALAX) powder Take 17 gm in 6-8 ounces of liquid by mouth daily, may take up to twice a day if needed  255 g  0  . psyllium (METAMUCIL) 58.6 % powder Take by mouth as needed.       . senna (SENOKOT) 8.6 MG tablet Take 1 tablet by mouth as needed.        . traMADol (ULTRAM) 50 MG tablet 1-2  tablets by mouth each bedtime when necessary  30 tablet  0  . warfarin (COUMADIN) 5 MG tablet TAKE AS DIRECTED BY COUMADIN CLINIC  40 tablet  3  . zolpidem (AMBIEN) 10 MG tablet Take half a tablet as needed       No current facility-administered medications for this visit.    BP 88/56  Ht 5\' 4"  (1.626 m)  Wt 67.132 kg (148 lb)  BMI 25.39 kg/m2  SpO2 96% General: NAD Neck: No JVD, no thyromegaly or thyroid nodule.  Lungs: Clear to auscultation bilaterally with normal respiratory effort. CV: Nondisplaced PMI.  Heart regular S1/S2, no S3/S4, no murmur.  No peripheral edema.  No carotid bruit.  Normal pedal pulses.  Abdomen: Soft, nontender, no hepatosplenomegaly, no distention.  Neurologic: Alert and oriented x 3.  Psych: Normal affect. Extremities: No clubbing or cyanosis.   Assessment/Plan:  1. PE: No trigger for PE.  She is on chronic coumadin with no problems.  2. CAD: Stable.  She has atypical chest pain that is probably noncardiac.  She is not on ASA due to stable CAD with coumadin use.  She is on a statin.  3. Hyperlipidemia: Good lipids 9/14.    Followup 6 months  Erin Roberson 08/20/2013 8:03 PM

## 2013-08-20 NOTE — Patient Instructions (Signed)
Your physician wants you to follow-up in: 6 months with Dr McLean. (May 2015). You will receive a reminder letter in the mail two months in advance. If you don't receive a letter, please call our office to schedule the follow-up appointment.  

## 2013-09-01 ENCOUNTER — Ambulatory Visit (INDEPENDENT_AMBULATORY_CARE_PROVIDER_SITE_OTHER): Payer: Medicare Other | Admitting: General Practice

## 2013-09-01 DIAGNOSIS — Z7901 Long term (current) use of anticoagulants: Secondary | ICD-10-CM

## 2013-09-01 DIAGNOSIS — I2699 Other pulmonary embolism without acute cor pulmonale: Secondary | ICD-10-CM

## 2013-09-15 ENCOUNTER — Ambulatory Visit (INDEPENDENT_AMBULATORY_CARE_PROVIDER_SITE_OTHER): Payer: Medicare Other

## 2013-09-15 DIAGNOSIS — I2699 Other pulmonary embolism without acute cor pulmonale: Secondary | ICD-10-CM

## 2013-09-15 DIAGNOSIS — Z7901 Long term (current) use of anticoagulants: Secondary | ICD-10-CM

## 2013-09-15 LAB — POCT INR: INR: 1.6

## 2013-10-02 ENCOUNTER — Ambulatory Visit (INDEPENDENT_AMBULATORY_CARE_PROVIDER_SITE_OTHER): Payer: Medicare Other | Admitting: *Deleted

## 2013-10-02 DIAGNOSIS — I2699 Other pulmonary embolism without acute cor pulmonale: Secondary | ICD-10-CM

## 2013-10-02 DIAGNOSIS — Z7901 Long term (current) use of anticoagulants: Secondary | ICD-10-CM

## 2013-10-02 LAB — POCT INR: INR: 1.4

## 2013-10-06 ENCOUNTER — Emergency Department (HOSPITAL_COMMUNITY)
Admission: EM | Admit: 2013-10-06 | Discharge: 2013-10-06 | Disposition: A | Discharge status: Home / Self Care | Payer: Medicare Other | Attending: Emergency Medicine | Admitting: Emergency Medicine

## 2013-10-06 ENCOUNTER — Emergency Department (HOSPITAL_COMMUNITY): Payer: Medicare Other

## 2013-10-06 ENCOUNTER — Encounter (HOSPITAL_COMMUNITY): Payer: Self-pay | Admitting: Emergency Medicine

## 2013-10-06 DIAGNOSIS — I2510 Atherosclerotic heart disease of native coronary artery without angina pectoris: Secondary | ICD-10-CM | POA: Insufficient documentation

## 2013-10-06 DIAGNOSIS — Z862 Personal history of diseases of the blood and blood-forming organs and certain disorders involving the immune mechanism: Secondary | ICD-10-CM | POA: Insufficient documentation

## 2013-10-06 DIAGNOSIS — Z8742 Personal history of other diseases of the female genital tract: Secondary | ICD-10-CM | POA: Insufficient documentation

## 2013-10-06 DIAGNOSIS — Z8673 Personal history of transient ischemic attack (TIA), and cerebral infarction without residual deficits: Secondary | ICD-10-CM | POA: Insufficient documentation

## 2013-10-06 DIAGNOSIS — Z86711 Personal history of pulmonary embolism: Secondary | ICD-10-CM | POA: Insufficient documentation

## 2013-10-06 DIAGNOSIS — Z9861 Coronary angioplasty status: Secondary | ICD-10-CM | POA: Insufficient documentation

## 2013-10-06 DIAGNOSIS — Z8659 Personal history of other mental and behavioral disorders: Secondary | ICD-10-CM | POA: Insufficient documentation

## 2013-10-06 DIAGNOSIS — F0390 Unspecified dementia without behavioral disturbance: Secondary | ICD-10-CM | POA: Insufficient documentation

## 2013-10-06 DIAGNOSIS — Z79899 Other long term (current) drug therapy: Secondary | ICD-10-CM | POA: Insufficient documentation

## 2013-10-06 DIAGNOSIS — Z8719 Personal history of other diseases of the digestive system: Secondary | ICD-10-CM | POA: Insufficient documentation

## 2013-10-06 DIAGNOSIS — Y9289 Other specified places as the place of occurrence of the external cause: Secondary | ICD-10-CM | POA: Insufficient documentation

## 2013-10-06 DIAGNOSIS — Y9301 Activity, walking, marching and hiking: Secondary | ICD-10-CM | POA: Insufficient documentation

## 2013-10-06 DIAGNOSIS — Z8739 Personal history of other diseases of the musculoskeletal system and connective tissue: Secondary | ICD-10-CM | POA: Insufficient documentation

## 2013-10-06 DIAGNOSIS — S82899A Other fracture of unspecified lower leg, initial encounter for closed fracture: Secondary | ICD-10-CM | POA: Insufficient documentation

## 2013-10-06 DIAGNOSIS — Z7901 Long term (current) use of anticoagulants: Secondary | ICD-10-CM | POA: Insufficient documentation

## 2013-10-06 DIAGNOSIS — X500XXA Overexertion from strenuous movement or load, initial encounter: Secondary | ICD-10-CM | POA: Insufficient documentation

## 2013-10-06 DIAGNOSIS — E7800 Pure hypercholesterolemia, unspecified: Secondary | ICD-10-CM | POA: Insufficient documentation

## 2013-10-06 DIAGNOSIS — R296 Repeated falls: Secondary | ICD-10-CM | POA: Insufficient documentation

## 2013-10-06 DIAGNOSIS — Z8542 Personal history of malignant neoplasm of other parts of uterus: Secondary | ICD-10-CM | POA: Insufficient documentation

## 2013-10-06 DIAGNOSIS — Z8669 Personal history of other diseases of the nervous system and sense organs: Secondary | ICD-10-CM | POA: Insufficient documentation

## 2013-10-06 DIAGNOSIS — E663 Overweight: Secondary | ICD-10-CM | POA: Insufficient documentation

## 2013-10-06 DIAGNOSIS — Z87440 Personal history of urinary (tract) infections: Secondary | ICD-10-CM | POA: Insufficient documentation

## 2013-10-06 DIAGNOSIS — S82839A Other fracture of upper and lower end of unspecified fibula, initial encounter for closed fracture: Secondary | ICD-10-CM

## 2013-10-06 MED ORDER — HYDROCODONE-ACETAMINOPHEN 5-325 MG PO TABS: 1.0000 | ORAL_TABLET | Freq: Four times a day (QID) | ORAL | Status: DC | PRN

## 2013-10-06 MED ORDER — HYDROCODONE-ACETAMINOPHEN 5-325 MG PO TABS
1.0000 | ORAL_TABLET | Freq: Once | ORAL | Status: AC
  Administered 2013-10-06: 1 via ORAL
  Filled 2013-10-06: qty 1

## 2013-10-06 NOTE — ED Notes (Signed)
Pt states around 1530 she was walking into storage unit, pt fell and twisted left ankle. Pt states ankle is swollen. Denies LOC, dizziness, or hitting head.

## 2013-10-06 NOTE — ED Provider Notes (Signed)
Medical screening examination/treatment/procedure(s) were performed by non-physician practitioner and as supervising physician I was immediately available for consultation/collaboration.  Shanna CiscoMegan E Docherty, MD 10/06/13 [phone]

## 2013-10-06 NOTE — ED Notes (Signed)
Pt has a ride home.  

## 2013-10-06 NOTE — ED Provider Notes (Signed)
CSN: [id number]     Arrival date & time 10/06/13  1726 History  This chart was scribed for non-physician practitioner, Lowella DellGray Makhiya Coburn, PA-C,working with Shanna CiscoMegan E Docherty, MD, by Karle PlumberJennifer Tensley, ED Scribe.  This patient was seen in room WTR9/WTR9 and the patient's care was started at 7:43 PM.  Chief Complaint  Patient presents with  . Ankle Injury   HPI HPI Comments:  Erin Roberson is a 80 y.o. female with h/o osteoporosis who presents to the Emergency Department complaining of left ankle injury secondary to falling approximately four hours ago. She states she tripped because she missed a step. She reports associated severe aching pain and swelling. She states the pain is 7/10. She reports spraining the same ankle several years ago. She denies any knee or shin pain. Pt was brought in by a friend and states she has not been able to bear weight on her ankle. She denies head injury or LOC. She denies dizziness prior to the fall. She denies h/o kidney problems. She reports taking Warfarin daily for PE.    Past Medical History  Diagnosis Date  . Other pulmonary embolism and infarction   . Pure hypercholesterolemia   . Overweight   . Depression   . Unspecified cardiovascular disease   . Iron deficiency anemia, unspecified   . Hemorrhage of rectum and anus   . Leukocytopenia, unspecified   . Urinary hesitancy   . Diverticulosis of colon (without mention of hemorrhage)   . Coronary atherosclerosis   . Glaucoma   . Fibromyalgia   . UTI (lower urinary tract infection)   . History of uterine cancer   . Clotting disorder   . Thyroid disease   . Dementia   . Breast mass     left breast  . Leukopenia     chronic--benign  . Arthritis    Past Surgical History  Procedure Laterality Date  . Cataract extraction  2004    right  . Cardiac catheterization  07/25/10  . Abdominal hysterectomy    . Coronary angioplasty  1992  . Colonoscopy  07/01/2007    diverticulosis  . Upper gastrointestinal  endoscopy  07/16/2007    tortous esophagus with spasms  . Thyroid needle aspiration  12/2007    right, hyperplastic nodule  . Kidney surgery      stent placed  . Neck surgery     Family History  Problem Relation Age of Onset  . Diabetes Mother     deceased age 10105  . Pancreatic cancer Father     deceased age 78  . Emphysema Brother     deceased  . Alcohol abuse Brother   . Colon cancer Neg Hx   . Osteoporosis     History  Substance Use Topics  . Smoking status: Never Smoker   . Smokeless tobacco: Never Used  . Alcohol Use: No   OB History   Grav Para Term Preterm Abortions TAB SAB Ect Mult Living                 Review of Systems  Musculoskeletal: Positive for joint swelling (left ankle).    Allergies  Review of patient's allergies indicates no known allergies.  Home Medications   Current Outpatient Rx  Name  Route  Sig  Dispense  Refill  . atorvastatin (LIPITOR) 80 MG tablet   Oral   Take 80 mg by mouth daily at 6 PM. 1/2 tablet (total 40mg ) daily         .  calcium-vitamin D (OSCAL WITH D) 500-200 MG-UNIT per tablet   Oral   Take 1 tablet by mouth as needed.          . donepezil (ARICEPT) 5 MG tablet   Oral   Take 5 mg by mouth at bedtime.          Marland Kitchen latanoprost (XALATAN) 0.005 % ophthalmic solution   Both Eyes   Place 1 drop into both eyes at bedtime.          Marland Kitchen OVER THE COUNTER MEDICATION   Oral   Take 8 oz by mouth 3 (three) times daily. PRUNE JUICE         . warfarin (COUMADIN) 5 MG tablet   Oral   Take 5 mg by mouth daily.         Marland Kitchen HYDROcodone-acetaminophen (NORCO/VICODIN) 5-325 MG per tablet   Oral   Take 1 tablet by mouth every 6 (six) hours as needed for moderate pain.   12 tablet   0    Triage Vitals: BP 119/63  Pulse 78  Temp(Src) 97.8 F (36.6 C) (Oral)  Resp 16  SpO2 97% Physical Exam  Nursing note and vitals reviewed. Constitutional: She is oriented to person, place, and time. She appears well-developed and  well-nourished.  HENT:  Head: Normocephalic and atraumatic.  Eyes: EOM are normal.  Neck: Normal range of motion.  Cardiovascular: Normal rate, regular rhythm and normal heart sounds.  Exam reveals no gallop and no friction rub.   No murmur heard. Pedal pulses intact bilaterally. Good cap refill.  Pulmonary/Chest: Effort normal and breath sounds normal. No respiratory distress. She has no wheezes. She has no rales. She exhibits no tenderness.  Musculoskeletal: Normal range of motion.  Tenderness over lateral malleolus of left ankle. Tenderness at distal fibula just above left ankle. ROM of left ankle limited secondary to pain and swelling. Pt able to dorsi flex and plantar flex ankle. No musculoskeletal tenderness other than left ankle. Normal left knee and hip.   Neurological: She is alert and oriented to person, place, and time.  Sensations intact.  Skin: Skin is warm and dry.  Psychiatric: She has a normal mood and affect. Her behavior is normal.    ED Course  Procedures (including critical care time) DIAGNOSTIC STUDIES: Oxygen Saturation is 97% on RA, normal by my interpretation.   COORDINATION OF CARE: 7:50 PM- Will X-Ray left ankle. Will give pain medication prior to X-Ray. Pt verbalizes understanding and agrees to plan.  8:32 PM- Discussed pt with Earley Favor, FNP and pt signed over to her.   8:37 PM- Will contact orthopedist on call.    Medications  HYDROcodone-acetaminophen (NORCO/VICODIN) 5-325 MG per tablet 1 tablet (1 tablet Oral Given 10/06/13 2028)    Labs Review Labs Reviewed - No data to display Imaging Review No results found.  EKG Interpretation   None       MDM   1. Fracture of distal fibula    Plain films show distal fibular fracture with angulation and mild displacement. Plan to contact orthopedist on call.  Patient signed over to Earley Favor, FNP.   Meds given in ED:  Medications  HYDROcodone-acetaminophen (NORCO/VICODIN) 5-325 MG per tablet  1 tablet (1 tablet Oral Given 10/06/13 2028)    Discharge Medication List as of 10/06/2013  9:16 PM    START taking these medications   Details  HYDROcodone-acetaminophen (NORCO/VICODIN) 5-325 MG per tablet Take 1 tablet by mouth every 6 (six) hours as  needed for moderate pain., Starting 10/06/2013, Until Discontinued, Print         I personally performed the services described in this documentation, which was scribed in my presence. The recorded information has been reviewed and is accurate.    Rudene Anda, PA-C 10/14/13 2238

## 2013-10-06 NOTE — ED Provider Notes (Signed)
I reviewed.  The x-ray with Dr. Carola FrostHandy.  He agrees with the plan of a Verdene Rioam Walker, and office followup  Arman FilterGail K Hope Brandenburger, NP 10/06/13 2116

## 2013-10-06 NOTE — ED Notes (Signed)
Ortho tech at bedside 

## 2013-10-06 NOTE — Discharge Instructions (Signed)
Please wear the Cam Walker at all times, while you're not ambulating, you can take this off to bath  and to sleep.  Please call Dr. Magdalene PatriciaHandy's office first thing in the morning to set an appointment for followup evaluation.  So he can monitor the healing of her fracture

## 2013-10-06 NOTE — Progress Notes (Signed)
Orthopedic Tech Progress Note Patient Details:  Erin Roberson Nov 20, 1923 [id number] CAM walker applied to Left LE. Application tolerated well Ortho Devices Type of Ortho Device: CAM walker Ortho Device/Splint Location: Left LE Ortho Device/Splint Interventions: Application   Asia R Thompson 10/06/2013, 9:26 PM

## 2013-10-15 NOTE — ED Provider Notes (Signed)
Medical screening examination/treatment/procedure(s) were performed by non-physician practitioner and as supervising physician I was immediately available for consultation/collaboration.  EKG Interpretation   None         Herberth Deharo E Eusebia Grulke, MD 10/15/13 0749 

## 2013-10-16 ENCOUNTER — Ambulatory Visit (INDEPENDENT_AMBULATORY_CARE_PROVIDER_SITE_OTHER): Payer: Medicare Other | Admitting: *Deleted

## 2013-10-16 DIAGNOSIS — Z7901 Long term (current) use of anticoagulants: Secondary | ICD-10-CM

## 2013-10-16 DIAGNOSIS — I2699 Other pulmonary embolism without acute cor pulmonale: Secondary | ICD-10-CM

## 2013-10-16 LAB — POCT INR: INR: 1.9

## 2013-10-26 ENCOUNTER — Telehealth: Payer: Self-pay | Admitting: Cardiology

## 2013-10-26 NOTE — Telephone Encounter (Signed)
Follow up ° ° ° ° ° ° ° ° ° °Pt returning nurses call °

## 2013-10-26 NOTE — Telephone Encounter (Signed)
Walk In pt Form " Adult Care [place]2" Form [facility] Off [facility]

## 2013-10-26 NOTE — Telephone Encounter (Signed)
Pt's daughter advised Dr Shirlee LatchMcLean does not complete [place] 2 form. She did not want me to mail blank form that she dropped off back to her.

## 2013-10-26 NOTE — Telephone Encounter (Signed)
New Problem:  Pt's daughter, Aram BeechamCynthia, is calling to inquire about the status of a [place] 2 form. She would like a call back.

## 2013-10-30 ENCOUNTER — Ambulatory Visit (INDEPENDENT_AMBULATORY_CARE_PROVIDER_SITE_OTHER): Payer: Medicare Other | Admitting: *Deleted

## 2013-10-30 DIAGNOSIS — Z5181 Encounter for therapeutic drug level monitoring: Secondary | ICD-10-CM

## 2013-10-30 DIAGNOSIS — Z7901 Long term (current) use of anticoagulants: Secondary | ICD-10-CM

## 2013-10-30 DIAGNOSIS — I2699 Other pulmonary embolism without acute cor pulmonale: Secondary | ICD-10-CM

## 2013-10-30 LAB — POCT INR: INR: 3

## 2013-11-12 ENCOUNTER — Emergency Department (HOSPITAL_COMMUNITY)
Admission: EM | Admit: 2013-11-12 | Discharge: 2013-11-12 | Disposition: A | Discharge status: Home / Self Care | Payer: Medicare Other | Source: Home / Self Care

## 2013-11-12 ENCOUNTER — Encounter (HOSPITAL_COMMUNITY): Payer: Self-pay | Admitting: Emergency Medicine

## 2013-11-12 DIAGNOSIS — R109 Unspecified abdominal pain: Secondary | ICD-10-CM

## 2013-11-12 DIAGNOSIS — G4700 Insomnia, unspecified: Secondary | ICD-10-CM

## 2013-11-12 DIAGNOSIS — R0982 Postnasal drip: Secondary | ICD-10-CM

## 2013-11-12 DIAGNOSIS — R531 Weakness: Secondary | ICD-10-CM

## 2013-11-12 DIAGNOSIS — R059 Cough, unspecified: Secondary | ICD-10-CM

## 2013-11-12 DIAGNOSIS — R5381 Other malaise: Secondary | ICD-10-CM

## 2013-11-12 DIAGNOSIS — R05 Cough: Secondary | ICD-10-CM

## 2013-11-12 DIAGNOSIS — R5383 Other fatigue: Secondary | ICD-10-CM

## 2013-11-12 LAB — POCT I-STAT, CHEM 8
BUN: 23 mg/dL (ref 6–23)
CALCIUM ION: 1.18 mmol/L (ref 1.13–1.30)
Chloride: 104 mEq/L (ref 96–112)
Creatinine, Ser: 1 mg/dL (ref 0.50–1.10)
GLUCOSE: 102 mg/dL — AB (ref 70–99)
HEMATOCRIT: 40 % (ref 36.0–46.0)
Hemoglobin: 13.6 g/dL (ref 12.0–15.0)
Potassium: 4.2 mEq/L (ref 3.7–5.3)
Sodium: 143 mEq/L (ref 137–147)
TCO2: 30 mmol/L (ref 0–100)

## 2013-11-12 MED ORDER — GUAIFENESIN-CODEINE 100-10 MG/5ML PO SYRP: 5.0000 mL | ORAL_SOLUTION | ORAL | Status: DC | PRN

## 2013-11-12 NOTE — ED Provider Notes (Signed)
CSN: [id number]     Arrival date & time 11/12/13  1515 History   First MD Initiated Contact with Patient 11/12/13 1608     Chief Complaint  Patient presents with  . URI     (Consider location/radiation/quality/duration/timing/severity/associated sxs/prior Treatment) HPI Comments: 80 year old female presents with a productive cough, abdominal pain upon coughing, general weakness for the past week. She states she has to clear her throat frequently. Denies earache, sore throat or PND. Denies shortness of breath or chest pain. Her second complaint is that of insomnia that has been a problem for several months.   Past Medical History  Diagnosis Date  . Other pulmonary embolism and infarction   . Pure hypercholesterolemia   . Overweight   . Depression   . Unspecified cardiovascular disease   . Iron deficiency anemia, unspecified   . Hemorrhage of rectum and anus   . Leukocytopenia, unspecified   . Urinary hesitancy   . Diverticulosis of colon (without mention of hemorrhage)   . Coronary atherosclerosis   . Glaucoma   . Fibromyalgia   . UTI (lower urinary tract infection)   . History of uterine cancer   . Clotting disorder   . Thyroid disease   . Dementia   . Breast mass     left breast  . Leukopenia     chronic--benign  . Arthritis    Past Surgical History  Procedure Laterality Date  . Cataract extraction  2004    right  . Cardiac catheterization  07/25/10  . Abdominal hysterectomy    . Coronary angioplasty  1992  . Colonoscopy  07/01/2007    diverticulosis  . Upper gastrointestinal endoscopy  07/16/2007    tortous esophagus with spasms  . Thyroid needle aspiration  12/2007    right, hyperplastic nodule  . Kidney surgery      stent placed  . Neck surgery     Family History  Problem Relation Age of Onset  . Diabetes Mother     deceased age 31  . Pancreatic cancer Father     deceased age 31  . Emphysema Brother     deceased  . Alcohol abuse Brother   . Colon  cancer Neg Hx   . Osteoporosis     History  Substance Use Topics  . Smoking status: Never Smoker   . Smokeless tobacco: Never Used  . Alcohol Use: No   OB History   Grav Para Term Preterm Abortions TAB SAB Ect Mult Living                 Review of Systems  Constitutional: Positive for activity change, appetite change and fatigue. Negative for fever.  HENT: Negative for congestion, ear pain, postnasal drip, rhinorrhea and sore throat.   Respiratory: Positive for cough. Negative for chest tightness and shortness of breath.   Cardiovascular: Negative.   Gastrointestinal: Positive for abdominal pain.  Genitourinary:       Dark colored urine  Skin: Negative.   Neurological: Negative.   Hematological: Bruises/bleeds easily.      Allergies  Review of patient's allergies indicates no known allergies.  Home Medications   Current Outpatient Rx  Name  Route  Sig  Dispense  Refill  . atorvastatin (LIPITOR) 80 MG tablet   Oral   Take 80 mg by mouth daily at 6 PM. 1/2 tablet (total 40mg ) daily         . donepezil (ARICEPT) 5 MG tablet   Oral   Take 5  mg by mouth at bedtime.          Marland Kitchen latanoprost (XALATAN) 0.005 % ophthalmic solution   Both Eyes   Place 1 drop into both eyes at bedtime.          Marland Kitchen OVER THE COUNTER MEDICATION   Oral   Take 8 oz by mouth 3 (three) times daily. PRUNE JUICE         . warfarin (COUMADIN) 5 MG tablet   Oral   Take 5 mg by mouth daily.         . calcium-vitamin D (OSCAL WITH D) 500-200 MG-UNIT per tablet   Oral   Take 1 tablet by mouth as needed.          Marland Kitchen guaiFENesin-codeine (CHERATUSSIN AC) 100-10 MG/5ML syrup   Oral   Take 5 mLs by mouth every 4 (four) hours as needed for cough or congestion.   120 mL   0   . HYDROcodone-acetaminophen (NORCO/VICODIN) 5-325 MG per tablet   Oral   Take 1 tablet by mouth every 6 (six) hours as needed for moderate pain.   12 tablet   0    BP 126/67  Pulse 82  Temp(Src) 97.6 F (36.4  C) (Oral)  Resp 18  SpO2 96% Physical Exam  Nursing note and vitals reviewed. Constitutional: She is oriented to person, place, and time. She appears well-developed and well-nourished. No distress.  HENT:  Mouth/Throat: No oropharyngeal exudate.  Bilateral TMs are normal, no erythema or bulging. Oropharynx is erythematous with cobblestoning in thick, copious clear PND  Eyes: Conjunctivae and EOM are normal.  Neck: Normal range of motion. Neck supple.  Cardiovascular: Normal rate, regular rhythm and normal heart sounds.   Pulmonary/Chest: Effort normal and breath sounds normal. No respiratory distress. She has no wheezes.  Abdominal: Soft. She exhibits no distension and no mass. There is no rebound and no guarding.  There is. Umbilical tenderness as well as tenderness along the mid line from the umbilicus to the suprapubic. When the patient is lying supine on the table and raises her head this produces pain in the above areas. Straight leg raises also produces similar pain. There is tenderness to Palpation around the emboli this as well as the musculature in the lower midabdomen. No tenderness in the upper half of the abdomen.  Musculoskeletal:  She is wearing a Cam Walker from a recent ankle fracture.  Lymphadenopathy:    She has no cervical adenopathy.  Neurological: She is alert and oriented to person, place, and time.  Skin: Skin is warm and dry.  Psychiatric: She has a normal mood and affect.    ED Course  Procedures (including critical care time) Labs Review Labs Reviewed - No data to display Imaging Review No results found. Results for orders placed during the hospital encounter of 11/12/13  POCT I-STAT, CHEM 8      Result Value Ref Range   Sodium 143  137 - 147 mEq/L   Potassium 4.2  3.7 - 5.3 mEq/L   Chloride 104  96 - 112 mEq/L   BUN 23  6 - 23 mg/dL   Creatinine, Ser 1.61  0.50 - 1.10 mg/dL   Glucose, Bld 096 (*) 70 - 99 mg/dL   Calcium, Ion 0.45  4.09 - 1.30  mmol/L   TCO2 30  0 - 100 mmol/L   Hemoglobin 13.6  12.0 - 15.0 g/dL   HCT 81.1  91.4 - 78.2 %  MDM   Final diagnoses:  PND (post-nasal drip)  Cough  Abdominal wall pain  General weakness  Insomnia    Cheratussin a.c. as directed for cough Chlor-Trimeton 2 mg every 6 hours when necessary drainage Drink plenty of fluids and stay well hydrated Followup with your PCP within the next 7 days Recheck promptly for any symptoms problems or worsening.    Hayden Rasmussenavid Mellody Masri, NP 11/12/13 [id number]

## 2013-11-12 NOTE — Discharge Instructions (Signed)
Cough, Adult Chlor trimeton 2 mg every 6 hours for drainage   A cough is a reflex that helps clear your throat and airways. It can help heal the body or may be a reaction to an irritated airway. A cough may only last 2 or 3 weeks (acute) or may last more than 8 weeks (chronic).  CAUSES Acute cough:  Viral or bacterial infections. Chronic cough:  Infections.  Allergies.  Asthma.  Post-nasal drip.  Smoking.  Heartburn or acid reflux.  Some medicines.  Chronic lung problems (COPD).  Cancer. SYMPTOMS   Cough.  Fever.  Chest pain.  Increased breathing rate.  High-pitched whistling sound when breathing (wheezing).  Colored mucus that you cough up (sputum). TREATMENT   A bacterial cough may be treated with antibiotic medicine.  A viral cough must run its course and will not respond to antibiotics.  Your caregiver may recommend other treatments if you have a chronic cough. HOME CARE INSTRUCTIONS   Only take over-the-counter or prescription medicines for pain, discomfort, or fever as directed by your caregiver. Use cough suppressants only as directed by your caregiver.  Use a cold steam vaporizer or humidifier in your bedroom or home to help loosen secretions.  Sleep in a semi-upright position if your cough is worse at night.  Rest as needed.  Stop smoking if you smoke. SEEK IMMEDIATE MEDICAL CARE IF:   You have pus in your sputum.  Your cough starts to worsen.  You cannot control your cough with suppressants and are losing sleep.  You begin coughing up blood.  You have difficulty breathing.  You develop pain which is getting worse or is uncontrolled with medicine.  You have a fever. MAKE SURE YOU:   Understand these instructions.  Will watch your condition.  Will get help right away if you are not doing well or get worse. Document Released: 03/16/2011 Document Revised: 12/10/2011 Document Reviewed: 03/16/2011 [facility] Patient Information 2014  [place], [place].  Fatigue Fatigue is a feeling of tiredness, lack of energy, lack of motivation, or feeling tired all the time. Having enough rest, good nutrition, and reducing stress will normally reduce fatigue. Consult your caregiver if it persists. The nature of your fatigue will help your caregiver to find out its cause. The treatment is based on the cause.  CAUSES  There are many causes for fatigue. Most of the time, fatigue can be traced to one or more of your habits or routines. Most causes fit into one or more of three general areas. They are: Lifestyle problems  Sleep disturbances.  Overwork.  Physical exertion.  Unhealthy habits.  Poor eating habits or eating disorders.  Alcohol and/or drug use .  Lack of proper nutrition (malnutrition). Psychological problems  Stress and/or anxiety problems.  Depression.  Grief.  Boredom. Medical Problems or Conditions  Anemia.  Pregnancy.  Thyroid gland problems.  Recovery from major surgery.  Continuous pain.  Emphysema or asthma that is not well controlled  Allergic conditions.  Diabetes.  Infections (such as mononucleosis).  Obesity.  Sleep disorders, such as sleep apnea.  Heart failure or other heart-related problems.  Cancer.  Kidney disease.  Liver disease.  Effects of certain medicines such as antihistamines, cough and cold remedies, prescription pain medicines, heart and blood pressure medicines, drugs used for treatment of cancer, and some antidepressants. SYMPTOMS  The symptoms of fatigue include:   Lack of energy.  Lack of drive (motivation).  Drowsiness.  Feeling of indifference to the surroundings. DIAGNOSIS  The  details of how you feel help guide your caregiver in finding out what is causing the fatigue. You will be asked about your present and past health condition. It is important to review all medicines that you take, including prescription and non-prescription items. A thorough  exam will be done. You will be questioned about your feelings, habits, and normal lifestyle. Your caregiver may suggest blood tests, urine tests, or other tests to look for common medical causes of fatigue.  TREATMENT  Fatigue is treated by correcting the underlying cause. For example, if you have continuous pain or depression, treating these causes will improve how you feel. Similarly, adjusting the dose of certain medicines will help in reducing fatigue.  HOME CARE INSTRUCTIONS   Try to get the required amount of good sleep every night.  Eat a healthy and nutritious diet, and drink enough water throughout the day.  Practice ways of relaxing (including yoga or meditation).  Exercise regularly.  Make plans to change situations that cause stress. Act on those plans so that stresses decrease over time. Keep your work and personal routine reasonable.  Avoid street drugs and minimize use of alcohol.  Start taking a daily multivitamin after consulting your caregiver. SEEK MEDICAL CARE IF:   You have persistent tiredness, which cannot be accounted for.  You have fever.  You have unintentional weight loss.  You have headaches.  You have disturbed sleep throughout the night.  You are feeling sad.  You have constipation.  You have dry skin.  You have gained weight.  You are taking any new or different medicines that you suspect are causing fatigue.  You are unable to sleep at night.  You develop any unusual swelling of your legs or other parts of your body. SEEK IMMEDIATE MEDICAL CARE IF:   You are feeling confused.  Your vision is blurred.  You feel faint or pass out.  You develop severe headache.  You develop severe abdominal, pelvic, or back pain.  You develop chest pain, shortness of breath, or an irregular or fast heartbeat.  You are unable to pass a normal amount of urine.  You develop abnormal bleeding such as bleeding from the rectum or you vomit  blood.  You have thoughts about harming yourself or committing suicide.  You are worried that you might harm someone else. MAKE SURE YOU:   Understand these instructions.  Will watch your condition.  Will get help right away if you are not doing well or get worse. Document Released: 07/15/2007 Document Revised: 12/10/2011 Document Reviewed: 07/15/2007 [facility] Patient Information 2014 [place], [place].  Insomnia Insomnia is frequent trouble falling and/or staying asleep. Insomnia can be a long term problem or a short term problem. Both are common. Insomnia can be a short term problem when the wakefulness is related to a certain stress or worry. Long term insomnia is often related to ongoing stress during waking hours and/or poor sleeping habits. Overtime, sleep deprivation itself can make the problem worse. Every little thing feels more severe because you are overtired and your ability to cope is decreased. CAUSES   Stress, anxiety, and depression.  Poor sleeping habits.  Distractions such as TV in the bedroom.  Naps close to bedtime.  Engaging in emotionally charged conversations before bed.  Technical reading before sleep.  Alcohol and other sedatives. They may make the problem worse. They can hurt normal sleep patterns and normal dream activity.  Stimulants such as caffeine for several hours prior to bedtime.  Pain syndromes  and shortness of breath can cause insomnia.  Exercise late at night.  Changing time zones may cause sleeping problems (jet lag). It is sometimes helpful to have someone observe your sleeping patterns. They should look for periods of not breathing during the night (sleep apnea). They should also look to see how long those periods last. If you live alone or observers are uncertain, you can also be observed at a sleep clinic where your sleep patterns will be professionally monitored. Sleep apnea requires a checkup and treatment. Give your caregivers your  medical history. Give your caregivers observations your family has made about your sleep.  SYMPTOMS   Not feeling rested in the morning.  Anxiety and restlessness at bedtime.  Difficulty falling and staying asleep. TREATMENT   Your caregiver may prescribe treatment for an underlying medical disorders. Your caregiver can give advice or help if you are using alcohol or other drugs for self-medication. Treatment of underlying problems will usually eliminate insomnia problems.  Medications can be prescribed for short time use. They are generally not recommended for lengthy use.  Over-the-counter sleep medicines are not recommended for lengthy use. They can be habit forming.  You can promote easier sleeping by making lifestyle changes such as:  Using relaxation techniques that help with breathing and reduce muscle tension.  Exercising earlier in the day.  Changing your diet and the time of your last meal. No night time snacks.  Establish a regular time to go to bed.  Counseling can help with stressful problems and worry.  Soothing music and white noise may be helpful if there are background noises you cannot remove.  Stop tedious detailed work at least one hour before bedtime. HOME CARE INSTRUCTIONS   Keep a diary. Inform your caregiver about your progress. This includes any medication side effects. See your caregiver regularly. Take note of:  Times when you are asleep.  Times when you are awake during the night.  The quality of your sleep.  How you feel the next day. This information will help your caregiver care for you.  Get out of bed if you are still awake after 15 minutes. Read or do some quiet activity. Keep the lights down. Wait until you feel sleepy and go back to bed.  Keep regular sleeping and waking hours. Avoid naps.  Exercise regularly.  Avoid distractions at bedtime. Distractions include watching television or engaging in any intense or detailed activity  like attempting to balance the household checkbook.  Develop a bedtime ritual. Keep a familiar routine of bathing, brushing your teeth, climbing into bed at the same time each night, listening to soothing music. Routines increase the success of falling to sleep faster.  Use relaxation techniques. This can be using breathing and muscle tension release routines. It can also include visualizing peaceful scenes. You can also help control troubling or intruding thoughts by keeping your mind occupied with boring or repetitive thoughts like the old concept of counting sheep. You can make it more creative like imagining planting one beautiful flower after another in your backyard garden.  During your day, work to eliminate stress. When this is not possible use some of the previous suggestions to help reduce the anxiety that accompanies stressful situations. MAKE SURE YOU:   Understand these instructions.  Will watch your condition.  Will get help right away if you are not doing well or get worse. Document Released: 09/14/2000 Document Revised: 12/10/2011 Document Reviewed: 10/15/2007 [facility] Patient Information 2014 [place], [place].  Upper Respiratory  Infection, Adult An upper respiratory infection (URI) is also sometimes known as the common cold. The upper respiratory tract includes the nose, sinuses, throat, trachea, and bronchi. Bronchi are the airways leading to the lungs. Most people improve within 1 week, but symptoms can last up to 2 weeks. A residual cough may last even longer.  CAUSES Many different viruses can infect the tissues lining the upper respiratory tract. The tissues become irritated and inflamed and often become very moist. Mucus production is also common. A cold is contagious. You can easily spread the virus to others by oral contact. This includes kissing, sharing a glass, coughing, or sneezing. Touching your mouth or nose and then touching a surface, which is then touched by  another person, can also spread the virus. SYMPTOMS  Symptoms typically develop 1 to 3 days after you come in contact with a cold virus. Symptoms vary from person to person. They may include:  Runny nose.  Sneezing.  Nasal congestion.  Sinus irritation.  Sore throat.  Loss of voice (laryngitis).  Cough.  Fatigue.  Muscle aches.  Loss of appetite.  Headache.  Low-grade fever. DIAGNOSIS  You might diagnose your own cold based on familiar symptoms, since most people get a cold 2 to 3 times a year. Your caregiver can confirm this based on your exam. Most importantly, your caregiver can check that your symptoms are not due to another disease such as strep throat, sinusitis, pneumonia, asthma, or epiglottitis. Blood tests, throat tests, and X-rays are not necessary to diagnose a common cold, but they may sometimes be helpful in excluding other more serious diseases. Your caregiver will decide if any further tests are required. RISKS AND COMPLICATIONS  You may be at risk for a more severe case of the common cold if you smoke cigarettes, have chronic heart disease (such as heart failure) or lung disease (such as asthma), or if you have a weakened immune system. The very young and very old are also at risk for more serious infections. Bacterial sinusitis, middle ear infections, and bacterial pneumonia can complicate the common cold. The common cold can worsen asthma and chronic obstructive pulmonary disease (COPD). Sometimes, these complications can require emergency medical care and may be life-threatening. PREVENTION  The best way to protect against getting a cold is to practice good hygiene. Avoid oral or hand contact with people with cold symptoms. Wash your hands often if contact occurs. There is no clear evidence that vitamin C, vitamin E, echinacea, or exercise reduces the chance of developing a cold. However, it is always recommended to get plenty of rest and practice good  nutrition. TREATMENT  Treatment is directed at relieving symptoms. There is no cure. Antibiotics are not effective, because the infection is caused by a virus, not by bacteria. Treatment may include:  Increased fluid intake. Sports drinks offer valuable electrolytes, sugars, and fluids.  Breathing heated mist or steam (vaporizer or shower).  Eating chicken soup or other clear broths, and maintaining good nutrition.  Getting plenty of rest.  Using gargles or lozenges for comfort.  Controlling fevers with ibuprofen or acetaminophen as directed by your caregiver.  Increasing usage of your inhaler if you have asthma. Zinc gel and zinc lozenges, taken in the first 24 hours of the common cold, can shorten the duration and lessen the severity of symptoms. Pain medicines may help with fever, muscle aches, and throat pain. A variety of non-prescription medicines are available to treat congestion and runny nose. Your caregiver can  make recommendations and may suggest nasal or lung inhalers for other symptoms.  HOME CARE INSTRUCTIONS   Only take over-the-counter or prescription medicines for pain, discomfort, or fever as directed by your caregiver.  Use a warm mist humidifier or inhale steam from a shower to increase air moisture. This may keep secretions moist and make it easier to breathe.  Drink enough water and fluids to keep your urine clear or pale yellow.  Rest as needed.  Return to work when your temperature has returned to normal or as your caregiver advises. You may need to stay home longer to avoid infecting others. You can also use a face mask and careful hand washing to prevent spread of the virus. SEEK MEDICAL CARE IF:   After the first few days, you feel you are getting worse rather than better.  You need your caregiver's advice about medicines to control symptoms.  You develop chills, worsening shortness of breath, or brown or red sputum. These may be signs of  pneumonia.  You develop yellow or brown nasal discharge or pain in the face, especially when you bend forward. These may be signs of sinusitis.  You develop a fever, swollen neck glands, pain with swallowing, or white areas in the back of your throat. These may be signs of strep throat. SEEK IMMEDIATE MEDICAL CARE IF:   You have a fever.  You develop severe or persistent headache, ear pain, sinus pain, or chest pain.  You develop wheezing, a prolonged cough, cough up blood, or have a change in your usual mucus (if you have chronic lung disease).  You develop sore muscles or a stiff neck. Document Released: 03/13/2001 Document Revised: 12/10/2011 Document Reviewed: 01/19/2011 [facility] Patient Information 2014 [place].  Weakness Weakness is a lack of strength. It may be felt all over the body (generalized) or in one specific part of the body (focal). Some causes of weakness can be serious. You may need further medical evaluation, especially if you are elderly or you have a history of immunosuppression (such as chemotherapy or HIV), kidney disease, heart disease, or diabetes. CAUSES  Weakness can be caused by many different things, including:  Infection.  Physical exhaustion.  Internal bleeding or other blood loss that results in a lack of red blood cells (anemia).  Dehydration. This cause is more common in elderly people.  Side effects or electrolyte abnormalities from medicines, such as pain medicines or sedatives.  Emotional distress, anxiety, or depression.  Circulation problems, especially severe peripheral arterial disease.  Heart disease, such as rapid atrial fibrillation, bradycardia, or heart failure.  Nervous system disorders, such as Guillain-Barr syndrome, multiple sclerosis, or stroke. DIAGNOSIS  To find the cause of your weakness, your caregiver will take your history and perform a physical exam. Lab tests or X-rays may also be ordered, if  needed. TREATMENT  Treatment of weakness depends on the cause of your symptoms and can vary greatly. HOME CARE INSTRUCTIONS   Rest as needed.  Eat a well-balanced diet.  Try to get some exercise every day.  Only take over-the-counter or prescription medicines as directed by your caregiver. SEEK MEDICAL CARE IF:   Your weakness seems to be getting worse or spreads to other parts of your body.  You develop new aches or pains. SEEK IMMEDIATE MEDICAL CARE IF:   You cannot perform your normal daily activities, such as getting dressed and feeding yourself.  You cannot walk up and down stairs, or you feel exhausted when you do so.  You have shortness of breath or chest pain.  You have difficulty moving parts of your body.  You have weakness in only one area of the body or on only one side of the body.  You have a fever.  You have trouble speaking or swallowing.  You cannot control your bladder or bowel movements.  You have black or bloody vomit or stools. MAKE SURE YOU:  Understand these instructions.  Will watch your condition.  Will get help right away if you are not doing well or get worse. Document Released: 09/17/2005 Document Revised: 03/18/2012 Document Reviewed: 11/16/2011 [facility] Patient Information 2014 [place], [place].

## 2013-11-12 NOTE — ED Notes (Signed)
C/o cough with mucous that is causing abdominal pain, chest congestion, sneezing, and tiredness. Only pain when she cough. Took OTC flu/cold medicine with some relief. No n/v/d. Also stated that she would like to get her pancreas checked due to her father passing with pancreatic cancer years ago. Written by: Marga MelnickQuaNeisha Jones, SMA

## 2013-11-12 NOTE — ED Provider Notes (Signed)
Medical screening examination/treatment/procedure(s) were performed by a resident physician or non-physician practitioner and as the supervising physician I was immediately available for consultation/collaboration.  Evan Corey, MD    Evan S Corey, MD 11/12/13 2112 

## 2013-11-13 ENCOUNTER — Ambulatory Visit (INDEPENDENT_AMBULATORY_CARE_PROVIDER_SITE_OTHER): Payer: Medicare Other | Admitting: *Deleted

## 2013-11-13 DIAGNOSIS — Z7901 Long term (current) use of anticoagulants: Secondary | ICD-10-CM

## 2013-11-13 DIAGNOSIS — I2699 Other pulmonary embolism without acute cor pulmonale: Secondary | ICD-10-CM

## 2013-11-13 DIAGNOSIS — Z5181 Encounter for therapeutic drug level monitoring: Secondary | ICD-10-CM

## 2013-11-13 LAB — POCT INR: INR: 1.5

## 2013-12-07 ENCOUNTER — Ambulatory Visit (INDEPENDENT_AMBULATORY_CARE_PROVIDER_SITE_OTHER): Payer: Medicare Other

## 2013-12-07 DIAGNOSIS — Z5181 Encounter for therapeutic drug level monitoring: Secondary | ICD-10-CM

## 2013-12-07 DIAGNOSIS — Z7901 Long term (current) use of anticoagulants: Secondary | ICD-10-CM

## 2013-12-07 DIAGNOSIS — I2699 Other pulmonary embolism without acute cor pulmonale: Secondary | ICD-10-CM

## 2013-12-07 LAB — POCT INR: INR: 5.2

## 2013-12-14 ENCOUNTER — Ambulatory Visit (INDEPENDENT_AMBULATORY_CARE_PROVIDER_SITE_OTHER): Payer: Medicare Other | Admitting: Pharmacist

## 2013-12-14 DIAGNOSIS — Z5181 Encounter for therapeutic drug level monitoring: Secondary | ICD-10-CM

## 2013-12-14 DIAGNOSIS — I2699 Other pulmonary embolism without acute cor pulmonale: Secondary | ICD-10-CM

## 2013-12-14 DIAGNOSIS — Z7901 Long term (current) use of anticoagulants: Secondary | ICD-10-CM

## 2013-12-14 LAB — POCT INR: INR: 1.7

## 2013-12-25 ENCOUNTER — Other Ambulatory Visit: Payer: Self-pay | Admitting: Cardiology

## 2013-12-28 ENCOUNTER — Ambulatory Visit (INDEPENDENT_AMBULATORY_CARE_PROVIDER_SITE_OTHER): Payer: Medicare Other | Admitting: *Deleted

## 2013-12-28 DIAGNOSIS — I2699 Other pulmonary embolism without acute cor pulmonale: Secondary | ICD-10-CM

## 2013-12-28 DIAGNOSIS — Z5181 Encounter for therapeutic drug level monitoring: Secondary | ICD-10-CM

## 2013-12-28 DIAGNOSIS — Z7901 Long term (current) use of anticoagulants: Secondary | ICD-10-CM

## 2013-12-28 LAB — POCT INR: INR: 2.7

## 2014-01-21 ENCOUNTER — Ambulatory Visit (INDEPENDENT_AMBULATORY_CARE_PROVIDER_SITE_OTHER): Payer: Medicare Other

## 2014-01-21 DIAGNOSIS — I2699 Other pulmonary embolism without acute cor pulmonale: Secondary | ICD-10-CM

## 2014-01-21 DIAGNOSIS — Z5181 Encounter for therapeutic drug level monitoring: Secondary | ICD-10-CM

## 2014-01-21 DIAGNOSIS — Z7901 Long term (current) use of anticoagulants: Secondary | ICD-10-CM

## 2014-01-21 LAB — POCT INR: INR: 2.9

## 2014-02-25 ENCOUNTER — Ambulatory Visit (INDEPENDENT_AMBULATORY_CARE_PROVIDER_SITE_OTHER): Payer: Medicare Other | Admitting: Cardiology

## 2014-02-25 ENCOUNTER — Encounter: Payer: Self-pay | Admitting: Cardiology

## 2014-02-25 VITALS — BP 87/50 | HR 79 | Ht 64.0 in | Wt 145.0 lb

## 2014-02-25 DIAGNOSIS — I2699 Other pulmonary embolism without acute cor pulmonale: Secondary | ICD-10-CM

## 2014-02-25 DIAGNOSIS — I2510 Atherosclerotic heart disease of native coronary artery without angina pectoris: Secondary | ICD-10-CM

## 2014-02-25 NOTE — Patient Instructions (Signed)
Your physician wants you to follow-up in: 1 year with Dr McLean. (May 2016). You will receive a reminder letter in the mail two months in advance. If you don't receive a letter, please call our office to schedule the follow-up appointment.  

## 2014-02-26 NOTE — Progress Notes (Signed)
Patient ID: Erin Roberson, female   DOB: January 08, 1924, 80 y.o.   MRN: [id number] PCP: Dr. Petra Kuba  81 yo with history of CAD and idiopathic PE presents for cardiology followup.  She is on chronic coumadin.  Last cath in 2011 showed nonobstructive CAD. She has been doing well overall.  Main complaint is sharp chest pain that she will get on occasion.  It extends from her right arm across her right chest over to her left chest and lasts for a couple of seconds, then is gone.  It is not exertional.  She has an episode about once a month chronically.  No DOE.  BP runs chronically low but she is not very symptomatic from this.  She is occasionally mildly lightheaded when she stands up first thing in the morning when she wakes up, otherwise no lightheadedness.    ECG: NSR, nonspecific anterolateral T wave flattening.   Labs (3/14): K 3.6, creatinine 0.9, HCT 40.1 Labs (9/14): LDL 85, HDL 58 Labs (2/15): K 4.2, creatinine 1.18  PMH: 1. History of PE: Idiopathic, on chronic coumadin because no trigger for PE identified . 2. Hyperlipidemia 3. Diverticulosis 4. H/o UTI 5. H/o uterine cancer 6. Dementia 7. CAD: PTCA LAD 1992.  Last cath in 10/11 with nonobstructive disease, EF 60% by LV-gram.  Echo (10/11) with EF 55-60%, PA systolic pressure 36 mmHg.   SH: Divorced, lives alone in [place], no children, nonsmoker.   FH: Father with pancreatic cancer.   Current Outpatient Prescriptions  Medication Sig Dispense Refill  . atorvastatin (LIPITOR) 80 MG tablet Take 80 mg by mouth daily at 6 PM. 1/2 tablet (total 40mg ) daily      . calcium-vitamin D (OSCAL WITH D) 500-200 MG-UNIT per tablet Take 1 tablet by mouth as needed.       . donepezil (ARICEPT) 5 MG tablet Take 5 mg by mouth at bedtime.       Marland Kitchen latanoprost (XALATAN) 0.005 % ophthalmic solution Place 1 drop into both eyes at bedtime.       Marland Kitchen OVER THE COUNTER MEDICATION Take 8 oz by mouth 3 (three) times daily. PRUNE JUICE      . warfarin  (COUMADIN) 5 MG tablet TAKE AS DIRECTED BY COUMADIN CLINIC  40 tablet  3   No current facility-administered medications for this visit.    BP 87/50  Pulse 79  Ht 5\' 4"  (1.626 m)  Wt 65.772 kg (145 lb)  BMI 24.88 kg/m2 General: NAD Neck: No JVD, no thyromegaly or thyroid nodule.  Lungs: Clear to auscultation bilaterally with normal respiratory effort. CV: Nondisplaced PMI.  Heart regular S1/S2, no S3/S4, no murmur.  No peripheral edema.  No carotid bruit.  Normal pedal pulses.  Abdomen: Soft, nontender, no hepatosplenomegaly, no distention.  Neurologic: Alert and oriented x 3.  Psych: Normal affect. Extremities: No clubbing or cyanosis.   Assessment/Plan:  1. PE: No trigger for PE.  She is on chronic coumadin with no problems.  2. CAD: Stable.  She has atypical chest pain that is probably noncardiac.  She is not on ASA due to stable CAD with coumadin use.  She is on a statin.  3. Hyperlipidemia: Good lipids 9/14.   4. Hypotension: Mildly low BP, not symptomatic.  She is not on any BP-active medications.   Followup 1 year    Laurey Morale 02/26/2014 9:38 PM

## 2014-06-23 ENCOUNTER — Ambulatory Visit (INDEPENDENT_AMBULATORY_CARE_PROVIDER_SITE_OTHER): Payer: Medicare Other | Admitting: Pharmacist

## 2014-06-23 DIAGNOSIS — Z7901 Long term (current) use of anticoagulants: Secondary | ICD-10-CM

## 2014-06-23 DIAGNOSIS — Z5181 Encounter for therapeutic drug level monitoring: Secondary | ICD-10-CM

## 2014-06-23 DIAGNOSIS — I2699 Other pulmonary embolism without acute cor pulmonale: Secondary | ICD-10-CM

## 2014-06-23 LAB — POCT INR: INR: 4.3

## 2014-07-01 ENCOUNTER — Other Ambulatory Visit: Payer: Self-pay | Admitting: Cardiology

## 2014-07-08 ENCOUNTER — Ambulatory Visit (INDEPENDENT_AMBULATORY_CARE_PROVIDER_SITE_OTHER): Payer: Medicare Other | Admitting: *Deleted

## 2014-07-08 DIAGNOSIS — Z7901 Long term (current) use of anticoagulants: Secondary | ICD-10-CM

## 2014-07-08 DIAGNOSIS — Z5181 Encounter for therapeutic drug level monitoring: Secondary | ICD-10-CM

## 2014-07-08 DIAGNOSIS — I2699 Other pulmonary embolism without acute cor pulmonale: Secondary | ICD-10-CM

## 2014-07-08 LAB — POCT INR: INR: 5.3

## 2014-07-15 ENCOUNTER — Ambulatory Visit (INDEPENDENT_AMBULATORY_CARE_PROVIDER_SITE_OTHER): Payer: Medicare Other | Admitting: Pharmacist

## 2014-07-15 DIAGNOSIS — Z5181 Encounter for therapeutic drug level monitoring: Secondary | ICD-10-CM

## 2014-07-15 DIAGNOSIS — I2699 Other pulmonary embolism without acute cor pulmonale: Secondary | ICD-10-CM

## 2014-07-15 DIAGNOSIS — Z7901 Long term (current) use of anticoagulants: Secondary | ICD-10-CM

## 2014-07-15 LAB — POCT INR: INR: 1.4

## 2014-08-04 ENCOUNTER — Ambulatory Visit (INDEPENDENT_AMBULATORY_CARE_PROVIDER_SITE_OTHER): Payer: Medicare Other | Admitting: *Deleted

## 2014-08-04 DIAGNOSIS — I2699 Other pulmonary embolism without acute cor pulmonale: Secondary | ICD-10-CM

## 2014-08-04 DIAGNOSIS — Z7901 Long term (current) use of anticoagulants: Secondary | ICD-10-CM

## 2014-08-04 DIAGNOSIS — Z5181 Encounter for therapeutic drug level monitoring: Secondary | ICD-10-CM

## 2014-08-04 LAB — POCT INR: INR: 1.7

## 2014-08-10 ENCOUNTER — Other Ambulatory Visit: Payer: Self-pay | Admitting: Cardiology

## 2014-08-18 ENCOUNTER — Ambulatory Visit (INDEPENDENT_AMBULATORY_CARE_PROVIDER_SITE_OTHER): Payer: Medicare Other | Admitting: Pharmacist

## 2014-08-18 DIAGNOSIS — Z5181 Encounter for therapeutic drug level monitoring: Secondary | ICD-10-CM

## 2014-08-18 DIAGNOSIS — Z7901 Long term (current) use of anticoagulants: Secondary | ICD-10-CM

## 2014-08-18 DIAGNOSIS — I2699 Other pulmonary embolism without acute cor pulmonale: Secondary | ICD-10-CM

## 2014-08-18 LAB — POCT INR: INR: 3.7

## 2014-09-01 ENCOUNTER — Ambulatory Visit (INDEPENDENT_AMBULATORY_CARE_PROVIDER_SITE_OTHER): Payer: Medicare Other | Admitting: *Deleted

## 2014-09-01 DIAGNOSIS — I2699 Other pulmonary embolism without acute cor pulmonale: Secondary | ICD-10-CM

## 2014-09-01 DIAGNOSIS — Z5181 Encounter for therapeutic drug level monitoring: Secondary | ICD-10-CM

## 2014-09-01 DIAGNOSIS — Z7901 Long term (current) use of anticoagulants: Secondary | ICD-10-CM

## 2014-09-01 LAB — POCT INR: INR: 1.2

## 2014-09-10 ENCOUNTER — Ambulatory Visit (INDEPENDENT_AMBULATORY_CARE_PROVIDER_SITE_OTHER): Payer: Medicare Other | Admitting: *Deleted

## 2014-09-10 ENCOUNTER — Other Ambulatory Visit: Payer: Self-pay | Admitting: Cardiology

## 2014-09-10 DIAGNOSIS — Z5181 Encounter for therapeutic drug level monitoring: Secondary | ICD-10-CM

## 2014-09-10 DIAGNOSIS — Z7901 Long term (current) use of anticoagulants: Secondary | ICD-10-CM

## 2014-09-10 DIAGNOSIS — I2699 Other pulmonary embolism without acute cor pulmonale: Secondary | ICD-10-CM

## 2014-09-10 LAB — POCT INR: INR: 2.5

## 2014-09-28 ENCOUNTER — Ambulatory Visit (INDEPENDENT_AMBULATORY_CARE_PROVIDER_SITE_OTHER): Payer: Medicare Other | Admitting: *Deleted

## 2014-09-28 DIAGNOSIS — I2699 Other pulmonary embolism without acute cor pulmonale: Secondary | ICD-10-CM

## 2014-09-28 DIAGNOSIS — Z7901 Long term (current) use of anticoagulants: Secondary | ICD-10-CM

## 2014-09-28 DIAGNOSIS — Z5181 Encounter for therapeutic drug level monitoring: Secondary | ICD-10-CM

## 2014-09-28 LAB — POCT INR: INR: 2

## 2014-10-20 ENCOUNTER — Ambulatory Visit (INDEPENDENT_AMBULATORY_CARE_PROVIDER_SITE_OTHER): Payer: Medicare Other | Admitting: *Deleted

## 2014-10-20 DIAGNOSIS — I2699 Other pulmonary embolism without acute cor pulmonale: Secondary | ICD-10-CM

## 2014-10-20 DIAGNOSIS — Z5181 Encounter for therapeutic drug level monitoring: Secondary | ICD-10-CM

## 2014-10-20 DIAGNOSIS — Z7901 Long term (current) use of anticoagulants: Secondary | ICD-10-CM

## 2014-10-20 LAB — POCT INR: INR: 4.6

## 2014-11-05 ENCOUNTER — Ambulatory Visit (INDEPENDENT_AMBULATORY_CARE_PROVIDER_SITE_OTHER): Payer: Medicare Other | Admitting: *Deleted

## 2014-11-05 DIAGNOSIS — Z7901 Long term (current) use of anticoagulants: Secondary | ICD-10-CM

## 2014-11-05 DIAGNOSIS — I2699 Other pulmonary embolism without acute cor pulmonale: Secondary | ICD-10-CM

## 2014-11-05 DIAGNOSIS — Z5181 Encounter for therapeutic drug level monitoring: Secondary | ICD-10-CM

## 2014-11-05 LAB — POCT INR: INR: 2.9

## 2014-11-29 ENCOUNTER — Ambulatory Visit (INDEPENDENT_AMBULATORY_CARE_PROVIDER_SITE_OTHER): Payer: Medicare Other | Admitting: *Deleted

## 2014-11-29 DIAGNOSIS — Z5181 Encounter for therapeutic drug level monitoring: Secondary | ICD-10-CM

## 2014-11-29 DIAGNOSIS — Z7901 Long term (current) use of anticoagulants: Secondary | ICD-10-CM

## 2014-11-29 DIAGNOSIS — I2699 Other pulmonary embolism without acute cor pulmonale: Secondary | ICD-10-CM

## 2014-11-29 LAB — POCT INR: INR: 3.3

## 2014-12-29 ENCOUNTER — Ambulatory Visit (INDEPENDENT_AMBULATORY_CARE_PROVIDER_SITE_OTHER): Payer: Medicare Other | Admitting: *Deleted

## 2014-12-29 DIAGNOSIS — I2699 Other pulmonary embolism without acute cor pulmonale: Secondary | ICD-10-CM

## 2014-12-29 DIAGNOSIS — Z5181 Encounter for therapeutic drug level monitoring: Secondary | ICD-10-CM

## 2014-12-29 DIAGNOSIS — Z7901 Long term (current) use of anticoagulants: Secondary | ICD-10-CM | POA: Diagnosis not present

## 2014-12-29 LAB — POCT INR: INR: 2.8

## 2015-01-11 ENCOUNTER — Other Ambulatory Visit: Payer: Self-pay | Admitting: Cardiology

## 2015-01-26 ENCOUNTER — Ambulatory Visit (INDEPENDENT_AMBULATORY_CARE_PROVIDER_SITE_OTHER): Payer: Medicare Other | Admitting: *Deleted

## 2015-01-26 DIAGNOSIS — Z5181 Encounter for therapeutic drug level monitoring: Secondary | ICD-10-CM | POA: Diagnosis not present

## 2015-01-26 DIAGNOSIS — I2699 Other pulmonary embolism without acute cor pulmonale: Secondary | ICD-10-CM

## 2015-01-26 DIAGNOSIS — Z7901 Long term (current) use of anticoagulants: Secondary | ICD-10-CM

## 2015-01-26 LAB — POCT INR: INR: 1.9

## 2015-02-09 ENCOUNTER — Ambulatory Visit (INDEPENDENT_AMBULATORY_CARE_PROVIDER_SITE_OTHER): Payer: Medicare Other | Admitting: *Deleted

## 2015-02-09 DIAGNOSIS — Z7901 Long term (current) use of anticoagulants: Secondary | ICD-10-CM

## 2015-02-09 DIAGNOSIS — Z5181 Encounter for therapeutic drug level monitoring: Secondary | ICD-10-CM

## 2015-02-09 DIAGNOSIS — I2699 Other pulmonary embolism without acute cor pulmonale: Secondary | ICD-10-CM

## 2015-02-09 LAB — POCT INR: INR: 2.7

## 2015-02-25 ENCOUNTER — Ambulatory Visit: Payer: Medicare Other | Admitting: Cardiology

## 2015-03-10 ENCOUNTER — Ambulatory Visit (INDEPENDENT_AMBULATORY_CARE_PROVIDER_SITE_OTHER): Payer: Medicare Other | Admitting: *Deleted

## 2015-03-10 DIAGNOSIS — I2699 Other pulmonary embolism without acute cor pulmonale: Secondary | ICD-10-CM | POA: Diagnosis not present

## 2015-03-10 DIAGNOSIS — Z7901 Long term (current) use of anticoagulants: Secondary | ICD-10-CM

## 2015-03-10 DIAGNOSIS — Z5181 Encounter for therapeutic drug level monitoring: Secondary | ICD-10-CM

## 2015-03-10 LAB — POCT INR: INR: 2.8

## 2015-04-06 ENCOUNTER — Telehealth: Payer: Self-pay | Admitting: *Deleted

## 2015-04-06 NOTE — Telephone Encounter (Signed)
Patient called to cancel her Anticoagulation appointment for today and states she will call when she returns to town to reschedule.  Advised the importance of following up once she returns and she verbalized understanding.

## 2015-04-15 ENCOUNTER — Ambulatory Visit (INDEPENDENT_AMBULATORY_CARE_PROVIDER_SITE_OTHER): Payer: Medicare Other | Admitting: *Deleted

## 2015-04-15 DIAGNOSIS — Z7901 Long term (current) use of anticoagulants: Secondary | ICD-10-CM

## 2015-04-15 DIAGNOSIS — I2699 Other pulmonary embolism without acute cor pulmonale: Secondary | ICD-10-CM

## 2015-04-15 DIAGNOSIS — Z5181 Encounter for therapeutic drug level monitoring: Secondary | ICD-10-CM

## 2015-04-15 LAB — POCT INR: INR: 2.4

## 2015-05-27 ENCOUNTER — Ambulatory Visit (INDEPENDENT_AMBULATORY_CARE_PROVIDER_SITE_OTHER): Payer: Medicare Other | Admitting: Pharmacist

## 2015-05-27 DIAGNOSIS — Z7901 Long term (current) use of anticoagulants: Secondary | ICD-10-CM

## 2015-05-27 DIAGNOSIS — Z5181 Encounter for therapeutic drug level monitoring: Secondary | ICD-10-CM | POA: Diagnosis not present

## 2015-05-27 DIAGNOSIS — I2699 Other pulmonary embolism without acute cor pulmonale: Secondary | ICD-10-CM | POA: Diagnosis not present

## 2015-05-27 LAB — POCT INR: INR: 1.6

## 2015-06-01 ENCOUNTER — Ambulatory Visit (INDEPENDENT_AMBULATORY_CARE_PROVIDER_SITE_OTHER): Payer: Medicare Other | Admitting: Cardiology

## 2015-06-01 VITALS — BP 108/56 | HR 76 | Ht 66.0 in | Wt 143.8 lb

## 2015-06-01 DIAGNOSIS — R1031 Right lower quadrant pain: Secondary | ICD-10-CM | POA: Diagnosis not present

## 2015-06-01 DIAGNOSIS — I2510 Atherosclerotic heart disease of native coronary artery without angina pectoris: Secondary | ICD-10-CM | POA: Diagnosis not present

## 2015-06-01 DIAGNOSIS — R1032 Left lower quadrant pain: Secondary | ICD-10-CM

## 2015-06-01 DIAGNOSIS — G8929 Other chronic pain: Secondary | ICD-10-CM | POA: Diagnosis not present

## 2015-06-01 NOTE — Patient Instructions (Signed)
Medication Instructions:  No changes today  Labwork: BMET/Lipid profile/CBCd today.  Your physician recommends that you return for lab work in: 6 months--CBCd This is scheduled for February 28,2017-the lab is open from 7:30AM-5PM.   Testing/Procedures: None today  Follow-Up: Your physician wants you to follow-up in: 1 year with Dr Shirlee Latch. (August 2017). You will receive a reminder letter in the mail two months in advance. If you don't receive a letter, please call our office to schedule the follow-up appointment.

## 2015-06-02 ENCOUNTER — Encounter: Payer: Self-pay | Admitting: Cardiology

## 2015-06-02 LAB — CBC WITH DIFFERENTIAL/PLATELET
Basophils Absolute: 0 10*3/uL (ref 0.0–0.1)
Basophils Relative: 0.9 % (ref 0.0–3.0)
Eosinophils Absolute: 0.1 10*3/uL (ref 0.0–0.7)
Eosinophils Relative: 3.4 % (ref 0.0–5.0)
HEMATOCRIT: 36.6 % (ref 36.0–46.0)
HEMOGLOBIN: 12.3 g/dL (ref 12.0–15.0)
Lymphocytes Relative: 44.4 % (ref 12.0–46.0)
Lymphs Abs: 1.7 10*3/uL (ref 0.7–4.0)
MCHC: 33.7 g/dL (ref 30.0–36.0)
MCV: 94.6 fl (ref 78.0–100.0)
MONOS PCT: 5.3 % (ref 3.0–12.0)
Monocytes Absolute: 0.2 10*3/uL (ref 0.1–1.0)
NEUTROS ABS: 1.7 10*3/uL (ref 1.4–7.7)
Neutrophils Relative %: 46 % (ref 43.0–77.0)
PLATELETS: 194 10*3/uL (ref 150.0–400.0)
RBC: 3.87 Mil/uL (ref 3.87–5.11)
RDW: 14 % (ref 11.5–15.5)
WBC: 3.8 10*3/uL — AB (ref 4.0–10.5)

## 2015-06-02 LAB — LIPID PANEL
CHOL/HDL RATIO: 3
Cholesterol: 170 mg/dL (ref 0–200)
HDL: 52.4 mg/dL (ref >39.00)
LDL Cholesterol: 96 mg/dL (ref 0–99)
NONHDL: 117.22
TRIGLYCERIDES: 108 mg/dL (ref 0.0–149.0)
VLDL: 21.6 mg/dL (ref 0.0–40.0)

## 2015-06-02 LAB — BASIC METABOLIC PANEL
BUN: 17 mg/dL (ref 6–23)
CALCIUM: 8.5 mg/dL (ref 8.4–10.5)
CHLORIDE: 110 meq/L (ref 96–112)
CO2: 27 mEq/L (ref 19–32)
CREATININE: 0.98 mg/dL (ref 0.40–1.20)
GFR: 68.64 mL/min (ref >60.00)
Glucose, Bld: 84 mg/dL (ref 70–99)
Potassium: 4.2 mEq/L (ref 3.5–5.1)
Sodium: 143 mEq/L (ref 135–145)

## 2015-06-02 NOTE — Progress Notes (Signed)
Patient ID: Erin Roberson, female   DOB: [birth year], 81 y.o.   MRN: [id number] PCP: Dr. Petra Kuba  81 yo with history of CAD and idiopathic PE presents for cardiology followup.  She is on chronic coumadin.  Last cath in 2011 showed nonobstructive CAD. She has been doing well overall.  Main complaint is sharp chest pain that she will get on occasion.  It extends from her right arm across her right chest over to her left chest and lasts for a couple of seconds, then is gone.  It is not exertional.  She had a bad episode of this 2-3 weeks ago.  No DOE.  No lightheadedness or falls.  No BRBPR or melena on warfarin.  She lives alone and does all her ADLs.   ECG: NSR, nonspecific anterolateral T wave flattening.   Labs (3/14): K 3.6, creatinine 0.9, HCT 40.1 Labs (9/14): LDL 85, HDL 58 Labs (2/15): K 4.2, creatinine 1.18  PMH: 1. History of PE: Idiopathic, on chronic coumadin because no trigger for PE identified . 2. Hyperlipidemia 3. Diverticulosis 4. H/o UTI 5. H/o uterine cancer 6. Dementia 7. CAD: PTCA LAD 1992.  Last cath in 10/11 with nonobstructive disease, EF 60% by LV-gram.  Echo (10/11) with EF 55-60%, PA systolic pressure 36 mmHg.   SH: Divorced, lives alone in [place], no children, nonsmoker.   FH: Father with pancreatic cancer.   Current Outpatient Prescriptions  Medication Sig Dispense Refill  . atorvastatin (LIPITOR) 80 MG tablet Take 80 mg by mouth daily at 6 PM. 1/2 tablet (total ) daily    . calcium-vitamin D (OSCAL WITH D) 500-200 MG-UNIT per tablet Take 1 tablet by mouth as needed.     . donepezil (ARICEPT) 5 MG tablet Take 5 mg by mouth at bedtime.     Marland Kitchen latanoprost (XALATAN) 0.005 % ophthalmic solution Place 1 drop into both eyes at bedtime.     Marland Kitchen OVER THE COUNTER MEDICATION Take 8 oz by mouth 3 (three) times daily. PRUNE JUICE    . warfarin (COUMADIN) 5 MG tablet TAKE AS DIRECTED BY COUMADIN CLINIC 40 tablet 3   No current facility-administered medications for  this visit.    BP 108/56 mmHg  Pulse 76  Ht  (1.676 m)  Wt 143 lb 12.8 oz (65.227 kg)  BMI 23.22 kg/m2 General: NAD Neck: No JVD, no thyromegaly or thyroid nodule.  Lungs: Clear to auscultation bilaterally with normal respiratory effort. CV: Nondisplaced PMI.  Heart regular S1/S2, no S3/S4, no murmur.  No peripheral edema.  No carotid bruit.  Normal pedal pulses.  Abdomen: Soft, nontender, no hepatosplenomegaly, no distention.  Neurologic: Alert and oriented x 3.  Psych: Normal affect. Extremities: No clubbing or cyanosis.   Assessment/Plan:  1. PE: No trigger for PE.  She is on chronic coumadin with no problems.  Check CBC today.  2. CAD: Stable.  She has atypical chest pain that is probably noncardiac.  She is not on ASA due to stable CAD with coumadin use.  She is on a statin.  3. Hyperlipidemia: Check lipids today.      Repeat CBC in 6months and followup in 1 year.    Marca Ancona 06/02/2015 5:04 PM

## 2015-06-17 ENCOUNTER — Ambulatory Visit (INDEPENDENT_AMBULATORY_CARE_PROVIDER_SITE_OTHER): Payer: Medicare Other | Admitting: Pharmacist

## 2015-06-17 DIAGNOSIS — I2699 Other pulmonary embolism without acute cor pulmonale: Secondary | ICD-10-CM | POA: Diagnosis not present

## 2015-06-17 DIAGNOSIS — Z5181 Encounter for therapeutic drug level monitoring: Secondary | ICD-10-CM | POA: Diagnosis not present

## 2015-06-17 DIAGNOSIS — Z7901 Long term (current) use of anticoagulants: Secondary | ICD-10-CM | POA: Diagnosis not present

## 2015-06-17 LAB — POCT INR: INR: 2.9

## 2015-07-27 ENCOUNTER — Ambulatory Visit (INDEPENDENT_AMBULATORY_CARE_PROVIDER_SITE_OTHER): Payer: Medicare Other | Admitting: *Deleted

## 2015-07-27 DIAGNOSIS — Z7901 Long term (current) use of anticoagulants: Secondary | ICD-10-CM | POA: Diagnosis not present

## 2015-07-27 DIAGNOSIS — I2699 Other pulmonary embolism without acute cor pulmonale: Secondary | ICD-10-CM

## 2015-07-27 DIAGNOSIS — Z5181 Encounter for therapeutic drug level monitoring: Secondary | ICD-10-CM

## 2015-07-27 LAB — POCT INR: INR: 1.6

## 2015-08-03 ENCOUNTER — Other Ambulatory Visit: Payer: Self-pay | Admitting: Cardiology

## 2015-08-10 ENCOUNTER — Ambulatory Visit (INDEPENDENT_AMBULATORY_CARE_PROVIDER_SITE_OTHER): Payer: Medicare Other | Admitting: Pharmacist

## 2015-08-10 DIAGNOSIS — Z7901 Long term (current) use of anticoagulants: Secondary | ICD-10-CM

## 2015-08-10 DIAGNOSIS — Z5181 Encounter for therapeutic drug level monitoring: Secondary | ICD-10-CM

## 2015-08-10 DIAGNOSIS — I2699 Other pulmonary embolism without acute cor pulmonale: Secondary | ICD-10-CM | POA: Diagnosis not present

## 2015-08-10 LAB — POCT INR: INR: 3.5

## 2015-08-29 ENCOUNTER — Ambulatory Visit (INDEPENDENT_AMBULATORY_CARE_PROVIDER_SITE_OTHER): Payer: Medicare Other | Admitting: Pharmacist

## 2015-08-29 DIAGNOSIS — Z7901 Long term (current) use of anticoagulants: Secondary | ICD-10-CM | POA: Diagnosis not present

## 2015-08-29 DIAGNOSIS — I2699 Other pulmonary embolism without acute cor pulmonale: Secondary | ICD-10-CM | POA: Diagnosis not present

## 2015-08-29 DIAGNOSIS — Z5181 Encounter for therapeutic drug level monitoring: Secondary | ICD-10-CM | POA: Diagnosis not present

## 2015-08-29 LAB — POCT INR: INR: 1.8

## 2015-09-19 ENCOUNTER — Ambulatory Visit (INDEPENDENT_AMBULATORY_CARE_PROVIDER_SITE_OTHER): Payer: Medicare Other

## 2015-09-19 DIAGNOSIS — Z7901 Long term (current) use of anticoagulants: Secondary | ICD-10-CM

## 2015-09-19 DIAGNOSIS — I2699 Other pulmonary embolism without acute cor pulmonale: Secondary | ICD-10-CM

## 2015-09-19 DIAGNOSIS — Z5181 Encounter for therapeutic drug level monitoring: Secondary | ICD-10-CM

## 2015-09-19 LAB — POCT INR: INR: 3.8

## 2015-10-18 ENCOUNTER — Ambulatory Visit (INDEPENDENT_AMBULATORY_CARE_PROVIDER_SITE_OTHER): Payer: Medicare Other | Admitting: *Deleted

## 2015-10-18 DIAGNOSIS — Z7901 Long term (current) use of anticoagulants: Secondary | ICD-10-CM

## 2015-10-18 DIAGNOSIS — I2699 Other pulmonary embolism without acute cor pulmonale: Secondary | ICD-10-CM | POA: Diagnosis not present

## 2015-10-18 DIAGNOSIS — Z5181 Encounter for therapeutic drug level monitoring: Secondary | ICD-10-CM | POA: Diagnosis not present

## 2015-10-18 LAB — POCT INR: INR: 3.2

## 2015-11-08 ENCOUNTER — Encounter: Payer: Self-pay | Admitting: Physician Assistant

## 2015-11-08 ENCOUNTER — Ambulatory Visit (INDEPENDENT_AMBULATORY_CARE_PROVIDER_SITE_OTHER): Payer: Medicare Other | Admitting: Physician Assistant

## 2015-11-08 ENCOUNTER — Ambulatory Visit (INDEPENDENT_AMBULATORY_CARE_PROVIDER_SITE_OTHER): Payer: Medicare Other | Admitting: *Deleted

## 2015-11-08 VITALS — BP 110/60 | HR 80 | Ht 66.0 in | Wt 139.0 lb

## 2015-11-08 DIAGNOSIS — R079 Chest pain, unspecified: Secondary | ICD-10-CM

## 2015-11-08 DIAGNOSIS — E785 Hyperlipidemia, unspecified: Secondary | ICD-10-CM | POA: Diagnosis not present

## 2015-11-08 DIAGNOSIS — I2699 Other pulmonary embolism without acute cor pulmonale: Secondary | ICD-10-CM | POA: Diagnosis not present

## 2015-11-08 DIAGNOSIS — Z7901 Long term (current) use of anticoagulants: Secondary | ICD-10-CM | POA: Diagnosis not present

## 2015-11-08 DIAGNOSIS — Z5181 Encounter for therapeutic drug level monitoring: Secondary | ICD-10-CM

## 2015-11-08 LAB — POCT INR: INR: 1.8

## 2015-11-08 NOTE — Progress Notes (Signed)
Cardiology Office Note   Date:  11/08/2015   ID:  Erin Roberson, DOB 26-Dec-1923, MRN [id number]  PCP:  Eino Farber, MD  Cardiologist:  Dr Elliot Gurney, PA-C   History of Present Illness: Erin Roberson is a 81 y.o. female with a history of PTCA to the LAD in 1992, idiopathic PE 2011 on coumadin, non-obs CAD at cath 2011, nl EF, HLD. She has a history of atypical sharp chest pain that only lasts seconds.  Erin Roberson presents for evaluation of chest pain. She has occasional sharp chest pains that only lasts seconds and are not exertional. She was getting them about one time per day but has had none in the last 2 or 3 months.   About 3 weeks ago, she woke with left chest pressure. It was about a 5/10. There were no associated symptoms. She did not take any medications for it. She does not remember it being worse with deep inspiration. It was not positional. The pain lasted less than 5 minutes and then resolved. She has not had it since. She was concerned about the pain and that is why she is here.  She admits that she tends to eat a snack in the evening, crackers or cookies with a soda.  She has otherwise been doing very well, no bleeding issues, no problems with her medications, and no breathing problems. She is active around the house and yard and will rake leaves, vacuum, or mop, without difficulty. She does exercise classes but doesn't think the instructor works them very hard because they are for old people. She denies dyspnea on exertion, lower extremity edema, orthopnea or PND. She never has exertional chest pain. Her weight has not changed. She has some soreness in her lower legs but there is no edema, and they are not cold or numb.   Past Medical History  Diagnosis Date  . Other pulmonary embolism and infarction     2011  . Pure hypercholesterolemia   . Overweight(278.02)   . Depression   . Unspecified cardiovascular disease   . Iron deficiency anemia,  unspecified   . Hemorrhage of rectum and anus   . Leukocytopenia, unspecified   . Urinary hesitancy   . Diverticulosis of colon (without mention of hemorrhage)   . Coronary atherosclerosis   . Glaucoma   . Fibromyalgia   . UTI (lower urinary tract infection)   . History of uterine cancer   . Clotting disorder (HCC)   . Thyroid disease   . Dementia   . Breast mass     left breast  . Leukopenia     chronic--benign  . Arthritis     Past Surgical History  Procedure Laterality Date  . Cataract extraction  2004    right  . Cardiac catheterization  07/25/10    luminal irregularities in multiple vessels, no sig stenosis, EF 60%  . Abdominal hysterectomy    . Coronary angioplasty  1992    LAD  . Colonoscopy  07/01/2007    diverticulosis  . Upper gastrointestinal endoscopy  07/16/2007    tortous esophagus with spasms  . Thyroid needle aspiration  12/2007    right, hyperplastic nodule  . Kidney surgery      stent placed  . Neck surgery      Current Outpatient Prescriptions  Medication Sig Dispense Refill  . atorvastatin (LIPITOR) 80 MG tablet Take 80 mg by mouth daily at 6 PM. 1/2 tablet (total )  daily ON TUESDAYS /SATURDAY    . calcium-vitamin D (OSCAL WITH D) 500-200 MG-UNIT per tablet Take 1 tablet by mouth as needed.     . donepezil (ARICEPT) 5 MG tablet Take 5 mg by mouth at bedtime.     Marland Kitchen latanoprost (XALATAN) 0.005 % ophthalmic solution Place 1 drop into both eyes at bedtime.     Marland Kitchen OVER THE COUNTER MEDICATION Take 8 oz by mouth 3 (three) times daily. PRUNE JUICE    . warfarin (COUMADIN) 5 MG tablet TAKE AS DIRECTED BY COUMADIN CLINIC 40 tablet 3   No current facility-administered medications for this visit.    Allergies:   Review of patient's allergies indicates no known allergies.    Social History:  The patient  reports that she has never smoked. She has never used smokeless tobacco. She reports that she does not drink alcohol or use illicit drugs.   Family  History:  The patient's family history includes Alcohol abuse in her brother; Diabetes in her mother; Emphysema in her brother; Hypertension in her mother; Pancreatic cancer in her father; Stroke in her maternal aunt. There is no history of Colon cancer or Heart attack.    ROS:  Please see the history of present illness. All other systems are reviewed and negative.    PHYSICAL EXAM: VS:  BP 110/60 mmHg  Pulse 80  Ht 5\' 6"  (1.676 m)  Wt 139 lb (63.05 kg)  BMI 22.45 kg/m2 , BMI Body mass index is 22.45 kg/(m^2). GEN: Well nourished, well developed, female in no acute distress HEENT: normal for age  Neck: no JVD, no carotid bruit, no masses Cardiac: RRR; soft murmur, no rubs, or gallops Respiratory:  clear to auscultation bilaterally, normal work of breathing GI: soft, nontender, nondistended, + BS MS: no deformity or atrophy; no edema; distal pulses are 2+ in all 4 extremities; both legs are mildly tender to palpation Skin: warm and dry, no rash Neuro:  Strength and sensation are intact Psych: euthymic mood, full affect   EKG:  EKG is ordered today. The ekg ordered today demonstrates diffuse T-wave flattening and lateral T-wave inversions, unchanged from August 2016   Recent Labs: 06/01/2015: BUN 17; Creatinine, Ser 0.98; Hemoglobin 12.3; Platelets 194.0; Potassium 4.2; Sodium 143    Lipid Panel    Component Value Date/Time   CHOL 170 06/01/2015 1636   TRIG 108.0 06/01/2015 1636   HDL 52.40 06/01/2015 1636   CHOLHDL 3 06/01/2015 1636   VLDL 21.6 06/01/2015 1636   LDLCALC 96 06/01/2015 1636   LDLDIRECT 138.4 02/13/2013 1307     Wt Readings from Last 3 Encounters:  11/08/15 139 lb (63.05 kg)  06/01/15 143 lb 12.8 oz (65.227 kg)  02/25/14 145 lb (65.772 kg)     Other studies Reviewed: Additional studies/ records that were reviewed today include: Office Notes and other records.  ASSESSMENT AND PLAN:  1.  Chest pain: She has a history of coronary artery disease, her  last PCI was 25 years ago. However, she does not routinely have exertional symptoms. She admits that she had had cookies and soda before bedtime that day and her symptoms are possibly GI in origin.  We discussed that she has multiple cardiac risk factors including a history of CAD, but she does not have exertional symptoms and her activity level is very good. Her ECG is unchanged. I told her that I did not feel strongly about ordering a stress test but I would do one if she was concerned.  Her blood pressure and heart rate are well controlled. She is compliant with her medications.   We will order a Lexi scan Myoview and follow-up on the results.  She is in agreement with this as a plan of care. As long as the stress test is normal, she is to continue current medical therapy and follow-up with Dr. Shirlee Latch in 6 months, with lab work at that time.  2. Chronic anticoagulation:  She will get a Coumadin check today.  3. Hyperlipidemia: She will get a complete metabolic panel and a lipid profile prior to seeing Dr. Shirlee Latch in 6 months   Current medicines are reviewed at length with the patient today.  The patient does not have concerns regarding medicines.  The following changes have been made:  no change  Labs/ tests ordered today include:   Orders Placed This Encounter  Procedures  . Myocardial Perfusion Imaging  . EKG 12-Lead    Disposition:   FU with Dr. Shirlee Latch  Signed, Leanna Battles  11/08/2015 4:42 PM    [facility] [address] [place], [place], [place]  [address] Phone: [phone]; Fax: [phone]

## 2015-11-08 NOTE — Patient Instructions (Signed)
Medication Instructions:  Your physician recommends that you continue on your current medications as directed. Please refer to the Current Medication list given to you today.   Labwork: Your physician recommends that you return for lab work in: 6 months (cmet/lipids/liver)   Testing/Procedures: Your physician has requested that you have a lexiscan myoview. For further information please visit [email or web address] Please follow instruction sheet, as given. CALL OUR OFFICE WHEN READY TO SCHEDULED    Follow-Up: Your physician wants you to follow-up in: 6 MONTHS WITH DR. Shirlee Latch. You will receive a reminder letter in the mail two months in advance. If you don't receive a letter, please call our office to schedule the follow-up appointment.   Any Other Special Instructions Will Be Listed Below (If Applicable).     If you need a refill on your cardiac medications before your next appointment, please call your pharmacy.

## 2015-11-22 ENCOUNTER — Ambulatory Visit (INDEPENDENT_AMBULATORY_CARE_PROVIDER_SITE_OTHER): Payer: Medicare Other | Admitting: *Deleted

## 2015-11-22 DIAGNOSIS — I2699 Other pulmonary embolism without acute cor pulmonale: Secondary | ICD-10-CM | POA: Diagnosis not present

## 2015-11-22 DIAGNOSIS — Z5181 Encounter for therapeutic drug level monitoring: Secondary | ICD-10-CM | POA: Diagnosis not present

## 2015-11-22 DIAGNOSIS — Z7901 Long term (current) use of anticoagulants: Secondary | ICD-10-CM

## 2015-11-22 LAB — POCT INR: INR: 1.8

## 2015-11-29 ENCOUNTER — Other Ambulatory Visit (INDEPENDENT_AMBULATORY_CARE_PROVIDER_SITE_OTHER): Payer: Medicare Other

## 2015-11-29 DIAGNOSIS — I2510 Atherosclerotic heart disease of native coronary artery without angina pectoris: Secondary | ICD-10-CM | POA: Diagnosis not present

## 2015-11-29 LAB — BASIC METABOLIC PANEL
BUN: 18 mg/dL (ref 7–25)
CHLORIDE: 106 mmol/L (ref 98–110)
CO2: 27 mmol/L (ref 20–31)
Calcium: 8.8 mg/dL (ref 8.6–10.4)
Creat: 1.06 mg/dL — ABNORMAL HIGH (ref 0.60–0.88)
GLUCOSE: 107 mg/dL — AB (ref 65–99)
POTASSIUM: 3.9 mmol/L (ref 3.5–5.3)
Sodium: 140 mmol/L (ref 135–146)

## 2015-12-06 ENCOUNTER — Ambulatory Visit (INDEPENDENT_AMBULATORY_CARE_PROVIDER_SITE_OTHER): Payer: Medicare Other | Admitting: *Deleted

## 2015-12-06 DIAGNOSIS — I2699 Other pulmonary embolism without acute cor pulmonale: Secondary | ICD-10-CM

## 2015-12-06 DIAGNOSIS — Z5181 Encounter for therapeutic drug level monitoring: Secondary | ICD-10-CM | POA: Diagnosis not present

## 2015-12-06 DIAGNOSIS — Z7901 Long term (current) use of anticoagulants: Secondary | ICD-10-CM | POA: Diagnosis not present

## 2015-12-06 LAB — POCT INR: INR: 1.6

## 2015-12-21 ENCOUNTER — Ambulatory Visit (INDEPENDENT_AMBULATORY_CARE_PROVIDER_SITE_OTHER): Payer: Medicare Other | Admitting: *Deleted

## 2015-12-21 DIAGNOSIS — Z7901 Long term (current) use of anticoagulants: Secondary | ICD-10-CM | POA: Diagnosis not present

## 2015-12-21 DIAGNOSIS — I2699 Other pulmonary embolism without acute cor pulmonale: Secondary | ICD-10-CM | POA: Diagnosis not present

## 2015-12-21 DIAGNOSIS — Z5181 Encounter for therapeutic drug level monitoring: Secondary | ICD-10-CM | POA: Diagnosis not present

## 2015-12-21 LAB — POCT INR: INR: 3

## 2016-01-03 ENCOUNTER — Telehealth: Payer: Self-pay | Admitting: Cardiology

## 2016-01-03 NOTE — Telephone Encounter (Signed)
I spoke with Ivor CostaNidhi Soni, [facility]. Nidhi states she is at the patient's home. Nidhi states for about 2 months  pt complains of chest pressure that lasts for a few seconds, wakes her up in the morning, not every morning, last time this morning. Nidhi states pt is asymptomatic at this time. Jolaine Artistidhi is requesting an appt for evaluation of chest pain for pt. Nidhi advised pt has been scheduled to see Lady SaucierLaura Ingold,NP 01/04/16 9:30AM. Jolaine Artistidhi confirmed pt agreed to appt and is aware of appt time.  Jolaine Artistidhi will advise pt to call 911 if she has recurrent symptoms prior to appt 01/04/16.

## 2016-01-03 NOTE — Telephone Encounter (Signed)
New message      Pt c/o of Chest Pain: STAT if CP now or developed within 24 hours  1. Are you having CP right now? no 2. Are you experiencing any other symptoms (ex. SOB, nausea, vomiting, sweating)? no 3. How long have you been experiencing CP? 2 months----this am was really bad  4. Is your CP continuous or coming and going? Comes and goes 5. Have you taken Nitroglycerin? no?

## 2016-01-03 NOTE — Progress Notes (Signed)
Cardiology Office Note   Date:  01/04/2016   ID:  Erin Roberson, DOB 06-26-1924, MRN [id number]  PCP:  Eino FarberKILPATRICK JR,GEORGE R, MD  Cardiologist:  Dr. Shirlee LatchMcLean   Chief Complaint  Patient presents with  . Chest Pain      History of Present Illness: Erin Roberson is a 82 y.o. female who presents for chest pain.    history of CAD with PTCA to the LAD in 1992 and idiopathic PE presents for cardiology followup. She is on chronic coumadin. Last cath in 2011 showed nonobstructive CAD. She has been doing well overall. Main complaint is sharp chest pain that she will get on occasion. It extends from her right arm across her right chest over to her left chest and lasts for a couple of seconds, then is gone. It is not exertional. She had a bad episode of this 2-3 weeks ago. No DOE. No lightheadedness or falls. No BRBPR or melena on warfarin. She lives alone and does all her ADLs.  Was seen 11/08/15 for different pain, more pressure and had nuc study ordered.  This was not done,  Now returning with chest pain.   Today she continues to have episodes of shooting chest pain.  The other night it was more significant and almost went to the ER.  None since. No associated symptoms but did awaken from sleep and lasted 1-2 min.  She admits to not sleeping well.      Past Medical History  Diagnosis Date  . Other pulmonary embolism and infarction     2011  . Pure hypercholesterolemia   . Overweight(278.02)   . Depression   . Unspecified cardiovascular disease   . Iron deficiency anemia, unspecified   . Hemorrhage of rectum and anus   . Leukocytopenia, unspecified   . Urinary hesitancy   . Diverticulosis of colon (without mention of hemorrhage)   . Coronary atherosclerosis   . Glaucoma   . Fibromyalgia   . UTI (lower urinary tract infection)   . History of uterine cancer   . Clotting disorder (HCC)   . Thyroid disease   . Dementia   . Breast mass     left breast  . Leukopenia    chronic--benign  . Arthritis     Past Surgical History  Procedure Laterality Date  . Cataract extraction  2004    right  . Cardiac catheterization  07/25/10    luminal irregularities in multiple vessels, no sig stenosis, EF 60%  . Abdominal hysterectomy    . Coronary angioplasty  1992    LAD  . Colonoscopy  07/01/2007    diverticulosis  . Upper gastrointestinal endoscopy  07/16/2007    tortous esophagus with spasms  . Thyroid needle aspiration  12/2007    right, hyperplastic nodule  . Kidney surgery      stent placed  . Neck surgery       Current Outpatient Prescriptions  Medication Sig Dispense Refill  . atorvastatin (LIPITOR) 80 MG tablet Take 80 mg by mouth daily at 6 PM. 1/2 tablet (total 40mg ) daily ON TUESDAYS /SATURDAY    . calcium-vitamin D (OSCAL WITH D) 500-200 MG-UNIT per tablet Take 1 tablet by mouth as needed.     . donepezil (ARICEPT) 5 MG tablet Take 5 mg by mouth at bedtime.     Marland Kitchen. latanoprost (XALATAN) 0.005 % ophthalmic solution Place 1 drop into both eyes at bedtime.     Marland Kitchen. OVER THE COUNTER MEDICATION Take 8  oz by mouth 3 (three) times daily. PRUNE JUICE    . warfarin (COUMADIN) 5 MG tablet TAKE AS DIRECTED BY COUMADIN CLINIC 40 tablet 3   No current facility-administered medications for this visit.    Allergies:   Review of patient's allergies indicates no known allergies.    Social History:  The patient  reports that she has never smoked. She has never used smokeless tobacco. She reports that she does not drink alcohol or use illicit drugs.   Family History:  The patient's family history includes Alcohol abuse in her brother; Diabetes in her mother; Emphysema in her brother; Hypertension in her mother; Pancreatic cancer in her father; Stroke in her maternal aunt. There is no history of Colon cancer or Heart attack.    ROS:  General:no colds or fevers, no weight changes Skin:no rashes or ulcers HEENT:no blurred vision, no congestion CV:see HPI PUL:see  HPI GI:no diarrhea constipation or melena, no indigestion GU:no hematuria, no dysuria MS:no joint pain, no claudication Neuro:no syncope, no lightheadedness Endo:no diabetes, no thyroid disease  Wt Readings from Last 3 Encounters:  01/04/16 140 lb (63.504 kg)  11/08/15 139 lb (63.05 kg)  06/01/15 143 lb 12.8 oz (65.227 kg)     PHYSICAL EXAM: VS:  BP 104/60 mmHg  Pulse 75  Ht  (1.676 m)  Wt 140 lb (63.504 kg)  BMI 22.61 kg/m2 , BMI Body mass index is 22.61 kg/(m^2). General:Pleasant affect, NAD Skin:Warm and dry, brisk capillary refill HEENT:normocephalic, sclera clear, mucus membranes moist Neck:supple, no JVD, no bruits  Heart:S1S2 RRR without murmur, gallup, rub or click Lungs:clear without rales, rhonchi, or wheezes ONG:EXBM, non tender, + BS, do not palpate liver spleen or masses Ext:no lower ext edema, 2+ pedal pulses, 2+ radial pulses Neuro:alert and oriented, MAE, follows commands, + facial symmetry    EKG:  EKG is ordered today. The ekg ordered today demonstrates SR with non specific SRT and T wave abnormality.  Now changes from 11/08/15.  Recent Labs: 06/01/2015: Hemoglobin 12.3; Platelets 194.0 11/29/2015: BUN 18; Creat 1.06*; Potassium 3.9; Sodium 140    Lipid Panel    Component Value Date/Time   CHOL 170 06/01/2015 1636   TRIG 108.0 06/01/2015 1636   HDL 52.40 06/01/2015 1636   CHOLHDL 3 06/01/2015 1636   VLDL 21.6 06/01/2015 1636   LDLCALC 96 06/01/2015 1636   LDLDIRECT 138.4 02/13/2013 1307       Other studies Reviewed: Additional studies/ records that were reviewed today include: previous office notes, last cath 2011. .with minimal non obstructing disease but does have hx of PTCA to LAD in the 90s.      ASSESSMENT AND PLAN:  1.  Chest pain pt with more freq. Episodes I recommended she have lexiscan myoview to help [place] decide if this is cardiac or not.  BP os borderline so would be difficult to add Imdur .  She has agreed to have done.  If  positive she may need cath though would review with Dr. Shirlee Latch first.  She will follow up after test.   2. Chronic anticoagulation is stable and no bleeding  3.  CAD  4. Hyperlipidemia stable. On statin     Current medicines are reviewed with the patient today.  The patient Has no concerns regarding medicines.  The following changes have been made:  See above Labs/ tests ordered today include:see above  Disposition:   FU:  see above  Signed, Dornell Grasmick R, NP  01/04/2016 1:14 PM  [place], [place], [place] Phone: [phone]; Fax: [phone]

## 2016-01-04 ENCOUNTER — Encounter: Payer: Self-pay | Admitting: Cardiology

## 2016-01-04 ENCOUNTER — Ambulatory Visit (INDEPENDENT_AMBULATORY_CARE_PROVIDER_SITE_OTHER): Payer: Medicare Other | Admitting: Cardiology

## 2016-01-04 VITALS — BP 104/60 | HR 75 | Ht 66.0 in | Wt 140.0 lb

## 2016-01-04 DIAGNOSIS — R079 Chest pain, unspecified: Secondary | ICD-10-CM | POA: Diagnosis not present

## 2016-01-04 DIAGNOSIS — I2510 Atherosclerotic heart disease of native coronary artery without angina pectoris: Secondary | ICD-10-CM | POA: Diagnosis not present

## 2016-01-04 DIAGNOSIS — Z7901 Long term (current) use of anticoagulants: Secondary | ICD-10-CM | POA: Diagnosis not present

## 2016-01-04 DIAGNOSIS — E785 Hyperlipidemia, unspecified: Secondary | ICD-10-CM | POA: Diagnosis not present

## 2016-01-04 NOTE — Patient Instructions (Addendum)
Medication Instructions:  Your physician recommends that you continue on your current medications as directed. Please refer to the Current Medication list given to you today.   Labwork: None ordered  Testing/Procedures: Your physician has requested that you have a lexiscan myoview. For further information please visit [email or web address]. Please follow instruction sheet, as given.   Follow-Up: Your physician recommends that you schedule a follow-up appointment in: 1-2 WEEKS AFTER TEST WITH 1ST AVAILABLE EXTENDER   Any Other Special Instructions Will Be Listed Below (If Applicable).  Pharmacologic Stress Electrocardiogram A pharmacologic stress electrocardiogram is a heart (cardiac) test that uses nuclear imaging to evaluate the blood supply to your heart. This test may also be called a pharmacologic stress electrocardiography. Pharmacologic means that a medicine is used to increase your heart rate and blood pressure.  This stress test is done to find areas of poor blood flow to the heart by determining the extent of coronary artery disease (CAD). Some people exercise on a treadmill, which naturally increases the blood flow to the heart. For those people unable to exercise on a treadmill, a medicine is used. This medicine stimulates your heart and will cause your heart to beat harder and more quickly, as if you were exercising.  Pharmacologic stress tests can help determine:  The adequacy of blood flow to your heart during increased levels of activity in order to clear you for discharge home.  The extent of coronary artery blockage caused by CAD.  Your prognosis if you have suffered a heart attack.  The effectiveness of cardiac procedures done, such as an angioplasty, which can increase the circulation in your coronary arteries.  Causes of chest pain or pressure. LET [facility] PROVID[facility] KNOW ABOUT:  Any allergies you have.  All medicines you are taking, including vitamins,  herbs, eye drops, creams, and over-the-counter medicines.  Previous problems you or members of your family have had with the use of anesthetics.  Any blood disorders you have.  Previous surgeries you have had.  Medical conditions you have.  Possibility of pregnancy, if this applies.  If you are currently breastfeeding. RISKS AND COMPLICATIONS Generally, this is a safe procedure. However, as with any procedure, complications can occur. Possible complications include:  You develop pain or pressure in the following areas:  Chest.  Jaw or neck.  Between your shoulder blades.  Radiating down your left arm.  Headache.  Dizziness or light-headedness.  Shortness of breath.  Increased or irregular heartbeat.  Low blood pressure.  Nausea or vomiting.  Flushing.  Redness going up the arm and slight pain during injection of medicine.  Heart attack (rare). BEFORE THE PROCEDURE   Avoid all forms of caffeine for 24 hours before your test or as directed by your health care provider. This includes coffee, tea (even decaffeinated tea), caffeinated sodas, chocolate, cocoa, and certain pain medicines.  Follow your health care provider's instructions regarding eating and drinking before the test.  Take your medicines as directed at regular times with water unless instructed otherwise. Exceptions may include:  If you have diabetes, ask how you are to take your insulin or pills. It is common to adjust insulin dosing the morning of the test.  If you are taking beta-blocker medicines, it is important to talk to your health care provider about these medicines well before the date of your test. Taking beta-blocker medicines may interfere with the test. In some cases, these medicines need to be changed or stopped 24 hours or more before the  test.  If you wear a nitroglycerin patch, it may need to be removed prior to the test. Ask your health care provider if the patch should be removed  before the test.  If you use an inhaler for any breathing condition, bring it with you to the test.  If you are an outpatient, bring a snack so you can eat right after the stress phase of the test.  Do not smoke for 4 hours prior to the test or as directed by your health care provider.  Do not apply lotions, powders, creams, or oils on your chest prior to the test.  Wear comfortable shoes and clothing. Let your health care provider know if you were unable to complete or follow the preparations for your test. PROCEDURE   Multiple patches (electrodes) will be put on your chest. If needed, small areas of your chest may be shaved to get better contact with the electrodes. Once the electrodes are attached to your body, multiple wires will be attached to the electrodes, and your heart rate will be monitored.  An IV access will be started. A nuclear trace (isotope) is given. The isotope may be given intravenously, or it may be swallowed. Nuclear refers to several types of radioactive isotopes, and the nuclear isotope lights up the arteries so that the nuclear images are clear. The isotope is absorbed by your body. This results in low radiation exposure.  A resting nuclear image is taken to show how your heart functions at rest.  A medicine is given through the IV access.  A second scan is done about 1 hour after the medicine injection and determines how your heart functions under stress.  During this stress phase, you will be connected to an electrocardiogram machine. Your blood pressure and oxygen levels will be monitored. AFTER THE PROCEDURE   Your heart rate and blood pressure will be monitored after the test.  You may return to your normal schedule, including diet,activities, and medicines, unless your health care provider tells you otherwise.   This information is not intended to replace advice given to you by your health care provider. Make sure you discuss any questions you have with  your health care provider.   Document Released: 02/03/2009 Document Revised: 09/22/2013 Document Reviewed: 05/25/2013 Elsevier Interactive Patient Education [employer]    If you need a refill on your cardiac medications before your next appointment, please call your pharmacy.

## 2016-01-10 ENCOUNTER — Ambulatory Visit: Payer: Medicare Other | Admitting: Cardiology

## 2016-01-11 ENCOUNTER — Telehealth (HOSPITAL_COMMUNITY): Payer: Self-pay | Admitting: Radiology

## 2016-01-11 ENCOUNTER — Telehealth (HOSPITAL_COMMUNITY): Payer: Self-pay | Admitting: *Deleted

## 2016-01-11 NOTE — Telephone Encounter (Signed)
Patient given detailed instructions per Myocardial Perfusion Study Information Sheet for the test on 01/17/2016 at 12:30. Patient notified to arrive 15 minutes early and that it is imperative to arrive on time for appointment to keep from having the test rescheduled.  If you need to cancel or reschedule your appointment, please call the office within 24 hours of your appointment. Failure to do so may result in a cancellation of your appointment, and a $50 no show fee. Patient verbalized understanding.EHK

## 2016-01-11 NOTE — Telephone Encounter (Signed)
Attempted to call patient regarding upcoming appointment - mailbox full. Antionette CharMary J Chanan Detwiler, RN

## 2016-01-13 ENCOUNTER — Encounter (HOSPITAL_COMMUNITY): Payer: Medicare Other

## 2016-01-16 ENCOUNTER — Telehealth (HOSPITAL_COMMUNITY): Payer: Self-pay | Admitting: *Deleted

## 2016-01-16 NOTE — Telephone Encounter (Signed)
Patient given detailed instructions per Myocardial Perfusion Study Information Sheet for the test on 01/17/16 at 1230. Patient notified to arrive 15 minutes early and that it is imperative to arrive on time for appointment to keep from having the test rescheduled.  If you need to cancel or reschedule your appointment, please call the office within 24 hours of your appointment. Failure to do so may result in a cancellation of your appointment, and a $50 no show fee. Patient verbalized understanding.Laiyah Exline, Adelene IdlerCynthia W

## 2016-01-17 ENCOUNTER — Ambulatory Visit (INDEPENDENT_AMBULATORY_CARE_PROVIDER_SITE_OTHER): Payer: Medicare Other | Admitting: Pharmacist

## 2016-01-17 ENCOUNTER — Ambulatory Visit (HOSPITAL_COMMUNITY): Payer: Medicare Other | Attending: Internal Medicine

## 2016-01-17 DIAGNOSIS — Z7901 Long term (current) use of anticoagulants: Secondary | ICD-10-CM

## 2016-01-17 DIAGNOSIS — Z5181 Encounter for therapeutic drug level monitoring: Secondary | ICD-10-CM | POA: Diagnosis not present

## 2016-01-17 DIAGNOSIS — R002 Palpitations: Secondary | ICD-10-CM | POA: Insufficient documentation

## 2016-01-17 DIAGNOSIS — R9439 Abnormal result of other cardiovascular function study: Secondary | ICD-10-CM | POA: Insufficient documentation

## 2016-01-17 DIAGNOSIS — R079 Chest pain, unspecified: Secondary | ICD-10-CM | POA: Insufficient documentation

## 2016-01-17 DIAGNOSIS — I2699 Other pulmonary embolism without acute cor pulmonale: Secondary | ICD-10-CM | POA: Diagnosis not present

## 2016-01-17 LAB — MYOCARDIAL PERFUSION IMAGING
CSEPPHR: 86 {beats}/min
LHR: 0.26
LV dias vol: 58 mL (ref 46–106)
LVSYSVOL: 24 mL
Rest HR: 58 {beats}/min
SDS: 0
SRS: 1
SSS: 1
TID: 0.88

## 2016-01-17 LAB — POCT INR: INR: 4.4

## 2016-01-17 MED ORDER — TECHNETIUM TC 99M SESTAMIBI GENERIC - CARDIOLITE
10.1000 | Freq: Once | INTRAVENOUS | Status: AC | PRN
  Administered 2016-01-17: 10.1 via INTRAVENOUS

## 2016-01-17 MED ORDER — REGADENOSON 0.4 MG/5ML IV SOLN
0.4000 mg | Freq: Once | INTRAVENOUS | Status: AC
  Administered 2016-01-17: 0.4 mg via INTRAVENOUS

## 2016-01-17 MED ORDER — TECHNETIUM TC 99M SESTAMIBI GENERIC - CARDIOLITE
32.8000 | Freq: Once | INTRAVENOUS | Status: AC | PRN
  Administered 2016-01-17: 32.8 via INTRAVENOUS

## 2016-01-17 NOTE — Progress Notes (Signed)
Cardiology Office Note    Date:  01/18/2016   ID:  Erin HoofMary N Markovitz, DOB [birth year], MRN [id number]  PCP:  Eino FarberKILPATRICK JR,GEORGE R, MD  Cardiologist:  Dr. Shirlee LatchMcLean   1 month f/u  History of Present Illness:  Erin Roberson is a 82 y.o. 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   Last cath in 10/11 with nonobstructive disease, EF 60% by LV-gram.She was last seen by Dr. Shirlee LatchMcLean in 05/2015. She did note fleeting, sharp chest pain at that time that he felt was atypical.  She was seen by Theodore Demarkhonda Barrett PA-C on 11/08/15 for evaluation of recurrent chest pain. She felt that it was atypical but but she offered to order a stress test for reassurance.   She never had stress test and then was added on to Nada BoozerLaura Ingold NP's schedule on 01/04/16 for evaluation of continued chest pain. Again, she recommended nuclear stress test.   This was completed on 01/17/16 and was considered low risk with no signed of ischemia. Dr. Shirlee LatchMcLean reviewed and felt it was low risk and he would not pursue cath.    Today she presents to clinic for follow up. She is feeling pretty good today with no more chest pain. No SOB, LE edema, orthopnea, or PND. No dizziness or syncope.  She has had some lower leg pressure when she wakes up in the AM. It usually gets better as the day goes on.    1. History of PE: Idiopathic, on chronic coumadin because no trigger for PE identified . 2. Hyperlipidemia 3. Diverticulosis 4. H/o UTI 5. H/o uterine cancer 6. Dementia 7. CAD: PTCA LAD 1992. Last cath in 10/11 with nonobstructive disease, EF 60% by LV-gram. Echo (10/11) with EF 55-60%, PA systolic pressure 36 mmHg.   SH: Divorced, [place], no children, nonsmoker.   FH: Father with pancreatic cancer.  Past Medical History  Diagnosis Date  . Other pulmonary embolism and infarction     2011  . Pure  hypercholesterolemia   . Overweight(278.02)   . Depression   . Unspecified cardiovascular disease   . Iron deficiency anemia, unspecified   . Hemorrhage of rectum and anus   . Leukocytopenia, unspecified   . Urinary hesitancy   . Diverticulosis of colon (without mention of hemorrhage)   . Coronary atherosclerosis   . Glaucoma   . Fibromyalgia   . UTI (lower urinary tract infection)   . History of uterine cancer   . Clotting disorder (HCC)   . Thyroid disease   . Dementia   . Breast mass     left breast  . Leukopenia     chronic--benign  . Arthritis     Past Surgical History  Procedure Laterality Date  . Cataract extraction  2004    right  . Cardiac catheterization  07/25/10    luminal irregularities in multiple vessels, no sig stenosis, EF 60%  . Abdominal hysterectomy    . Coronary angioplasty  1992    LAD  . Colonoscopy  07/01/2007    diverticulosis  . Upper gastrointestinal endoscopy  07/16/2007    tortous esophagus with spasms  . Thyroid needle aspiration  12/2007    right, hyperplastic nodule  . Kidney surgery      stent placed  . Neck surgery      Current Medications: Outpatient Prescriptions Prior to  Visit  Medication Sig Dispense Refill  . atorvastatin (LIPITOR) 80 MG tablet Take 80 mg by mouth daily at 6 PM. 1/2 tablet (total ) daily ON TUESDAYS /SATURDAY    . calcium-vitamin D (OSCAL WITH D) 500-200 MG-UNIT per tablet Take 1 tablet by mouth as needed.     . donepezil (ARICEPT) 5 MG tablet Take 5 mg by mouth at bedtime.     Marland Kitchen latanoprost (XALATAN) 0.005 % ophthalmic solution Place 1 drop into both eyes at bedtime.     Marland Kitchen OVER THE COUNTER MEDICATION Take 8 oz by mouth 3 (three) times daily. PRUNE JUICE    . warfarin (COUMADIN) 5 MG tablet TAKE AS DIRECTED BY COUMADIN CLINIC 40 tablet 3   No facility-administered medications prior to visit.     Allergies:   Review of patient's allergies indicates no known allergies.   Social History   Social  History  . Marital Status: Divorced    Spouse Name: N/A  . Number of Children: N/A  . Years of Education: N/A   Occupational History  .     Social History Main Topics  . Smoking status: Never Smoker   . Smokeless tobacco: Never Used  . Alcohol Use: No  . Drug Use: No  . Sexual Activity: Not Asked   Other Topics Concern  . None   Social History Narrative     Family History:  The patient's family history includes Alcohol abuse in her brother; Diabetes in her mother; Emphysema in her brother; Hypertension in her mother; Pancreatic cancer in her father; Stroke in her maternal aunt. There is no history of Colon cancer or Heart attack.   ROS:   Please see the history of present illness.    ROS All other systems reviewed and are negative.   PHYSICAL EXAM:   VS:  BP 104/58 mmHg  Pulse 80  Ht  (1.676 m)  Wt 136 lb (61.689 kg)  BMI 21.96 kg/m2   GEN: Well nourished, well developed, in no acute distress HEENT: normal Neck: no JVD, carotid bruits, or masses Cardiac: RRR; no murmurs, rubs, or gallops,no edema  Respiratory:  clear to auscultation bilaterally, normal work of breathing GI: soft, nontender, nondistended, + BS MS: no deformity or atrophy Skin: warm and dry, no rash Neuro:  Alert and Oriented x 3, Strength and sensation are intact Psych: euthymic mood, full affect  Wt Readings from Last 3 Encounters:  01/18/16 136 lb (61.689 kg)  01/17/16 140 lb (63.504 kg)  01/04/16 140 lb (63.504 kg)      Studies/Labs Reviewed:   EKG:  EKG is not ordered today.    Recent Labs: 06/01/2015: Hemoglobin 12.3; Platelets 194.0 11/29/2015: BUN 18; Creat 1.06*; Potassium 3.9; Sodium 140   Lipid Panel    Component Value Date/Time   CHOL 170 06/01/2015 1636   TRIG 108.0 06/01/2015 1636   HDL 52.40 06/01/2015 1636   CHOLHDL 3 06/01/2015 1636   VLDL 21.6 06/01/2015 1636   LDLCALC 96 06/01/2015 1636   LDLDIRECT 138.4 02/13/2013 1307    Additional studies/ records that  were reviewed today include:   Myoview: 01/17/16 Study Highlights     Nuclear stress EF: 59%. No wall motion abnormalities  There was no ST segment deviation noted during stress.  This is a low risk study.  Defect 1: There is a small defect of mild severity present in the apex location. No ischemia       ASSESSMENT:    1. Atypical chest  pain   2. History of pulmonary embolism   3. CAD S/P percutaneous coronary angioplasty   4. HLD (hyperlipidemia)      PLAN:  In order of problems listed above:  Atypical chest pain: nuclear stress test 01/17/16 low risk with no ischemia. No further chest pain. No further w/u  Hx of idiopathic PE: No trigger for PE. She is on chronic coumadin with no problems.   CAD s/p PCTA LAD (1998): Stable. She has atypical chest pain that is probably noncardiac. She is not on ASA due to stable CAD with coumadin use.She is on a statin.   HLD: lipids checked in 05/2015: LDL 96. Continue statin .   Medication Adjustments/Labs and Tests Ordered: Current medicines are reviewed at length with the patient today.  Concerns regarding medicines are outlined above.  Medication changes, Labs and Tests ordered today are listed in the Patient Instructions below. There are no Patient Instructions on file for this visit.   Charlestine Massed  01/18/2016 11:58 AM    [facility] [address] [place], [place], [place]  [address] Phone: [phone]; Fax: [phone]

## 2016-01-18 ENCOUNTER — Ambulatory Visit (INDEPENDENT_AMBULATORY_CARE_PROVIDER_SITE_OTHER): Payer: Medicare Other | Admitting: Physician Assistant

## 2016-01-18 ENCOUNTER — Encounter: Payer: Self-pay | Admitting: Physician Assistant

## 2016-01-18 VITALS — BP 104/58 | HR 80 | Ht 66.0 in | Wt 136.0 lb

## 2016-01-18 DIAGNOSIS — E785 Hyperlipidemia, unspecified: Secondary | ICD-10-CM | POA: Diagnosis not present

## 2016-01-18 DIAGNOSIS — R0789 Other chest pain: Secondary | ICD-10-CM

## 2016-01-18 DIAGNOSIS — I2510 Atherosclerotic heart disease of native coronary artery without angina pectoris: Secondary | ICD-10-CM | POA: Diagnosis not present

## 2016-01-18 DIAGNOSIS — Z9861 Coronary angioplasty status: Secondary | ICD-10-CM

## 2016-01-18 DIAGNOSIS — Z86711 Personal history of pulmonary embolism: Secondary | ICD-10-CM

## 2016-01-18 NOTE — Patient Instructions (Signed)
Medication Instructions:  Your physician recommends that you continue on your current medications as directed. Please refer to the Current Medication list given to you today.   Labwork: NONE ORDERED  Testing/Procedures: NONE ORDERED  Follow-Up: Your physician wants you to follow-up in: 6 MONTHS WITH DR. Shirlee LatchMCLEAN  You will receive a reminder letter in the mail two months in advance. If you don't receive a letter, please call our office to schedule the follow-up appointment.   Any Other Special Instructions Will Be Listed Below (If Applicable).     If you need a refill on your cardiac medications before your next appointment, please call your pharmacy.

## 2016-01-31 ENCOUNTER — Ambulatory Visit (INDEPENDENT_AMBULATORY_CARE_PROVIDER_SITE_OTHER): Payer: Medicare Other | Admitting: *Deleted

## 2016-01-31 DIAGNOSIS — Z5181 Encounter for therapeutic drug level monitoring: Secondary | ICD-10-CM

## 2016-01-31 LAB — POCT INR: INR: 3.6

## 2016-02-07 ENCOUNTER — Other Ambulatory Visit: Payer: Self-pay | Admitting: Cardiology

## 2016-02-08 ENCOUNTER — Other Ambulatory Visit: Payer: Self-pay | Admitting: Cardiology

## 2016-02-08 NOTE — Telephone Encounter (Signed)
Spoke with pharmacist at [facility] and they did get ERx that was sent in this morning at 745am and pt has received the refill.

## 2016-02-08 NOTE — Telephone Encounter (Signed)
°*  STAT* If patient is at the pharmacy, call can be transferred to refill team.   1. Which medications need to be refilled? (please list name of each medication and dose if known)Warfarin-need this asap-pt going out of town this morning at 9:00,she does not have any left  2. Which pharmacy/location (including street and city if local pharmacy) is medication to be sent to?[phone]  3. Do they need a 30 day or 90 day supply? # 40

## 2016-02-14 ENCOUNTER — Ambulatory Visit (INDEPENDENT_AMBULATORY_CARE_PROVIDER_SITE_OTHER): Payer: Medicare Other | Admitting: *Deleted

## 2016-02-14 DIAGNOSIS — Z5181 Encounter for therapeutic drug level monitoring: Secondary | ICD-10-CM

## 2016-02-14 LAB — POCT INR: INR: 2.1

## 2016-03-06 ENCOUNTER — Ambulatory Visit (INDEPENDENT_AMBULATORY_CARE_PROVIDER_SITE_OTHER): Payer: Medicare Other

## 2016-03-06 DIAGNOSIS — Z5181 Encounter for therapeutic drug level monitoring: Secondary | ICD-10-CM | POA: Diagnosis not present

## 2016-03-06 LAB — POCT INR: INR: 3.2

## 2016-03-26 ENCOUNTER — Ambulatory Visit (INDEPENDENT_AMBULATORY_CARE_PROVIDER_SITE_OTHER): Payer: Medicare Other | Admitting: *Deleted

## 2016-03-26 DIAGNOSIS — Z5181 Encounter for therapeutic drug level monitoring: Secondary | ICD-10-CM

## 2016-03-26 LAB — POCT INR: INR: 1.5

## 2016-04-05 ENCOUNTER — Ambulatory Visit (INDEPENDENT_AMBULATORY_CARE_PROVIDER_SITE_OTHER): Payer: Medicare Other | Admitting: *Deleted

## 2016-04-05 DIAGNOSIS — Z5181 Encounter for therapeutic drug level monitoring: Secondary | ICD-10-CM | POA: Diagnosis not present

## 2016-04-05 LAB — POCT INR: INR: 2.9

## 2016-04-26 ENCOUNTER — Ambulatory Visit (INDEPENDENT_AMBULATORY_CARE_PROVIDER_SITE_OTHER): Payer: Medicare Other | Admitting: *Deleted

## 2016-04-26 DIAGNOSIS — Z5181 Encounter for therapeutic drug level monitoring: Secondary | ICD-10-CM

## 2016-04-26 LAB — POCT INR: INR: 1.9

## 2016-05-24 ENCOUNTER — Ambulatory Visit (INDEPENDENT_AMBULATORY_CARE_PROVIDER_SITE_OTHER): Payer: Medicare Other | Admitting: Pharmacist

## 2016-05-24 DIAGNOSIS — Z5181 Encounter for therapeutic drug level monitoring: Secondary | ICD-10-CM | POA: Diagnosis not present

## 2016-05-24 LAB — POCT INR: INR: 1.3

## 2016-06-05 ENCOUNTER — Ambulatory Visit (INDEPENDENT_AMBULATORY_CARE_PROVIDER_SITE_OTHER): Payer: Medicare Other | Admitting: *Deleted

## 2016-06-05 DIAGNOSIS — Z5181 Encounter for therapeutic drug level monitoring: Secondary | ICD-10-CM | POA: Diagnosis not present

## 2016-06-05 LAB — POCT INR: INR: 1.7

## 2016-06-19 ENCOUNTER — Ambulatory Visit (INDEPENDENT_AMBULATORY_CARE_PROVIDER_SITE_OTHER): Payer: Medicare Other | Admitting: *Deleted

## 2016-06-19 DIAGNOSIS — Z5181 Encounter for therapeutic drug level monitoring: Secondary | ICD-10-CM | POA: Diagnosis not present

## 2016-06-19 LAB — POCT INR: INR: 2

## 2016-07-06 ENCOUNTER — Ambulatory Visit (INDEPENDENT_AMBULATORY_CARE_PROVIDER_SITE_OTHER): Payer: Medicare Other | Admitting: Pharmacist

## 2016-07-06 DIAGNOSIS — Z5181 Encounter for therapeutic drug level monitoring: Secondary | ICD-10-CM

## 2016-07-06 LAB — POCT INR: INR: 1.4

## 2016-07-09 ENCOUNTER — Other Ambulatory Visit: Payer: Self-pay | Admitting: *Deleted

## 2016-07-09 MED ORDER — WARFARIN SODIUM 5 MG PO TABS: ORAL_TABLET | ORAL | 3 refills | Status: DC

## 2016-07-11 ENCOUNTER — Other Ambulatory Visit: Payer: Self-pay | Admitting: *Deleted

## 2016-07-11 MED ORDER — WARFARIN SODIUM 5 MG PO TABS: ORAL_TABLET | ORAL | 3 refills | Status: DC

## 2016-07-17 ENCOUNTER — Ambulatory Visit (INDEPENDENT_AMBULATORY_CARE_PROVIDER_SITE_OTHER): Payer: Medicare Other | Admitting: *Deleted

## 2016-07-17 DIAGNOSIS — Z5181 Encounter for therapeutic drug level monitoring: Secondary | ICD-10-CM

## 2016-07-17 LAB — POCT INR: INR: 1.7

## 2016-07-27 ENCOUNTER — Ambulatory Visit (INDEPENDENT_AMBULATORY_CARE_PROVIDER_SITE_OTHER): Payer: Medicare Other | Admitting: *Deleted

## 2016-07-27 DIAGNOSIS — Z5181 Encounter for therapeutic drug level monitoring: Secondary | ICD-10-CM

## 2016-07-27 LAB — POCT INR: INR: 2

## 2016-08-14 ENCOUNTER — Ambulatory Visit (INDEPENDENT_AMBULATORY_CARE_PROVIDER_SITE_OTHER): Payer: Medicare Other | Admitting: *Deleted

## 2016-08-14 DIAGNOSIS — Z5181 Encounter for therapeutic drug level monitoring: Secondary | ICD-10-CM | POA: Diagnosis not present

## 2016-08-14 LAB — POCT INR: INR: 2

## 2016-09-11 ENCOUNTER — Ambulatory Visit (INDEPENDENT_AMBULATORY_CARE_PROVIDER_SITE_OTHER): Payer: Medicare Other | Admitting: *Deleted

## 2016-09-11 DIAGNOSIS — Z5181 Encounter for therapeutic drug level monitoring: Secondary | ICD-10-CM

## 2016-09-11 LAB — POCT INR: INR: 2.6

## 2016-10-30 ENCOUNTER — Ambulatory Visit (INDEPENDENT_AMBULATORY_CARE_PROVIDER_SITE_OTHER): Payer: Medicare Other | Admitting: *Deleted

## 2016-10-30 ENCOUNTER — Encounter (INDEPENDENT_AMBULATORY_CARE_PROVIDER_SITE_OTHER): Payer: Self-pay

## 2016-10-30 DIAGNOSIS — Z5181 Encounter for therapeutic drug level monitoring: Secondary | ICD-10-CM | POA: Diagnosis not present

## 2016-10-30 LAB — POCT INR: INR: 2.5

## 2016-11-10 ENCOUNTER — Observation Stay (HOSPITAL_COMMUNITY)
Admission: EM | Admit: 2016-11-10 | Discharge: 2016-11-11 | Disposition: A | Discharge status: Home / Self Care | Payer: Medicare Other | Attending: Internal Medicine | Admitting: Internal Medicine

## 2016-11-10 ENCOUNTER — Encounter (HOSPITAL_COMMUNITY): Payer: Self-pay | Admitting: Nurse Practitioner

## 2016-11-10 ENCOUNTER — Emergency Department (HOSPITAL_COMMUNITY): Payer: Medicare Other

## 2016-11-10 DIAGNOSIS — G8929 Other chronic pain: Secondary | ICD-10-CM | POA: Insufficient documentation

## 2016-11-10 DIAGNOSIS — E079 Disorder of thyroid, unspecified: Secondary | ICD-10-CM | POA: Diagnosis not present

## 2016-11-10 DIAGNOSIS — Z9841 Cataract extraction status, right eye: Secondary | ICD-10-CM | POA: Insufficient documentation

## 2016-11-10 DIAGNOSIS — Z823 Family history of stroke: Secondary | ICD-10-CM | POA: Insufficient documentation

## 2016-11-10 DIAGNOSIS — Z9861 Coronary angioplasty status: Secondary | ICD-10-CM | POA: Insufficient documentation

## 2016-11-10 DIAGNOSIS — H409 Unspecified glaucoma: Secondary | ICD-10-CM | POA: Diagnosis not present

## 2016-11-10 DIAGNOSIS — K5909 Other constipation: Secondary | ICD-10-CM | POA: Diagnosis not present

## 2016-11-10 DIAGNOSIS — R002 Palpitations: Secondary | ICD-10-CM | POA: Diagnosis not present

## 2016-11-10 DIAGNOSIS — K5900 Constipation, unspecified: Secondary | ICD-10-CM | POA: Insufficient documentation

## 2016-11-10 DIAGNOSIS — Z7901 Long term (current) use of anticoagulants: Secondary | ICD-10-CM | POA: Diagnosis not present

## 2016-11-10 DIAGNOSIS — K5730 Diverticulosis of large intestine without perforation or abscess without bleeding: Secondary | ICD-10-CM | POA: Insufficient documentation

## 2016-11-10 DIAGNOSIS — Z8249 Family history of ischemic heart disease and other diseases of the circulatory system: Secondary | ICD-10-CM | POA: Insufficient documentation

## 2016-11-10 DIAGNOSIS — I082 Rheumatic disorders of both aortic and tricuspid valves: Secondary | ICD-10-CM | POA: Insufficient documentation

## 2016-11-10 DIAGNOSIS — Z90710 Acquired absence of both cervix and uterus: Secondary | ICD-10-CM | POA: Insufficient documentation

## 2016-11-10 DIAGNOSIS — F329 Major depressive disorder, single episode, unspecified: Secondary | ICD-10-CM | POA: Insufficient documentation

## 2016-11-10 DIAGNOSIS — D72819 Decreased white blood cell count, unspecified: Secondary | ICD-10-CM | POA: Diagnosis not present

## 2016-11-10 DIAGNOSIS — Z833 Family history of diabetes mellitus: Secondary | ICD-10-CM | POA: Insufficient documentation

## 2016-11-10 DIAGNOSIS — R001 Bradycardia, unspecified: Secondary | ICD-10-CM | POA: Diagnosis present

## 2016-11-10 DIAGNOSIS — E785 Hyperlipidemia, unspecified: Secondary | ICD-10-CM | POA: Diagnosis not present

## 2016-11-10 DIAGNOSIS — E7800 Pure hypercholesterolemia, unspecified: Secondary | ICD-10-CM | POA: Insufficient documentation

## 2016-11-10 DIAGNOSIS — Z825 Family history of asthma and other chronic lower respiratory diseases: Secondary | ICD-10-CM | POA: Insufficient documentation

## 2016-11-10 DIAGNOSIS — I2510 Atherosclerotic heart disease of native coronary artery without angina pectoris: Secondary | ICD-10-CM | POA: Diagnosis present

## 2016-11-10 DIAGNOSIS — Z8542 Personal history of malignant neoplasm of other parts of uterus: Secondary | ICD-10-CM | POA: Insufficient documentation

## 2016-11-10 DIAGNOSIS — R55 Syncope and collapse: Principal | ICD-10-CM

## 2016-11-10 DIAGNOSIS — R42 Dizziness and giddiness: Secondary | ICD-10-CM

## 2016-11-10 DIAGNOSIS — Z86711 Personal history of pulmonary embolism: Secondary | ICD-10-CM | POA: Diagnosis not present

## 2016-11-10 DIAGNOSIS — F0390 Unspecified dementia without behavioral disturbance: Secondary | ICD-10-CM | POA: Diagnosis not present

## 2016-11-10 DIAGNOSIS — M797 Fibromyalgia: Secondary | ICD-10-CM | POA: Diagnosis not present

## 2016-11-10 DIAGNOSIS — Z8262 Family history of osteoporosis: Secondary | ICD-10-CM | POA: Insufficient documentation

## 2016-11-10 DIAGNOSIS — Z811 Family history of alcohol abuse and dependence: Secondary | ICD-10-CM | POA: Insufficient documentation

## 2016-11-10 DIAGNOSIS — Z800 Family history of malignant neoplasm of digestive organs: Secondary | ICD-10-CM | POA: Insufficient documentation

## 2016-11-10 DIAGNOSIS — I739 Peripheral vascular disease, unspecified: Secondary | ICD-10-CM | POA: Insufficient documentation

## 2016-11-10 DIAGNOSIS — D689 Coagulation defect, unspecified: Secondary | ICD-10-CM | POA: Insufficient documentation

## 2016-11-10 DIAGNOSIS — Z79899 Other long term (current) drug therapy: Secondary | ICD-10-CM | POA: Insufficient documentation

## 2016-11-10 LAB — CBC WITH DIFFERENTIAL/PLATELET
BASOS PCT: 0 %
Basophils Absolute: 0 10*3/uL (ref 0.0–0.1)
EOS PCT: 1 %
Eosinophils Absolute: 0 10*3/uL (ref 0.0–0.7)
HCT: 39.2 % (ref 36.0–46.0)
Hemoglobin: 13.1 g/dL (ref 12.0–15.0)
Lymphocytes Relative: 26 %
Lymphs Abs: 1 10*3/uL (ref 0.7–4.0)
MCH: 31.4 pg (ref 26.0–34.0)
MCHC: 33.4 g/dL (ref 30.0–36.0)
MCV: 94 fL (ref 78.0–100.0)
Monocytes Absolute: 0.2 10*3/uL (ref 0.1–1.0)
Monocytes Relative: 6 %
Neutro Abs: 2.5 10*3/uL (ref 1.7–7.7)
Neutrophils Relative %: 67 %
PLATELETS: 184 10*3/uL (ref 150–400)
RBC: 4.17 MIL/uL (ref 3.87–5.11)
RDW: 13.3 % (ref 11.5–15.5)
WBC: 3.7 10*3/uL — AB (ref 4.0–10.5)

## 2016-11-10 LAB — PROTIME-INR
INR: 1.22
Prothrombin Time: 15.4 seconds — ABNORMAL HIGH (ref 11.4–15.2)

## 2016-11-10 LAB — URINALYSIS, ROUTINE W REFLEX MICROSCOPIC
Bacteria, UA: NONE SEEN
GLUCOSE, UA: NEGATIVE mg/dL
HGB URINE DIPSTICK: NEGATIVE
Ketones, ur: 5 mg/dL — AB
NITRITE: NEGATIVE
PH: 5 (ref 5.0–8.0)
Protein, ur: 30 mg/dL — AB
SPECIFIC GRAVITY, URINE: 1.03 (ref 1.005–1.030)

## 2016-11-10 LAB — COMPREHENSIVE METABOLIC PANEL
ALBUMIN: 3.6 g/dL (ref 3.5–5.0)
ALK PHOS: 76 U/L (ref 38–126)
ALT: 14 U/L (ref 14–54)
ANION GAP: 5 (ref 5–15)
AST: 22 U/L (ref 15–41)
BILIRUBIN TOTAL: 0.5 mg/dL (ref 0.3–1.2)
BUN: 16 mg/dL (ref 6–20)
CALCIUM: 8.8 mg/dL — AB (ref 8.9–10.3)
CO2: 28 mmol/L (ref 22–32)
Chloride: 105 mmol/L (ref 101–111)
Creatinine, Ser: 0.99 mg/dL (ref 0.44–1.00)
GFR calc Af Amer: 56 mL/min — ABNORMAL LOW (ref >60)
GFR, EST NON AFRICAN AMERICAN: 49 mL/min — AB (ref >60)
GLUCOSE: 119 mg/dL — AB (ref 65–99)
Potassium: 4.2 mmol/L (ref 3.5–5.1)
Sodium: 138 mmol/L (ref 135–145)
TOTAL PROTEIN: 6.8 g/dL (ref 6.5–8.1)

## 2016-11-10 LAB — I-STAT TROPONIN, ED: TROPONIN I, POC: 0 ng/mL (ref 0.00–0.08)

## 2016-11-10 LAB — TROPONIN I

## 2016-11-10 LAB — D-DIMER, QUANTITATIVE: D-Dimer, Quant: 0.57 ug/mL-FEU — ABNORMAL HIGH (ref 0.00–0.50)

## 2016-11-10 MED ORDER — ATORVASTATIN CALCIUM 40 MG PO TABS: 40.0000 mg | ORAL_TABLET | ORAL | Status: DC

## 2016-11-10 MED ORDER — SENNA 8.6 MG PO TABS
1.0000 | ORAL_TABLET | Freq: Two times a day (BID) | ORAL | Status: DC
  Administered 2016-11-11: 8.6 mg via ORAL
  Filled 2016-11-10: qty 1

## 2016-11-10 MED ORDER — MEMANTINE HCL-DONEPEZIL HCL ER 7-10 MG PO CP24: 1.0000 | ORAL_CAPSULE | Freq: Every day | ORAL | Status: DC

## 2016-11-10 MED ORDER — DONEPEZIL HCL 5 MG PO TABS
10.0000 mg | ORAL_TABLET | Freq: Every day | ORAL | Status: DC
  Administered 2016-11-11: 10 mg via ORAL
  Filled 2016-11-10: qty 2

## 2016-11-10 MED ORDER — SODIUM CHLORIDE 0.9% FLUSH
3.0000 mL | Freq: Two times a day (BID) | INTRAVENOUS | Status: DC
  Administered 2016-11-11: 3 mL via INTRAVENOUS

## 2016-11-10 MED ORDER — BISACODYL 10 MG RE SUPP: 10.0000 mg | Freq: Every day | RECTAL | Status: DC | PRN

## 2016-11-10 MED ORDER — POLYETHYLENE GLYCOL 3350 17 G PO PACK: 17.0000 g | PACK | Freq: Every day | ORAL | Status: DC | PRN

## 2016-11-10 MED ORDER — ATORVASTATIN CALCIUM 40 MG PO TABS: 80.0000 mg | ORAL_TABLET | ORAL | Status: DC

## 2016-11-10 MED ORDER — LATANOPROST 0.005 % OP SOLN
1.0000 [drp] | Freq: Every day | OPHTHALMIC | Status: DC
  Administered 2016-11-11: 1 [drp] via OPHTHALMIC
  Filled 2016-11-10: qty 2.5

## 2016-11-10 MED ORDER — MEMANTINE HCL ER 7 MG PO CP24
7.0000 mg | ORAL_CAPSULE | Freq: Every day | ORAL | Status: DC
  Administered 2016-11-11: 7 mg via ORAL
  Filled 2016-11-10: qty 1

## 2016-11-10 MED ORDER — SODIUM CHLORIDE 0.9 % IV SOLN
INTRAVENOUS | Status: AC
  Administered 2016-11-11: via INTRAVENOUS

## 2016-11-10 NOTE — ED Notes (Signed)
Pt tolerating oral fluids; coke provided

## 2016-11-10 NOTE — ED Provider Notes (Signed)
[facility]-EMERGENCY DEPT Provider Note   CSN: [id number] Arrival date & time: 11/10/16  1806     History   Chief Complaint Chief Complaint  Patient presents with  . Weakness    HPI Erin Roberson is a 83 y.o. female.  PT is a 83yo female with a history of pulmonary embolus, on Coumadin, leukopenia, hyperlipidemia and coronary artery disease who presents with a near syncopal episode. She was at [employer] getting some high and that she was checking out at the register became lightheaded and felt like she was given a pass out. She collapsed down but did not completely black out. She denies any injuries from the episode. She did not hit her head. She denies any neck or back pain. She denies any chest pain although she states over last week she's had some intermittent feelings that her heart is been being. She denies any shortness of breath. She feels better now. She has no ongoing dizziness. There is no vertiginous-type symptoms. She denies any vision changes or slurred speech. No numbness or weakness to her extremities. No recent illnesses. No fevers coughing or cold symptoms other than she has had a mild cough. On reviewing her last cardiac records, she had a stress test last in 2011 which showed an EF of 60%. She has stress test in April 2017 which showed low risk for ischemia.      Past Medical History:  Diagnosis Date  . Arthritis   . Breast mass    left breast  . Clotting disorder (HCC)   . Coronary atherosclerosis   . Dementia   . Depression   . Diverticulosis of colon (without mention of hemorrhage)   . Fibromyalgia   . Glaucoma   . Hemorrhage of rectum and anus   . History of uterine cancer   . Iron deficiency anemia, unspecified   . Leukocytopenia, unspecified   . Leukopenia    chronic--benign  . Other pulmonary embolism and infarction    2011  . Overweight(278.02)   . Pure hypercholesterolemia   . Thyroid disease   . Unspecified cardiovascular disease   .  Urinary hesitancy   . UTI (lower urinary tract infection)     Patient Active Problem List   Diagnosis Date Noted  . Near syncope 11/10/2016  . Bradycardia 11/10/2016  . Chronic anticoagulation - Coumadin for history of PE 11/08/2015  . Encounter for therapeutic drug monitoring 10/30/2013  . Chronic LLQ pain 12/13/2011  . Dementia 12/13/2011  . Breast lesion, Left, 12 o'clock. 10/28/2011  . Constipation, chronic 08/09/2011  . Diverticulosis of colon 08/09/2011  . Long term current use of anticoagulant 11/18/2010  . PULMONARY EMBOLISM 08/15/2010  . HEMATURIA, HX OF 08/15/2010  . Palpitations 10/28/2009  . ORTHOSTATIC DIZZINESS 08/30/2009  . CHEST PAIN, ATYPICAL 08/30/2009  . UNSTEADY GAIT 08/17/2009  . HYPERCHOLESTEROLEMIA 12/18/2007  . LEUKOPENIA, MILD 12/18/2007  . DEPRESSION 12/18/2007  . Coronary atherosclerosis 12/18/2007    Past Surgical History:  Procedure Laterality Date  . ABDOMINAL HYSTERECTOMY    . CARDIAC CATHETERIZATION  07/25/10   luminal irregularities in multiple vessels, no sig stenosis, EF 60%  . CATARACT EXTRACTION  2004   right  . COLONOSCOPY  07/01/2007   diverticulosis  . CORONARY ANGIOPLASTY  1992   LAD  . KIDNEY SURGERY     stent placed  . NECK SURGERY    . thyroid needle aspiration  12/2007   right, hyperplastic nodule  . UPPER GASTROINTESTINAL ENDOSCOPY  07/16/2007  tortous esophagus with spasms    OB History    No data available       Home Medications    Prior to Admission medications   Medication Sig Start Date End Date Taking? Authorizing Provider  atorvastatin (LIPITOR) 80 MG tablet Take 80 mg by mouth daily at 6 PM. 1/2 tablet (total 40mg ) daily ON Ovidio Hanger [name] 04/15/13  Yes Laurey Morale, MD  latanoprost (XALATAN) 0.005 % ophthalmic solution Place 1 drop into both eyes at bedtime.    Yes Historical Provider, MD  NAMZARIC 7-10 MG CP24 Take 1 capsule by mouth daily. 10/06/16  Yes Historical Provider, MD  warfarin (COUMADIN)  5 MG tablet Take as directed by coumadin clinic Patient taking differently: Take 5 mg by mouth daily. Take 5mg  by mouth Monday, Wednesday, Thursday, Friday, Saturday, Sunday. Take 2.5 mg by mouth on Tuesday as directed by coumadin clinic. 07/11/16  Yes Laurey Morale, MD    Family History Family History  Problem Relation Age of Onset  . Diabetes Mother     deceased age 20  . Hypertension Mother   . Pancreatic cancer Father     deceased age 51  . Emphysema Brother     deceased  . Alcohol abuse Brother   . Osteoporosis    . Stroke Maternal Aunt   . Colon cancer Neg Hx   . Heart attack Neg Hx     Social History Social History  Substance Use Topics  . Smoking status: Never Smoker  . Smokeless tobacco: Never Used  . Alcohol use No     Allergies   Patient has no known allergies.   Review of Systems Review of Systems  Constitutional: Negative for chills, diaphoresis, fatigue and fever.  HENT: Negative for congestion, rhinorrhea and sneezing.   Eyes: Negative.   Respiratory: Negative for cough, chest tightness and shortness of breath.   Cardiovascular: Negative for chest pain and leg swelling.       Chest thumping  Gastrointestinal: Negative for abdominal pain, blood in stool, diarrhea, nausea and vomiting.  Genitourinary: Negative for difficulty urinating, flank pain, frequency and hematuria.  Musculoskeletal: Negative for arthralgias and back pain.  Skin: Negative for rash.  Neurological: Positive for light-headedness. Negative for dizziness, speech difficulty, weakness, numbness and headaches.     Physical Exam Updated Vital Signs BP (!) 152/53 (BP Location: Left Arm)   Pulse (!) 55   Temp 98.3 F (36.8 C) (Oral)   Resp 16   SpO2 100%   Physical Exam  Constitutional: She is oriented to person, place, and time. She appears well-developed and well-nourished.  HENT:  Head: Normocephalic and atraumatic.  Eyes: Pupils are equal, round, and reactive to light.    Neck: Normal range of motion. Neck supple.  Cardiovascular: Normal rate and regular rhythm.   Murmur heard. Pulmonary/Chest: Effort normal and breath sounds normal. No respiratory distress. She has no wheezes. She has no rales. She exhibits no tenderness.  Abdominal: Soft. Bowel sounds are normal. There is no tenderness. There is no rebound and no guarding.  Musculoskeletal: Normal range of motion. She exhibits no edema.  Lymphadenopathy:    She has no cervical adenopathy.  Neurological: She is alert and oriented to person, place, and time.  Motor 5/5 all extremities Sensation grossly intact to LT all extremities Finger to Nose intact, no pronator drift CN II-XII grossly intact    Skin: Skin is warm and dry. No rash noted.  Psychiatric: She has a normal mood  and affect.     ED Treatments / Results  Labs (all labs ordered are listed, but only abnormal results are displayed) Labs Reviewed  COMPREHENSIVE METABOLIC PANEL - Abnormal; Notable for the following:       Result Value   Glucose, Bld 119 (*)    Calcium 8.8 (*)    GFR calc non Af Amer 49 (*)    GFR calc Af Amer 56 (*)    All other components within normal limits  CBC WITH DIFFERENTIAL/PLATELET - Abnormal; Notable for the following:    WBC 3.7 (*)    All other components within normal limits  URINALYSIS, ROUTINE W REFLEX MICROSCOPIC - Abnormal; Notable for the following:    Color, Urine AMBER (*)    APPearance HAZY (*)    Bilirubin Urine SMALL (*)    Ketones, ur 5 (*)    Protein, ur 30 (*)    Leukocytes, UA MODERATE (*)    Squamous Epithelial / LPF 0-5 (*)    All other components within normal limits  PROTIME-INR - Abnormal; Notable for the following:    Prothrombin Time 15.4 (*)    All other components within normal limits  URINE CULTURE  TROPONIN I  I-STAT TROPOININ, ED    EKG  EKG Interpretation  Date/Time:  Saturday November 10 2016 19:00:21 EST Ventricular Rate:  63 PR Interval:    QRS  Duration: 88 QT Interval:  386 QTC Calculation: 396 R Axis:   1 Text Interpretation:  Sinus rhythm Probable anterior infarct, age indeterminate since last tracing no significant change Confirmed by Tasman Zapata  MD, Albie Arizpe ([id number]) on 11/10/2016 7:08:57 PM       Radiology Dg Chest 2 View  Result Date: 11/10/2016 CLINICAL DATA:  Weakness EXAM: CHEST  2 VIEW COMPARISON:  03/20/2012 FINDINGS: The right lung is grossly clear. Small left pleural effusion or thickening. No consolidation. Normal heart size. Atherosclerosis. No pneumothorax. IMPRESSION: Small left pleural effusion or thickening.  No acute infiltrate. Electronically Signed   By: Jasmine PangKim  Fujinaga M.D.   On: 11/10/2016 19:42    Procedures Procedures (including critical care time)  Medications Ordered in ED Medications - No data to display   Initial Impression / Assessment and Plan / ED Course  I have reviewed the triage vital signs and the nursing notes.  Pertinent labs & imaging results that were available during my care of the patient were reviewed by me and considered in my medical decision making (see chart for details).     Patient presents with a near syncopal type episode. She's currently asymptomatic. She has been persistently bradycardic in the ED with a heart rate in the 50s. She's not hypotensive. On review of her records her prior EKGs and office visits document heart rates in the 70s and 80s. It don't see that she's on a beta blocker. Her other labs are unremarkable. She's neurologically intact without suggestions of TIA/stroke. She has a history of PE and her INR is subtherapeutic but she doesn't have any other symptoms that would be more suggestive of pulmonary embolus. Her urine shows pyuria but there is no bacteria and negative nitrate. I spoke with Dr. Adela Glimpseoutova with the hospitalist service to admit the patient for further evaluation.  Final Clinical Impressions(s) / ED Diagnoses   Final diagnoses:  Near syncope   Bradycardia    New Prescriptions New Prescriptions   No medications on file     Rolan BuccoMelanie Charmon Thorson, MD 11/10/16 2143

## 2016-11-10 NOTE — ED Triage Notes (Signed)
Patient presents to [facility]-ED for weakness onset at [employer] where she was eating dinner. She says she consumed half of her fish sandwich and complained to the waitress that she 'felt weak allover'. Bystanders got involved and 911 was called. The patient has a history of Alzheimer's dementia and EMS was concerned for her to be driving alone, at night, in the rain and she lives a good distance from the [employer]. EMS was also concerned that she appeared somewhat diaphoretic and discovered she is wearing a nightgown underneath her regular clothes. Blood sugar 126. Upon arrival to ED patient is very concerned about her cream pie and wishes to return to [employer] to retrieve. Alert and oriented however.

## 2016-11-10 NOTE — H&P (Signed)
[name] ZOX:[id number] DOB: 1924-05-02 DOA: 11/10/2016     PCP: Erin Farber, MD   Outpatient Specialists: Cardiology [facility]  Patient coming from:    home Lives alone,        Chief Complaint: pre-syncope and palpitations  HPI: Erin Roberson is a 82 y.o. 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 Patient has history of dementia and EMS was concerned for patient to attempt to drive home they brought him to emergency department she appeared to be somewhat diaphoretic and was appearing to wear a nightgown underneath her regular close blood sugar was 126. On arrival to emergency department patient was perseverating on her cream pie and wishes to go back to [employer] to retrieve it. She does has history of mild dementia. Reports decreased appetite. No nausea vomiting or diarrhea.   Dates she has daughter who lives in [place]. Patient was stating that today she felt lightheaded and almost passed out but never completely blacked out. She did not hit her head she denies any chest pain currently although have had intermittent chronic chest pain per records. She's been having occasional palpitations this been happening couple times a day not associated shortness of breath she is currently feels back to her baseline denies any vertigo. No slurred speech no localizing neurological complaints no fevers or cough or cold symptoms. Regarding pertinent Chronic problems: Regarding history of coronary artery disease last cardiac catheterization 2011 nonobstructive coronary artery disease EF 60% Asian hadn't recurrent chest pain and had a stress test in April 2017 that showed no sign of ischemia. The PE felt to be due  to pathic since she was started on lifelong Coumadin she hasn't been compliant  IN ER:  Temp (24hrs), Avg:98.3 F (36.8 C), Min:98.3 F (36.8 C), Max:98.3 F (36.8 C)      Noted to be bradycardic down to 55 BP 152/53 Troponin 0.00 INR 1.2 to subtherapeutic  Sodium 138 creatinine 0.99  WBC 3.7 at her baseline, Hg 13.1 CXR small left pleural effusion versus thickening but nonacute  Following Medications were ordered in ER: Medications - No data to display   Hospitalist was called for admission for presyncope palpitation  Review of Systems:    Pertinent positives include: confusion palpitations  Constitutional:  No weight loss, night sweats, Fevers, chills, fatigue, weight loss  HEENT:  No headaches, Difficulty swallowing,Tooth/dental problems,Sore throat,  No sneezing, itching, ear ache, nasal congestion, post nasal drip,  Cardio-vascular:  No chest pain, Orthopnea, PND, anasarca, dizziness, .no Bilateral lower extremity swelling  GI:  No heartburn, indigestion, abdominal pain, nausea, vomiting, diarrhea, change in bowel habits, loss of appetite, melena, blood in stool, hematemesis Resp:  no shortness of breath at rest. No dyspnea on exertion, No excess mucus, no productive cough, No non-productive cough, No coughing up of blood.No change in color of mucus.No wheezing. Skin:  no rash or lesions. No jaundice GU:  no dysuria, change in color of urine, no urgency or frequency. No straining to urinate.  No flank pain.  Musculoskeletal:  No joint pain or no joint swelling. No decreased range of motion. No back pain.  Psych:  No change in mood  or affect. No depression or anxiety. No memory loss.  Neuro: no localizing neurological complaints, no tingling, no weakness, no double vision, no gait abnormality, no slurred speech, no confusion  As per HPI otherwise 10 point review of systems negative.   Past Medical History: Past Medical History:  Diagnosis Date  . Arthritis   .  Breast mass    left breast  . Clotting disorder (HCC)   . Coronary atherosclerosis   . Dementia   . Depression   . Diverticulosis of colon (without mention of hemorrhage)   . Fibromyalgia   . Glaucoma   . Hemorrhage of rectum and anus   . History of uterine cancer   . Iron deficiency anemia, unspecified   . Leukocytopenia, unspecified   . Leukopenia    chronic--benign  . Other pulmonary embolism and infarction    2011  . Overweight(278.02)   . Pure hypercholesterolemia   . Thyroid disease   . Unspecified cardiovascular disease   . Urinary hesitancy   . UTI (lower urinary tract infection)    Past Surgical History:  Procedure Laterality Date  . ABDOMINAL HYSTERECTOMY    . CARDIAC CATHETERIZATION  07/25/10   luminal irregularities in multiple vessels, no sig stenosis, EF 60%  . CATARACT EXTRACTION  2004   right  . COLONOSCOPY  07/01/2007   diverticulosis  . CORONARY ANGIOPLASTY  1992   LAD  . KIDNEY SURGERY     stent placed  . NECK SURGERY    . thyroid needle aspiration  12/2007   right, hyperplastic nodule  . UPPER GASTROINTESTINAL ENDOSCOPY  07/16/2007   tortous esophagus with spasms     Social History:  Ambulatory  independently      reports that she has never smoked. She has never used smokeless tobacco. She reports that she does not drink alcohol or use drugs.  Allergies:  No Known Allergies     Family History:   Family History  Problem Relation Age of Onset  . Diabetes Mother     deceased age 41  . Hypertension Mother   . Pancreatic cancer Father     deceased age 43  . Emphysema Brother     deceased  . Alcohol abuse Brother   . Osteoporosis    . Stroke Maternal Aunt   . Colon cancer Neg Hx   . Heart attack Neg Hx     Medications: Prior to Admission medications   Medication Sig Start Date End Date Taking? Authorizing Provider  atorvastatin (LIPITOR) 80 MG tablet Take 80 mg by mouth daily at 6 PM. 1/2 tablet (total 40mg ) daily ON Ovidio Hanger  [name] 04/15/13  Yes Laurey Morale, MD  latanoprost (XALATAN) 0.005 % ophthalmic solution Place 1 drop into both eyes at bedtime.    Yes Historical Provider, MD  NAMZARIC 7-10 MG CP24 Take 1 capsule by mouth daily. 10/06/16  Yes Historical Provider, MD  warfarin (COUMADIN) 5 MG tablet Take as directed by coumadin clinic Patient taking differently: Take 5 mg by mouth daily. Take 5mg  by mouth Monday, Wednesday, Thursday, Friday, Saturday, Sunday. Take 2.5 mg by mouth on Tuesday as directed by coumadin clinic. 07/11/16  Yes Laurey Morale, MD    Physical Exam: Patient Vitals for the past 24 hrs:  BP Temp Temp src Pulse Resp SpO2  11/10/16 2115 (!) 152/53 - - (!) 55 16 100 %  11/10/16 2030 - - - 61 20 100 %  11/10/16 1900 (!) 119/45 - - (!) 56 15  97 %  11/10/16 1823 126/55 98.3 F (36.8 C) Oral (!) 58 17 98 %    1. General:  in No Acute distress 2. Psychological: Alert and  Oriented 3. Head/ENT:    Dry Mucous Membranes                          Head Non traumatic, neck supple                            Poor Dentition 4. SKIN:   decreased Skin turgor,  Skin clean Dry and intact no rash 5. Heart: Regular rate and rhythm no Murmur, Rub or gallop 6. Lungs: no wheezes or crackles   7. Abdomen: Soft,   non-tender, Non distended 8. Lower extremities: no clubbing, cyanosis, or edema 9. Neurologically Grossly intact, moving all 4 extremities equally  10. MSK: Normal range of motion   body mass index is unknown because there is no height or weight on file.  Labs on Admission:   Labs on Admission: I have personally reviewed following labs and imaging studies  CBC:  Recent Labs Lab 11/10/16 1931  WBC 3.7*  NEUTROABS 2.5  HGB 13.1  HCT 39.2  MCV 94.0  PLT 184   Basic Metabolic Panel:  Recent Labs Lab 11/10/16 1931  NA 138  K 4.2  CL 105  CO2 28  GLUCOSE 119*  BUN 16  CREATININE 0.99  CALCIUM 8.8*   GFR: CrCl cannot be calculated (Unknown ideal weight.). Liver  Function Tests:  Recent Labs Lab 11/10/16 1931  AST 22  ALT 14  ALKPHOS 76  BILITOT 0.5  PROT 6.8  ALBUMIN 3.6   No results for input(s): LIPASE, AMYLASE in the last 168 hours. No results for input(s): AMMONIA in the last 168 hours. Coagulation Profile:  Recent Labs Lab 11/10/16 1938  INR 1.22   Cardiac Enzymes: No results for input(s): CKTOTAL, CKMB, CKMBINDEX, TROPONINI in the last 168 hours. BNP (last 3 results) No results for input(s): PROBNP in the last 8760 hours. HbA1C: No results for input(s): HGBA1C in the last 72 hours. CBG: No results for input(s): GLUCAP in the last 168 hours. Lipid Profile: No results for input(s): CHOL, HDL, LDLCALC, TRIG, CHOLHDL, LDLDIRECT in the last 72 hours. Thyroid Function Tests: No results for input(s): TSH, T4TOTAL, FREET4, T3FREE, THYROIDAB in the last 72 hours. Anemia Panel: No results for input(s): VITAMINB12, FOLATE, FERRITIN, TIBC, IRON, RETICCTPCT in the last 72 hours. Urine analysis:    Component Value Date/Time   COLORURINE AMBER (A) 11/10/2016 1958   APPEARANCEUR HAZY (A) 11/10/2016 1958   LABSPEC 1.030 11/10/2016 1958   PHURINE 5.0 11/10/2016 1958   GLUCOSEU NEGATIVE 11/10/2016 1958   GLUCOSEU NEGATIVE 12/02/2012 1552   HGBUR NEGATIVE 11/10/2016 1958   BILIRUBINUR SMALL (A) 11/10/2016 1958   KETONESUR 5 (A) 11/10/2016 1958   PROTEINUR 30 (A) 11/10/2016 1958   UROBILINOGEN 0.2 12/02/2012 1552   NITRITE NEGATIVE 11/10/2016 1958   LEUKOCYTESUR MODERATE (A) 11/10/2016 1958   Sepsis Labs: @LABRCNTIP (procalcitonin:4,lacticidven:4) )No results found for this or any previous visit (from the past 240 hour(s)).     UA 6-30 WBC no bacteria.   No results found for: HGBA1C  CrCl cannot be calculated (Unknown ideal weight.).  BNP (last 3 results) No results for input(s): PROBNP in the last 8760 hours.   ECG REPORT  Independently reviewed Rate: 63  Rhythm: SR ST&T Change:  FlatenedT waves QTC 393  There were no  vitals filed for this visit.   Cultures:    Component Value Date/Time   SDES URINE, CATHETERIZED 11/05/2009 1411   SPECREQUEST Vomiting 11/05/2009 1411   CULT NO GROWTH 11/05/2009 1411   REPTSTATUS 11/06/2009 FINAL 11/05/2009 1411     Radiological Exams on Admission: Dg Chest 2 View  Result Date: 11/10/2016 CLINICAL DATA:  Weakness EXAM: CHEST  2 VIEW COMPARISON:  03/20/2012 FINDINGS: The right lung is grossly clear. Small left pleural effusion or thickening. No consolidation. Normal heart size. Atherosclerosis. No pneumothorax. IMPRESSION: Small left pleural effusion or thickening.  No acute infiltrate. Electronically Signed   By: Jasmine Pang M.D.   On: 11/10/2016 19:42    Chart has been reviewed    Assessment/Plan  83 y.o. 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 Near syncope -  given risk factor will admit rehydrate obtain CE, given remote history of PE and subtherapeutic INR will check d-dimer, patient history is difficult to confirm, monitor on tele and obtain carotid dopplers,echogram Dementia continue home medications prior to discharge we'll need to assess for safety patient lives alone she has been perseverating and under not alert to situation suspect the patient should no longer be driving given degree of dementia. Need to discuss with family in the morning . Constipation, chronic - bowel regimen . Coronary atherosclerosis - cycle CE, recent stress test showing no evidence of ischemia . Palpitations - monitor on telemetry . Bradycardia - mild currently HR >60 unclear if contributing to presyncope.    Other plan as per orders.  DVT prophylaxis:   Lovenox     Code Status:  FULL CODE as per patient     Family Communication:   Family not  at  Bedside    Disposition Plan:     To home once workup  is complete and patient is stable                         Would benefit from PT/OT eval prior to DC   Ordered for safety Patient showing signs of dementia home safety need to be assessed prior to discharge                                                Consults called: none  Admission status:   obs   Level of care     tele            I have spent a total of 56 min on this admission   Santo Zahradnik 11/10/2016, 11:07 PM    [facility]  Pager [phone]   after 2 AM please page floor coverage PA If 7AM-7PM, please contact the day team taking care of the patient  [email or web address]  Password [email or web address]

## 2016-11-11 ENCOUNTER — Observation Stay (HOSPITAL_BASED_OUTPATIENT_CLINIC_OR_DEPARTMENT_OTHER): Payer: Medicare Other

## 2016-11-11 ENCOUNTER — Observation Stay (HOSPITAL_COMMUNITY): Payer: Medicare Other

## 2016-11-11 DIAGNOSIS — R001 Bradycardia, unspecified: Secondary | ICD-10-CM | POA: Diagnosis not present

## 2016-11-11 DIAGNOSIS — R55 Syncope and collapse: Secondary | ICD-10-CM | POA: Diagnosis not present

## 2016-11-11 DIAGNOSIS — I2510 Atherosclerotic heart disease of native coronary artery without angina pectoris: Secondary | ICD-10-CM

## 2016-11-11 DIAGNOSIS — Z7901 Long term (current) use of anticoagulants: Secondary | ICD-10-CM

## 2016-11-11 LAB — COMPREHENSIVE METABOLIC PANEL
ALBUMIN: 3.1 g/dL — AB (ref 3.5–5.0)
ALK PHOS: 62 U/L (ref 38–126)
ALT: 12 U/L — ABNORMAL LOW (ref 14–54)
AST: 18 U/L (ref 15–41)
Anion gap: 3 — ABNORMAL LOW (ref 5–15)
BILIRUBIN TOTAL: 0.3 mg/dL (ref 0.3–1.2)
BUN: 17 mg/dL (ref 6–20)
CO2: 29 mmol/L (ref 22–32)
Calcium: 8.5 mg/dL — ABNORMAL LOW (ref 8.9–10.3)
Chloride: 110 mmol/L (ref 101–111)
Creatinine, Ser: 0.94 mg/dL (ref 0.44–1.00)
GFR calc Af Amer: >60 mL/min (ref >60)
GFR calc non Af Amer: 52 mL/min — ABNORMAL LOW (ref >60)
GLUCOSE: 89 mg/dL (ref 65–99)
POTASSIUM: 4.1 mmol/L (ref 3.5–5.1)
SODIUM: 142 mmol/L (ref 135–145)
Total Protein: 5.9 g/dL — ABNORMAL LOW (ref 6.5–8.1)

## 2016-11-11 LAB — VAS US CAROTID
LCCADSYS: -104 cm/s
LCCAPDIAS: 20 cm/s
LEFT ECA DIAS: -24 cm/s
LEFT VERTEBRAL DIAS: 16 cm/s
LICADDIAS: -33 cm/s
LICAPDIAS: 15 cm/s
LICAPSYS: 60 cm/s
Left CCA dist dias: -18 cm/s
Left CCA prox sys: 109 cm/s
Left ICA dist sys: -111 cm/s
RCCAPDIAS: 18 cm/s
RCCAPSYS: 92 cm/s
RIGHT ECA DIAS: -7 cm/s
RIGHT VERTEBRAL DIAS: 10 cm/s
Right cca dist sys: -100 cm/s

## 2016-11-11 LAB — CBC WITH DIFFERENTIAL/PLATELET
BASOS ABS: 0 10*3/uL (ref 0.0–0.1)
BASOS PCT: 0 %
Eosinophils Absolute: 0.1 10*3/uL (ref 0.0–0.7)
Eosinophils Relative: 1 %
HEMATOCRIT: 34.7 % — AB (ref 36.0–46.0)
HEMOGLOBIN: 11.5 g/dL — AB (ref 12.0–15.0)
Lymphocytes Relative: 49 %
Lymphs Abs: 2.2 10*3/uL (ref 0.7–4.0)
MCH: 31.2 pg (ref 26.0–34.0)
MCHC: 33.1 g/dL (ref 30.0–36.0)
MCV: 94 fL (ref 78.0–100.0)
Monocytes Absolute: 0.5 10*3/uL (ref 0.1–1.0)
Monocytes Relative: 11 %
NEUTROS ABS: 1.8 10*3/uL (ref 1.7–7.7)
NEUTROS PCT: 39 %
Platelets: 176 10*3/uL (ref 150–400)
RBC: 3.69 MIL/uL — ABNORMAL LOW (ref 3.87–5.11)
RDW: 13.3 % (ref 11.5–15.5)
WBC: 4.6 10*3/uL (ref 4.0–10.5)

## 2016-11-11 LAB — TROPONIN I
Troponin I: <0.03 ng/mL (ref <0.03)
Troponin I: <0.03 ng/mL (ref <0.03)
Troponin I: <0.03 ng/mL (ref <0.03)

## 2016-11-11 LAB — ECHOCARDIOGRAM COMPLETE
Height: 64 in
Weight: 2119.94 oz

## 2016-11-11 LAB — MAGNESIUM: MAGNESIUM: 2.2 mg/dL (ref 1.7–2.4)

## 2016-11-11 LAB — PROTIME-INR
INR: 1.19
PROTHROMBIN TIME: 15.2 s (ref 11.4–15.2)

## 2016-11-11 LAB — PHOSPHORUS: PHOSPHORUS: 2.8 mg/dL (ref 2.5–4.6)

## 2016-11-11 MED ORDER — WARFARIN SODIUM 6 MG PO TABS
6.0000 mg | ORAL_TABLET | Freq: Once | ORAL | Status: DC
  Filled 2016-11-11: qty 1

## 2016-11-11 MED ORDER — WARFARIN SODIUM 5 MG PO TABS: 10.0000 mg | ORAL_TABLET | Freq: Once | ORAL | Status: DC

## 2016-11-11 MED ORDER — WARFARIN SODIUM 6 MG PO TABS
6.0000 mg | ORAL_TABLET | ORAL | Status: AC
  Administered 2016-11-11: 6 mg via ORAL
  Filled 2016-11-11: qty 1

## 2016-11-11 MED ORDER — WARFARIN - PHARMACIST DOSING INPATIENT: Freq: Every day | Status: DC

## 2016-11-11 NOTE — Discharge Summary (Signed)
Physician Discharge Summary  Erin Roberson ZOX:[id number] DOB: [birth year] DOA: 11/10/2016  PCP: Eino Farber, MD  Admit date: 11/10/2016 Discharge date: 11/11/2016  Admitted From: home  Disposition:  home     Home Health:  Ordered, PT, RN, Aid ordered Equipment/Devices:  walker    Discharge Condition:  stable   CODE STATUS:  Full code   Diet recommendation:  Heart healthy Consultations:  none    Discharge Diagnoses:  Principal Problem:   Near syncope Active Problems:   Coronary atherosclerosis   Constipation, chronic   Chronic anticoagulation - Coumadin for history of PE   Bradycardia    Subjective: Does not usually have near syncope. No recent flu like symptoms, nausea, vomiting or diarrhea.   Brief Summary: Erin Roberson is a 84 y.o. 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 She drove herself there today to the dinner she went up to pay for her pie and felt weak all over and light headed. Never passed out. No chest pain or dyspnea. Never has palpitations but occasionally feels a "thump" in her heart. No neurological symptoms.  911 was called. Blood sugar was 126  Hospital Course:  Near syncope - orthostatic vitals negative but U specific gravity elevated and suspicious for dehydration - telemetry negative - 3 sets of troponin negative -  D dimer only mildly elevated - on Coumadin for h/o PE but INR subtherapeutic - 2 D ECHO shows only grade 1 dCHF - carotid duplex negative - CT head unrevealing  Mild bradycardia - HR in 60s at rest - not concerning  CAD s/p PTCA of LAD - on Coumadin (for PE) and not Aspirin by cardiology - see notes from Dr Riley Kill, 2014 - no on B blocker - cont statin   H/o idiopathic PE - cont coumadin - INR subtherapeutic - will give a dose of 10 mg today and tomorrow and  then back to home dose with INR check in 4 days  Mild dementia - cont Namzaric  Discharge Instructions  Discharge Instructions    Diet - low sodium heart healthy    Complete by:  As directed    Discharge instructions    Complete by:  As directed    Take 10 mg of Coumadin today and tomorrow and have your level checked on Wednesday at your family doctor's office.   Increase activity slowly    Complete by:  As directed      Allergies as of 11/11/2016   No Known Allergies     Medication List    TAKE these medications   atorvastatin 80 MG tablet Commonly known as:  LIPITOR Take 80 mg by mouth daily at 6 PM. 1/2 tablet (total 40mg ) daily ON TUESDAYS /SATURDAY   latanoprost 0.005 % ophthalmic solution Commonly known as:  XALATAN Place 1 drop into both eyes at bedtime.   NAMZARIC 7-10 MG Cp24 Generic drug:  Memantine HCl-Donepezil HCl Take 1 capsule by mouth daily.   warfarin 5 MG tablet Commonly known as:  COUMADIN Take as directed by coumadin clinic What changed:  how much to take  how to take this  when to take this  additional instructions      Follow-up Information    KILPATRICK JR,GEORGE R, MD Follow up.   Specialty:  Pulmonary Disease Why:  on Wednesday for INR check  Contact information: [address] [place] [address] [phone]          No Known Allergies   Procedures/Studies: 2 D ECHO Left ventricle: The cavity size was normal. Systolic function was   normal. The estimated ejection fraction was in the range of 60%   to 65%. Wall motion was normal; there were no regional wall   motion abnormalities. Doppler parameters are consistent with   abnormal left ventricular relaxation (grade 1 diastolic   dysfunction). - Aortic valve: Trileaflet; mildly thickened, mildly calcified   leaflets. There was mild regurgitation. - Left atrium: The atrium was at the upper limits of normal in   size. Volume/bsa, ES, (1-plane Simpson&'s, A2C):  33 ml/m^2. Vascular Ultrasound Carotid Duplex (Doppler) has been completed.   Findings suggest 1-39% internal carotid artery stenosis bilaterally. Vertebral arteries are patent with antegrade flow.  Dg Chest 2 View  Result Date: 11/10/2016 CLINICAL DATA:  Weakness EXAM: CHEST  2 VIEW COMPARISON:  03/20/2012 FINDINGS: The right lung is grossly clear. Small left pleural effusion or thickening. No consolidation. Normal heart size. Atherosclerosis. No pneumothorax. IMPRESSION: Small left pleural effusion or thickening.  No acute infiltrate. Electronically Signed   By: Jasmine Pang M.D.   On: 11/10/2016 19:42   Ct Head Wo Contrast  Result Date: 11/11/2016 CLINICAL DATA:  Near syncope. EXAM: CT HEAD WITHOUT CONTRAST TECHNIQUE: Contiguous axial images were obtained from the base of the skull through the vertex without intravenous contrast. COMPARISON:  11/05/2009. FINDINGS: Brain: No evidence of acute infarction, hemorrhage, hydrocephalus, extra-axial collection or mass lesion/mass effect. Atrophy with small vessel disease, not unexpected for age. Vascular: No features to suggest emergent large vessel occlusion. Remarkably little calcification in the carotid siphons. Skull: Normal. Negative for fracture or focal lesion. Sinuses/Orbits: No acute finding. Other: None. IMPRESSION: Atrophy and small vessel disease similar to priors. No acute intracranial findings. Electronically Signed   By: Elsie Stain M.D.   On: 11/11/2016 14:12       Discharge Exam: Vitals:   11/11/16 0438 11/11/16 1448  BP: (!) 151/65 (!) 107/41  Pulse: (!) 56 68  Resp: 20 20  Temp: 97.8 F (36.6 C) 98.9 F (37.2 C)   Vitals:   11/10/16 2115 11/10/16 2345 11/11/16 0438 11/11/16 1448  BP: (!) 152/53 (!) 160/63 (!) 151/65 (!) 107/41  Pulse: (!) 55 (!) 58 (!) 56 68  Resp: 16 18 20 20   Temp:  98.1 F (36.7 C) 97.8 F (36.6 C) 98.9 F (37.2 C)  TempSrc:  Oral Oral Oral  SpO2: 100% 100% 100% 97%  Weight:  59.3 kg (130 lb  11.7 oz) 60.1 kg (132 lb 7.9 oz)   Height:  5\' 4"  (1.626 m)      General: Pt is alert, awake, not in acute distress Cardiovascular: RRR, S1/S2 +, no rubs, no gallops Respiratory: CTA bilaterally, no wheezing, no rhonchi Abdominal: Soft, NT, ND, bowel sounds + Extremities: no edema, no cyanosis    The results of significant diagnostics from this hospitalization (including imaging, microbiology, ancillary and laboratory) are listed below for reference.     Microbiology: No results found for this or any previous visit (from the past 240 hour(s)).   Labs: BNP (last 3 results) No results for input(s): BNP in the last 8760 hours. Basic Metabolic Panel:  Recent Labs Lab 11/10/16 1931 11/10/16 2300 11/11/16 0429  NA 138  --  142  K 4.2  --  4.1  CL 105  --  110  CO2 28  --  29  GLUCOSE 119*  --  89  BUN 16  --  17  CREATININE 0.99  --  0.94  CALCIUM 8.8*  --  8.5*  MG  --  2.2  --   PHOS  --  2.8  --    Liver Function Tests:  Recent Labs Lab 11/10/16 1931 11/11/16 0429  AST 22 18  ALT 14 12*  ALKPHOS 76 62  BILITOT 0.5 0.3  PROT 6.8 5.9*  ALBUMIN 3.6 3.1*   No results for input(s): LIPASE, AMYLASE in the last 168 hours. No results for input(s): AMMONIA in the last 168 hours. CBC:  Recent Labs Lab 11/10/16 1931 11/11/16 0429  WBC 3.7* 4.6  NEUTROABS 2.5 1.8  HGB 13.1 11.5*  HCT 39.2 34.7*  MCV 94.0 94.0  PLT 184 176   Cardiac Enzymes:  Recent Labs Lab 11/10/16 2203 11/11/16 0106 11/11/16 0702 11/11/16 1234  TROPONINI <0.03 <0.03 <0.03 <0.03   BNP: Invalid input(s): POCBNP CBG: No results for input(s): GLUCAP in the last 168 hours. D-Dimer  Recent Labs  11/10/16 2000  DDIMER 0.57*   Hgb A1c No results for input(s): HGBA1C in the last 72 hours. Lipid Profile No results for input(s): CHOL, HDL, LDLCALC, TRIG, CHOLHDL, LDLDIRECT in the last 72 hours. Thyroid function studies No results for input(s): TSH, T4TOTAL, T3FREE, THYROIDAB in the  last 72 hours.  Invalid input(s): FREET3 Anemia work up No results for input(s): VITAMINB12, FOLATE, FERRITIN, TIBC, IRON, RETICCTPCT in the last 72 hours. Urinalysis    Component Value Date/Time   COLORURINE AMBER (A) 11/10/2016 1958   APPEARANCEUR HAZY (A) 11/10/2016 1958   LABSPEC 1.030 11/10/2016 1958   PHURINE 5.0 11/10/2016 1958   GLUCOSEU NEGATIVE 11/10/2016 1958   GLUCOSEU NEGATIVE 12/02/2012 1552   HGBUR NEGATIVE 11/10/2016 1958   BILIRUBINUR SMALL (A) 11/10/2016 1958   KETONESUR 5 (A) 11/10/2016 1958   PROTEINUR 30 (A) 11/10/2016 1958   UROBILINOGEN 0.2 12/02/2012 1552   NITRITE NEGATIVE 11/10/2016 1958   LEUKOCYTESUR MODERATE (A) 11/10/2016 1958   Sepsis Labs Invalid input(s): PROCALCITONIN,  WBC,  LACTICIDVEN Microbiology No results found for this or any previous visit (from the past 240 hour(s)).   Time coordinating discharge: Over 30 minutes  SIGNED:   Calvert CantorIZWAN,Kimberlyann Hollar, MD  [facility] 11/11/2016, 3:21 PM Pager   If 7PM-7AM, please contact night-coverage [email or web address] Password [email or web address]

## 2016-11-11 NOTE — Progress Notes (Signed)
*  PRELIMINARY RESULTS* Vascular Ultrasound Carotid Duplex (Doppler) has been completed.   Findings suggest 1-39% internal carotid artery stenosis bilaterally. Vertebral arteries are patent with antegrade flow.  11/11/2016 9:53 AM Gertie FeyMichelle Mikael Skoda, BS, RVT, RDCS, RDMS

## 2016-11-11 NOTE — Care Management Note (Signed)
Case Management Note  Patient Details  Name: [name] MRN: [id number] Date of Birth: Jun 16, 1924  Subjective/Objective:   Near syncope                 Action/Plan: Discharge Planning: AVS reviewed:  NCM spoke to pt and offered choice for HH/list provided. [facility]. Pt states she still drives to her appts. No problems getting meds. States her friend will pick her up from hospital to take her home.   PCP  Corine ShelterKILPATRICK, GEORGE MD  Expected Discharge Date:  11/11/16               Expected Discharge Plan:  Home w Home Health Services  In-House Referral:  NA  Discharge planning Services  CM Consult  Post Acute Care Choice:  Home Health Choice offered to:  Patient  DME Arranged:  N/A DME Agency:  NA  HH Arranged:  RN, PT, Nurse's Aide HH Agency:  [facility]  Status of Service:  Completed, signed off  If discussed at [employer] Meetings, dates discussed:    Additional Comments:  Elliot CousinShavis, Ridhi Hoffert Ellen, RN 11/11/2016, 4:56 PM

## 2016-11-11 NOTE — Progress Notes (Signed)
ANTICOAGULATION CONSULT NOTE - Follow Up Consult  Pharmacy Consult for Warfarin Indication: pulmonary embolus  No Known Allergies  Patient Measurements: Height: 5\' 4"  (162.6 cm) Weight: 132 lb 7.9 oz (60.1 kg) IBW/kg (Calculated) : 54.7  Vital Signs: Temp: 97.8 F (36.6 C) (02/11 0438) Temp Source: Oral (02/11 0438) BP: 151/65 (02/11 0438) Pulse Rate: 56 (02/11 0438)  Labs:  Recent Labs  11/10/16 1931 11/10/16 1938 11/10/16 2203 11/11/16 0106 11/11/16 0429 11/11/16 0702  HGB 13.1  --   --   --  11.5*  --   HCT 39.2  --   --   --  34.7*  --   PLT 184  --   --   --  176  --   LABPROT  --  15.4*  --   --   --   --   INR  --  1.22  --   --   --   --   CREATININE 0.99  --   --   --  0.94  --   TROPONINI  --   --  <0.03 <0.03  --  <0.03    Estimated Creatinine Clearance: 34.3 mL/min (by C-G formula based on SCr of 0.94 mg/dL).   Assessment: [age] yoF admitted on 2/10 with pre-syncope and palpitations.  PMH includes CAD s/p PCTA LAD, HLD, uterine cancer, dementia, and idiopathic PE on chronic warfarin anticoagulation.  Pharmacy is consulted to continue warfarin dosing inpatient.   PTA warfarin dose 5mg  daily except 2.5mg  on Tuesdays.  Last dose taken on 2/9 at 19:00.  Admission INR 1.22 (Anticoag clinic notes indicate that she has had therapeutic INRs on this dose for several months.  Most recent INR 2.5 on 1/30)  Today, 11/11/2016:  INR 1.19  (AM labs with decreased INR will not reflect most recent dose 6mg  given 2/11 00:27)  CBC: Hgb 11.5, Plt 176  No major drug-drug interactions  Diet: heart healthy   Goal of Therapy:  INR 2-3 Monitor platelets by anticoagulation protocol: Yes   Plan:   Warfarin 6 mg PO x 1 at 1800  Daily PT/INR.  Monitor for signs and symptoms of bleeding.  Consider bridge if INR remains < 2 for prolonged period.   Lynann Beaverhristine Cyncere Sontag PharmD, BCPS Pager [phone] 11/11/2016 8:07 AM

## 2016-11-11 NOTE — Care Management Obs Status (Signed)
MEDICARE OBSERVATION STATUS NOTIFICATION   Patient Details  Name: Erin Roberson MRN: [id number] Date of Birth: 03/11/1924   Medicare Observation Status Notification Given:  Yes    Elliot CousinShavis, Giamarie Bueche Ellen, RN 11/11/2016, 4:49 PM

## 2016-11-11 NOTE — Progress Notes (Signed)
ANTICOAGULATION CONSULT NOTE - Initial Consult  Pharmacy Consult for Warfarin Indication: pulmonary embolus  No Known Allergies  Patient Measurements: Height: 5\' 4"  (162.6 cm) Weight: 130 lb 11.7 oz (59.3 kg) IBW/kg (Calculated) : 54.7   Vital Signs: Temp: 98.1 F (36.7 C) (02/10 2345) Temp Source: Oral (02/10 2345) BP: 160/63 (02/10 2345) Pulse Rate: 58 (02/10 2345)  Labs:  Recent Labs  11/10/16 1931 11/10/16 1938 11/10/16 2203  HGB 13.1  --   --   HCT 39.2  --   --   PLT 184  --   --   LABPROT  --  15.4*  --   INR  --  1.22  --   CREATININE 0.99  --   --   TROPONINI  --   --  <0.03    Estimated Creatinine Clearance: 32.6 mL/min (by C-G formula based on SCr of 0.99 mg/dL).   Medical History: Past Medical History:  Diagnosis Date  . Arthritis   . Breast mass    left breast  . Clotting disorder (HCC)   . Coronary atherosclerosis   . Dementia   . Depression   . Diverticulosis of colon (without mention of hemorrhage)   . Fibromyalgia   . Glaucoma   . Hemorrhage of rectum and anus   . History of uterine cancer   . Iron deficiency anemia, unspecified   . Leukocytopenia, unspecified   . Leukopenia    chronic--benign  . Other pulmonary embolism and infarction    2011  . Overweight(278.02)   . Pure hypercholesterolemia   . Thyroid disease   . Unspecified cardiovascular disease   . Urinary hesitancy   . UTI (lower urinary tract infection)     Medications:  Scheduled:  . [START ON 11/13/2016] atorvastatin  40 mg Oral Once per day on Tue Sat  . atorvastatin  80 mg Oral Once per day on Sun Mon Wed Thu Fri  . donepezil  10 mg Oral Daily  . latanoprost  1 drop Both Eyes QHS  . memantine  7 mg Oral Daily  . senna  1 tablet Oral BID  . sodium chloride flush  3 mL Intravenous Q12H  . warfarin  6 mg Oral NOW  . Warfarin - Pharmacist Dosing Inpatient   Does not apply q1800   Infusions:  . sodium chloride      Assessment: 89 yoF on chronic warfarin for  idiopathic PE c/o pre-syncope and palpitations.  HD 5mg  daily except 2.5 on Sundays.  LD 2/10 , INR-1.22 on admission   Goal of Therapy:  INR 2-3    Plan:  Warfarin 6 mg x1 now Daily PT/INR- will get 1st INR 1300 with last troponin  Susanne GreenhouseGreen, Marene Gilliam R 11/11/2016,12:17 AM

## 2016-11-11 NOTE — Progress Notes (Signed)
  Echocardiogram 2D Echocardiogram has been performed.  Erin Roberson, Tony 11/11/2016, 11:46 AM

## 2016-11-11 NOTE — Evaluation (Signed)
Physical Therapy Evaluation Patient Details Name: Erin Roberson MRN: [id number] DOB: [birth year] Today's Date: 11/11/2016   History of Present Illness  Erin Roberson is a 83 y.o. 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 Pt currently with functional limitations due to the deficits listed below (see PT Problem List).  Pt will benefit from skilled PT to increase their independence and safety with mobility to allow discharge to the venue listed below.  Pt with dementia per chart but A&O x3 during PT eval, with only higher level balance deficits, will follow in acute setting;pt has very little home support per our discussion, certainly there are safety concerns, pt is still driving and living alone-- recommend HHPT     Follow Up Recommendations Home health PT    Equipment Recommendations  None recommended by PT    Recommendations for Other Services       Precautions / Restrictions Precautions Precautions: Fall Restrictions Weight Bearing Restrictions: No      Mobility  Bed Mobility Overal bed mobility: Needs Assistance Bed Mobility: Supine to Sit     Supine to sit: Supervision;Modified independent (Device/Increase time)     General bed mobility comments: no physical assist, HOB elevated slightly, supervision only for safety  Transfers Overall transfer level: Needs assistance   Transfers: Sit to/from Stand Sit to Stand: Supervision;Min guard         General transfer comment: for safety and line management   Ambulation/Gait Ambulation/Gait assistance: Min guard Ambulation Distance (Feet): 160 Feet (+20' more in room) Assistive device: None Gait Pattern/deviations: Step-through pattern;Trunk flexed;Decreased stride length;Narrow base of  support     General Gait Details: intermittent cues for safety and posture; pt is unsteady for initial 20' but no overt LOB; she is able to maneuver in room with min guard for safety  Stairs            Wheelchair Mobility    Modified Rankin (Stroke Patients Only)       Balance Overall balance assessment: Needs assistance (denies falls prior to syncopal episode) Sitting-balance support: No upper extremity supported;Feet supported Sitting balance-[name]: Good     Standing balance support: No upper extremity supported;During functional activity Standing balance-[name]: Good Standing balance comment: maneuvers in room/smaller space and stands to fold clothes iwthout LOB, partial BERG below                 Standardized Balance Assessment Standardized Balance Assessment : Berg Balance Test Berg Balance Test Sit to Stand: Able to stand without using hands and stabilize independently Sitting with Back Unsupported but Feet Supported on Floor or Stool: Able to sit safely and securely 2 minutes Stand to Sit: Sits safely with minimal use of hands Transfers: Able to transfer safely, minor use of hands From Standing, Reach Forward with Outstretched Arm: Can reach confidently >25 cm (10") From Standing Position, Pick up Object from Floor: Able to pick up shoe, needs supervision Turn 360 Degrees: Able to turn 360 degrees safely in 4 seconds or less         Pertinent Vitals/Pain Pain Assessment: No/denies pain    Home Living Family/patient expects to be discharged to:: Private residence Living Arrangements: Alone   Type of Home: House Home Access: Stairs to  enter   Entrance Stairs-Number of Steps: 2 Home Layout: One level Home Equipment: Cane - single point;Walker - standard Additional Comments: DME belonged to pt mother    Prior Function Level of Independence: Independent         Comments: pt drives      Hand Dominance        Extremity/Trunk  Assessment   Upper Extremity Assessment Upper Extremity Assessment: Overall WFL for tasks assessed;Defer to OT evaluation    Lower Extremity Assessment Lower Extremity Assessment: Overall WFL for tasks assessed       Communication   Communication: No difficulties  Cognition Arousal/Alertness: Awake/alert Behavior During Therapy: WFL for tasks assessed/performed Overall Cognitive Status: Within Functional Limits for tasks assessed Area of Impairment: Following commands       Following Commands: Follows one step commands consistently;Follows multi-step commands consistently       General Comments: hx of dementia per chart; pt stands and folds her clothes during PT session and they appear generally unkempt; pt is oriented to person, place, time and situation during PT eval;     General Comments      Exercises     Assessment/Plan    PT Assessment Patient needs continued PT services  PT Problem List Decreased activity tolerance;Decreased mobility;Decreased balance          PT Treatment Interventions DME instruction;Gait training;Functional mobility training;Balance training;Therapeutic exercise;Patient/family education    PT Goals (Current goals can be found in the Care Plan section)  Acute Rehab PT Goals Patient Stated Goal:  back home, to plant flowers for spring PT Goal Formulation: With patient Time For Goal Achievement: 11/18/16 Potential to Achieve Goals: Good    Frequency Min 3X/week   Barriers to discharge Decreased caregiver support      Co-evaluation               End of Session Equipment Utilized During Treatment: Gait belt Activity Tolerance: Patient tolerated treatment well Patient left: in chair;with call bell/phone within reach;with chair alarm set      Functional Assessment Tool Used: clinical judgement Functional Limitation: Mobility: Walking and moving around Mobility: Walking and Moving Around Current Status [phone]): At least 1  percent but less than 20 percent impaired, limited or restricted Mobility: Walking and Moving Around Goal Status [phone]): At least 1 percent but less than 20 percent impaired, limited or restricted    Time: 1139-1158 PT Time Calculation (min) (ACUTE ONLY): 19 min   Charges:   PT Evaluation $PT Eval Low Complexity: 1 Procedure     PT G Codes:   PT G-Codes **NOT FOR INPATIENT CLASS** Functional Assessment Tool Used: clinical judgement Functional Limitation: Mobility: Walking and moving around Mobility: Walking and Moving Around Current Status ([id number]): At least 1 percent but less than 20 percent impaired, limited or restricted Mobility: Walking and Moving Around Goal Status [phone]): At least 1 percent but less than 20 percent impaired, limited or restricted    [facility] 11/11/2016, 1:51 PM

## 2016-11-12 LAB — URINE CULTURE

## 2016-11-27 ENCOUNTER — Other Ambulatory Visit: Payer: Self-pay | Admitting: *Deleted

## 2016-11-27 ENCOUNTER — Ambulatory Visit (INDEPENDENT_AMBULATORY_CARE_PROVIDER_SITE_OTHER): Payer: Medicare Other | Admitting: *Deleted

## 2016-11-27 DIAGNOSIS — Z5181 Encounter for therapeutic drug level monitoring: Secondary | ICD-10-CM

## 2016-11-27 LAB — POCT INR: INR: 1.5

## 2016-11-27 NOTE — Telephone Encounter (Signed)
Pharmacy requests ninety day. 

## 2016-11-28 MED ORDER — WARFARIN SODIUM 5 MG PO TABS: ORAL_TABLET | ORAL | 3 refills | Status: DC

## 2016-11-29 ENCOUNTER — Other Ambulatory Visit: Payer: Self-pay | Admitting: *Deleted

## 2016-11-29 MED ORDER — WARFARIN SODIUM 5 MG PO TABS: ORAL_TABLET | ORAL | 3 refills | Status: DC

## 2016-11-29 NOTE — Telephone Encounter (Signed)
Pharmacy request ninety day. 

## 2016-12-07 ENCOUNTER — Ambulatory Visit (INDEPENDENT_AMBULATORY_CARE_PROVIDER_SITE_OTHER): Payer: Medicare Other | Admitting: *Deleted

## 2016-12-07 DIAGNOSIS — Z5181 Encounter for therapeutic drug level monitoring: Secondary | ICD-10-CM | POA: Diagnosis not present

## 2016-12-07 LAB — POCT INR: INR: 2.6

## 2016-12-11 ENCOUNTER — Ambulatory Visit (HOSPITAL_COMMUNITY)
Admission: AD | Admit: 2016-12-11 | Discharge: 2016-12-11 | Disposition: A | Discharge status: Home / Self Care | Payer: Medicare Other | Source: Ambulatory Visit | Attending: Pulmonary Disease | Admitting: Pulmonary Disease

## 2016-12-11 ENCOUNTER — Other Ambulatory Visit (HOSPITAL_COMMUNITY): Payer: Self-pay | Admitting: Pulmonary Disease

## 2016-12-11 ENCOUNTER — Ambulatory Visit (HOSPITAL_COMMUNITY)
Admission: RE | Admit: 2016-12-11 | Discharge: 2016-12-11 | Disposition: A | Discharge status: Home / Self Care | Payer: Medicare Other | Source: Ambulatory Visit | Attending: Pulmonary Disease | Admitting: Pulmonary Disease

## 2016-12-11 DIAGNOSIS — R918 Other nonspecific abnormal finding of lung field: Secondary | ICD-10-CM | POA: Insufficient documentation

## 2016-12-11 DIAGNOSIS — R05 Cough: Secondary | ICD-10-CM | POA: Insufficient documentation

## 2016-12-11 DIAGNOSIS — J984 Other disorders of lung: Secondary | ICD-10-CM | POA: Diagnosis not present

## 2016-12-11 DIAGNOSIS — R0981 Nasal congestion: Secondary | ICD-10-CM

## 2016-12-11 DIAGNOSIS — R059 Cough, unspecified: Secondary | ICD-10-CM

## 2016-12-28 ENCOUNTER — Ambulatory Visit (INDEPENDENT_AMBULATORY_CARE_PROVIDER_SITE_OTHER): Payer: Medicare Other | Admitting: *Deleted

## 2016-12-28 DIAGNOSIS — Z5181 Encounter for therapeutic drug level monitoring: Secondary | ICD-10-CM | POA: Diagnosis not present

## 2016-12-28 LAB — POCT INR: INR: 1.5

## 2017-01-16 ENCOUNTER — Ambulatory Visit (INDEPENDENT_AMBULATORY_CARE_PROVIDER_SITE_OTHER): Payer: Medicare Other | Admitting: *Deleted

## 2017-01-16 DIAGNOSIS — Z5181 Encounter for therapeutic drug level monitoring: Secondary | ICD-10-CM | POA: Diagnosis not present

## 2017-01-16 LAB — POCT INR: INR: 2.2

## 2017-03-07 ENCOUNTER — Ambulatory Visit (INDEPENDENT_AMBULATORY_CARE_PROVIDER_SITE_OTHER): Payer: Medicare Other

## 2017-03-07 DIAGNOSIS — Z5181 Encounter for therapeutic drug level monitoring: Secondary | ICD-10-CM

## 2017-03-07 LAB — POCT INR: INR: 3.3

## 2017-03-26 ENCOUNTER — Ambulatory Visit (INDEPENDENT_AMBULATORY_CARE_PROVIDER_SITE_OTHER): Payer: Medicare Other | Admitting: Pharmacist

## 2017-03-26 DIAGNOSIS — Z5181 Encounter for therapeutic drug level monitoring: Secondary | ICD-10-CM

## 2017-03-26 LAB — POCT INR: INR: 1.2

## 2017-04-04 ENCOUNTER — Ambulatory Visit (INDEPENDENT_AMBULATORY_CARE_PROVIDER_SITE_OTHER): Payer: Medicare Other | Admitting: *Deleted

## 2017-04-04 DIAGNOSIS — Z5181 Encounter for therapeutic drug level monitoring: Secondary | ICD-10-CM | POA: Diagnosis not present

## 2017-04-04 LAB — POCT INR: INR: 2.2

## 2017-04-04 MED ORDER — WARFARIN SODIUM 5 MG PO TABS: ORAL_TABLET | ORAL | 1 refills | Status: DC

## 2017-04-22 ENCOUNTER — Ambulatory Visit (INDEPENDENT_AMBULATORY_CARE_PROVIDER_SITE_OTHER): Payer: Medicare Other | Admitting: *Deleted

## 2017-04-22 DIAGNOSIS — Z5181 Encounter for therapeutic drug level monitoring: Secondary | ICD-10-CM

## 2017-04-22 LAB — POCT INR: INR: 2.3

## 2017-05-13 ENCOUNTER — Ambulatory Visit (INDEPENDENT_AMBULATORY_CARE_PROVIDER_SITE_OTHER): Payer: Medicare Other

## 2017-05-13 DIAGNOSIS — Z5181 Encounter for therapeutic drug level monitoring: Secondary | ICD-10-CM | POA: Diagnosis not present

## 2017-05-13 LAB — POCT INR: INR: 1.8

## 2017-07-13 ENCOUNTER — Other Ambulatory Visit: Payer: Self-pay | Admitting: Cardiology

## 2017-07-16 ENCOUNTER — Other Ambulatory Visit: Payer: Self-pay | Admitting: Cardiology

## 2017-07-16 NOTE — Telephone Encounter (Signed)
Overdue for follow-up missed appt on 06/05/17, last seen 05/13/17, pt has appt on 07/22/17.  Will refill once pt INR checked.

## 2017-07-22 ENCOUNTER — Ambulatory Visit (INDEPENDENT_AMBULATORY_CARE_PROVIDER_SITE_OTHER): Payer: Medicare Other

## 2017-07-22 DIAGNOSIS — I2699 Other pulmonary embolism without acute cor pulmonale: Secondary | ICD-10-CM

## 2017-07-22 DIAGNOSIS — Z5181 Encounter for therapeutic drug level monitoring: Secondary | ICD-10-CM

## 2017-07-22 LAB — POCT INR: INR: 2

## 2017-08-19 ENCOUNTER — Ambulatory Visit (INDEPENDENT_AMBULATORY_CARE_PROVIDER_SITE_OTHER): Payer: Medicare Other | Admitting: *Deleted

## 2017-08-19 DIAGNOSIS — Z5181 Encounter for therapeutic drug level monitoring: Secondary | ICD-10-CM

## 2017-08-19 DIAGNOSIS — I2699 Other pulmonary embolism without acute cor pulmonale: Secondary | ICD-10-CM | POA: Diagnosis not present

## 2017-08-19 LAB — POCT INR: INR: 1.3

## 2017-08-19 NOTE — Patient Instructions (Signed)
Take 1.5 tablet today, tomorrow take 1 tablet, then resume same dosage 1 tablet daily except 1/2 tablet on Tuesdays. Recheck in one week.  [facility] [phone]

## 2017-08-29 ENCOUNTER — Telehealth: Payer: Self-pay | Admitting: *Deleted

## 2017-08-29 NOTE — Telephone Encounter (Signed)
Pt calls to say she is sick and can't come in today for her appt. Informed the pt that her last INR was very low and could increase the risk of clot formation as she was subtherapeutic. She understands and wants to call back to schedule. After further education she did agree to come in on Monday but she wouldn't come tomorrow. Dose reviewed with pt as she states she has missed a dosage. Also encouraged her to follow up with her PCP as she is feeling sick and weak. Inform us on Monday of any medication changes.

## 2017-09-02 ENCOUNTER — Ambulatory Visit (INDEPENDENT_AMBULATORY_CARE_PROVIDER_SITE_OTHER): Payer: Medicare Other | Admitting: *Deleted

## 2017-09-02 DIAGNOSIS — Z5181 Encounter for therapeutic drug level monitoring: Secondary | ICD-10-CM | POA: Diagnosis not present

## 2017-09-02 LAB — POCT INR: INR: 4

## 2017-09-02 NOTE — Patient Instructions (Signed)
Do not take coumadin today Dec 3rd then continue  same dosage 1 tablet daily except 1/2 tablet on Tuesdays. Recheck in 2 weeks.  [facility]in Clinic [phone] Eat good serving of greens today then keep intake of greens consistent

## 2017-09-16 ENCOUNTER — Ambulatory Visit (INDEPENDENT_AMBULATORY_CARE_PROVIDER_SITE_OTHER): Payer: Medicare Other | Admitting: Pharmacist

## 2017-09-16 DIAGNOSIS — Z5181 Encounter for therapeutic drug level monitoring: Secondary | ICD-10-CM

## 2017-09-16 LAB — POCT INR: INR: 2

## 2017-09-16 NOTE — Patient Instructions (Signed)
Description   Continue same dosage 1 tablet daily except 1/2 tablet on Tuesdays. Recheck in 3 weeks.  [facility] [phone]

## 2017-10-16 ENCOUNTER — Ambulatory Visit (INDEPENDENT_AMBULATORY_CARE_PROVIDER_SITE_OTHER): Payer: Medicare Other | Admitting: *Deleted

## 2017-10-16 DIAGNOSIS — I2699 Other pulmonary embolism without acute cor pulmonale: Secondary | ICD-10-CM | POA: Diagnosis not present

## 2017-10-16 DIAGNOSIS — Z5181 Encounter for therapeutic drug level monitoring: Secondary | ICD-10-CM | POA: Diagnosis not present

## 2017-10-16 LAB — POCT INR: INR: 1.2

## 2017-10-16 NOTE — Patient Instructions (Signed)
Description   Today and tomorrow take 1.5 tablets then continue same dosage 1 tablet daily except 1/2 tablet on Tuesdays. Recheck in 1 week.  [facility] [phone]

## 2017-10-29 ENCOUNTER — Ambulatory Visit (INDEPENDENT_AMBULATORY_CARE_PROVIDER_SITE_OTHER): Payer: Medicare Other | Admitting: *Deleted

## 2017-10-29 DIAGNOSIS — Z5181 Encounter for therapeutic drug level monitoring: Secondary | ICD-10-CM | POA: Diagnosis not present

## 2017-10-29 LAB — POCT INR: INR: 1.9

## 2017-10-29 NOTE — Patient Instructions (Signed)
Description   Start taking 1 tablet daily.  Recheck in 2 weeks.  [facility] [phone]

## 2017-11-12 ENCOUNTER — Ambulatory Visit (INDEPENDENT_AMBULATORY_CARE_PROVIDER_SITE_OTHER): Payer: Medicare Other | Admitting: *Deleted

## 2017-11-12 DIAGNOSIS — Z5181 Encounter for therapeutic drug level monitoring: Secondary | ICD-10-CM

## 2017-11-12 LAB — POCT INR: INR: 1.8

## 2017-11-12 NOTE — Patient Instructions (Signed)
Description   Today Feb 12th take 1 and 1/2 tablets (7.5mg ) then continue  taking 1 tablet (5mg )  daily.  Recheck in 2 weeks.  [facility] [phone]

## 2017-11-26 ENCOUNTER — Ambulatory Visit (INDEPENDENT_AMBULATORY_CARE_PROVIDER_SITE_OTHER): Payer: Medicare Other

## 2017-11-26 DIAGNOSIS — Z5181 Encounter for therapeutic drug level monitoring: Secondary | ICD-10-CM

## 2017-11-26 LAB — POCT INR: INR: 1.2

## 2017-11-26 NOTE — Patient Instructions (Signed)
Description   Take 1.5 tablets today and tomorrow, then start taking 1 tablet daily except 1.5 tablets on Mondays.   Recheck in 1 week.  Coumadin Clinic [phone]

## 2017-11-28 ENCOUNTER — Other Ambulatory Visit: Payer: Self-pay | Admitting: Cardiology

## 2017-12-20 ENCOUNTER — Ambulatory Visit: Payer: Medicare Other | Admitting: *Deleted

## 2017-12-20 DIAGNOSIS — Z5181 Encounter for therapeutic drug level monitoring: Secondary | ICD-10-CM | POA: Diagnosis not present

## 2017-12-20 LAB — POCT INR: INR: 1.9

## 2017-12-20 NOTE — Patient Instructions (Signed)
Description   Take 1.5 tablets today and tomorrow take 1.5 tablets, then  start taking 1 tablet daily except 1.5 tablets on Mondays.   Recheck in 2 weeks.  [facility] [phone]

## 2018-01-03 ENCOUNTER — Ambulatory Visit (INDEPENDENT_AMBULATORY_CARE_PROVIDER_SITE_OTHER): Payer: Medicare Other | Admitting: *Deleted

## 2018-01-03 DIAGNOSIS — Z5181 Encounter for therapeutic drug level monitoring: Secondary | ICD-10-CM | POA: Diagnosis not present

## 2018-01-03 DIAGNOSIS — I2699 Other pulmonary embolism without acute cor pulmonale: Secondary | ICD-10-CM | POA: Diagnosis not present

## 2018-01-03 LAB — POCT INR: INR: 3.3

## 2018-01-03 NOTE — Patient Instructions (Addendum)
Description   Do not take any Coumadin today then continue taking 1 tablet daily except 1.5 tablets on Tuesdays. Recheck in 1 week with MD appt.  [facility] [phone]

## 2018-01-04 LAB — GLUCOSE, POCT (MANUAL RESULT ENTRY): POC GLUCOSE: 83 mg/dL (ref 70–99)

## 2018-01-07 ENCOUNTER — Encounter: Payer: Self-pay | Admitting: Physician Assistant

## 2018-01-09 ENCOUNTER — Ambulatory Visit: Payer: Medicare Other | Admitting: Physician Assistant

## 2018-01-23 ENCOUNTER — Ambulatory Visit (INDEPENDENT_AMBULATORY_CARE_PROVIDER_SITE_OTHER): Payer: Medicare Other | Admitting: *Deleted

## 2018-01-23 DIAGNOSIS — Z5181 Encounter for therapeutic drug level monitoring: Secondary | ICD-10-CM

## 2018-01-23 LAB — POCT INR: INR: 3.2

## 2018-01-23 IMAGING — CT CT HEAD W/O CM
3 series · 16 of 47 positions shown, 19 images · non-contrast
Comparison: 11/05/2009.

CLINICAL DATA: Near syncope.

EXAM:
CT HEAD WITHOUT CONTRAST
TECHNIQUE: Contiguous axial images were obtained from the base of the skull
through the vertex without intravenous contrast.

[Series 2: head wo · axial · 0.47mm/px · z∈[-226,-76]mm · 10 of 36 slices shown, 13 images]
[im 3/36  brain]
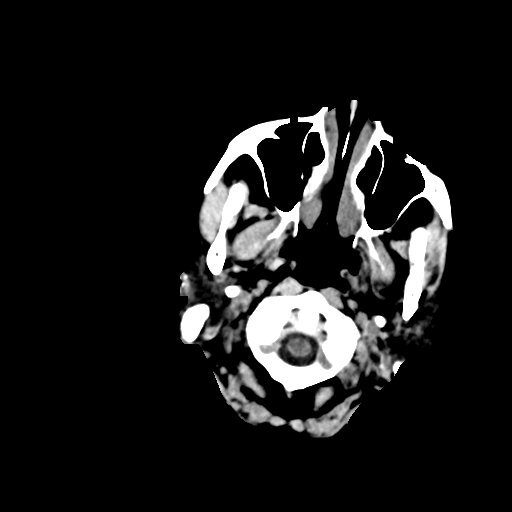
[im 3/36  bone]
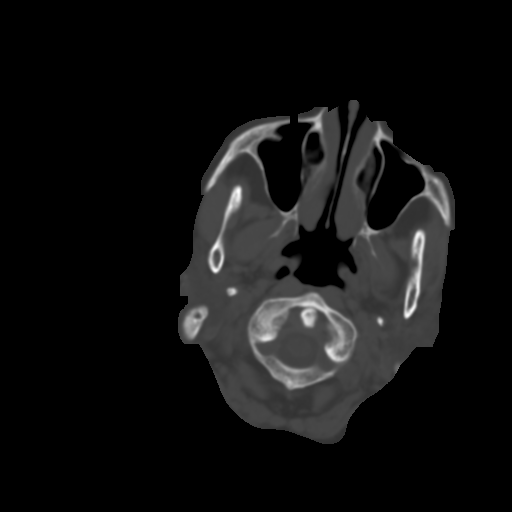
[im 7/36  brain]
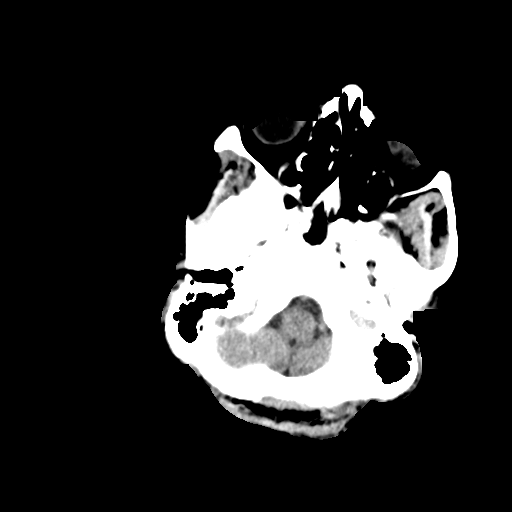
[im 10/36  brain]
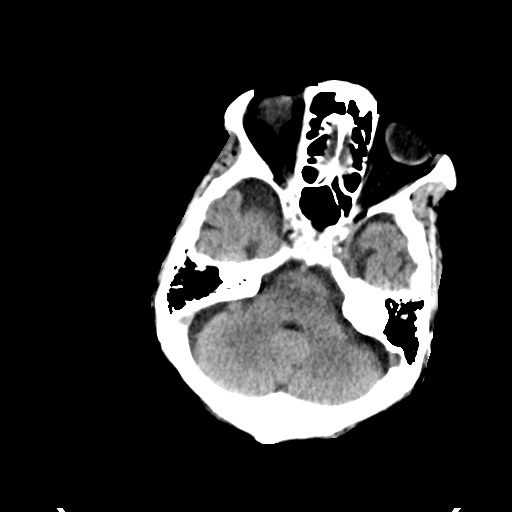
[im 13/36  brain]
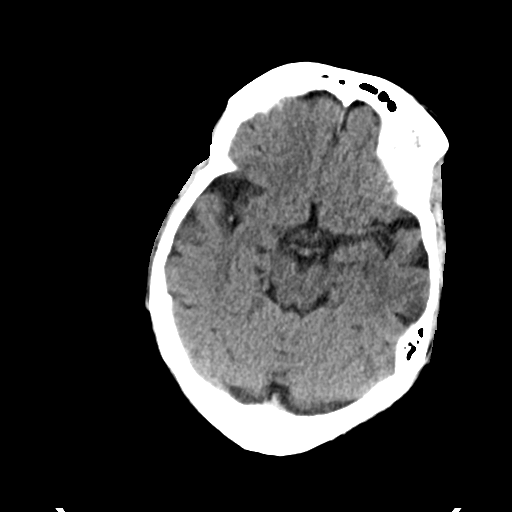
[im 16/36  brain]
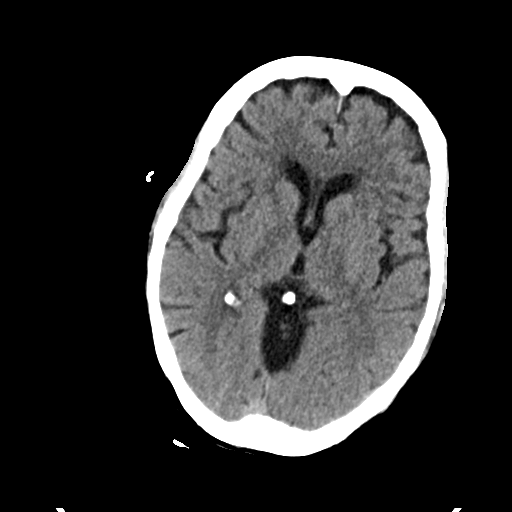
[im 16/36  bone]
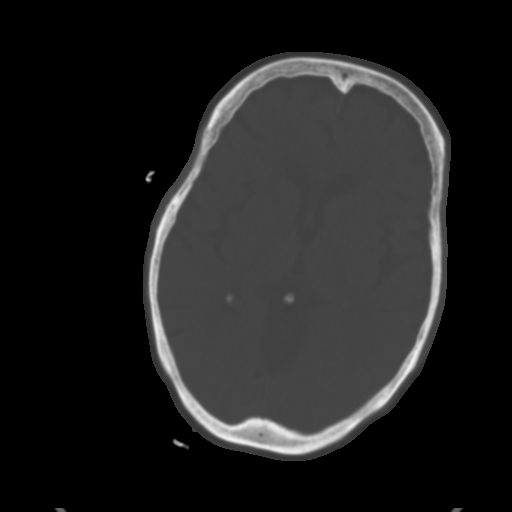
[im 20/36  brain]
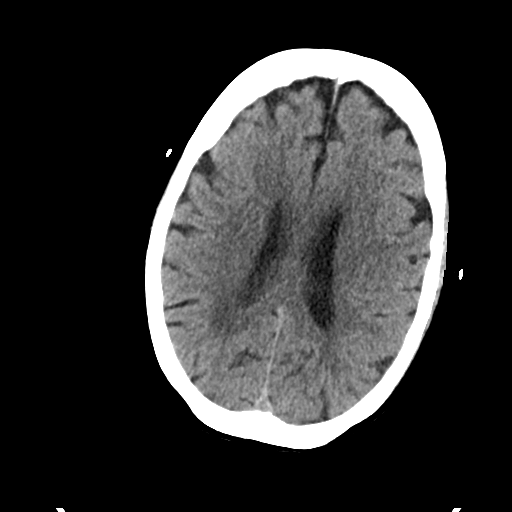
[im 23/36  brain]
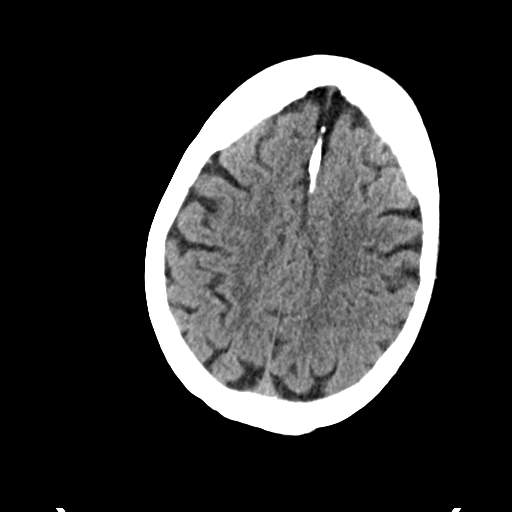
[im 27/36  brain]
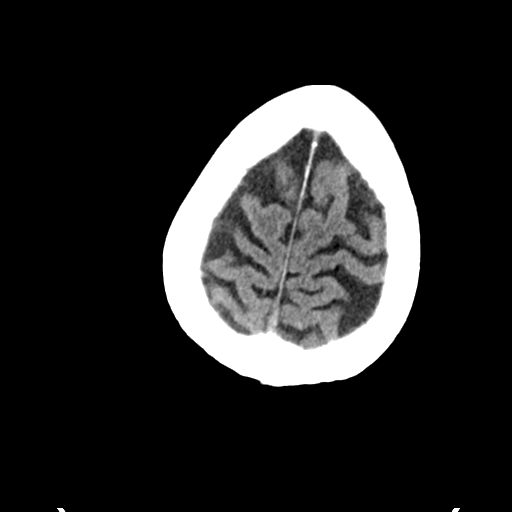
[im 29/36  brain]
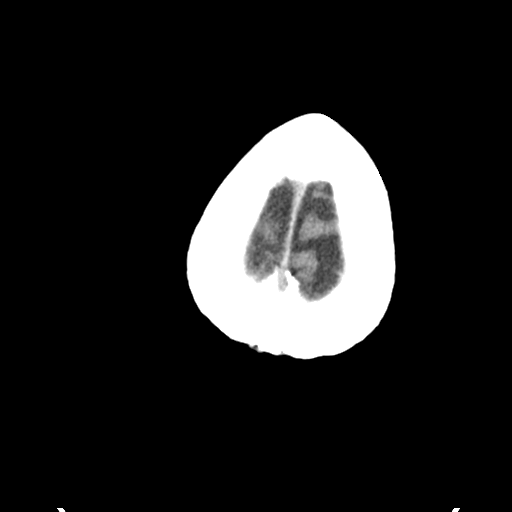
[im 29/36  bone]
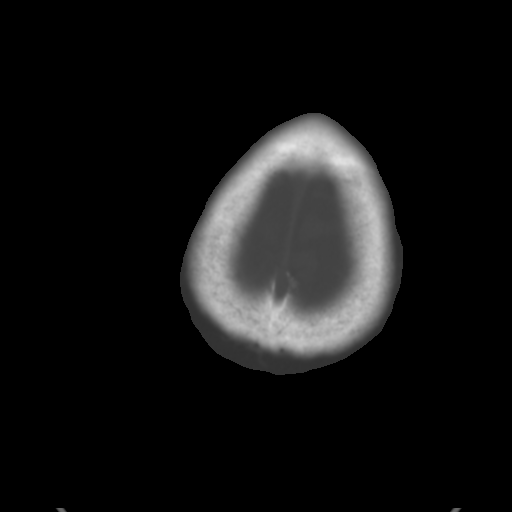
[im 33/36  brain]
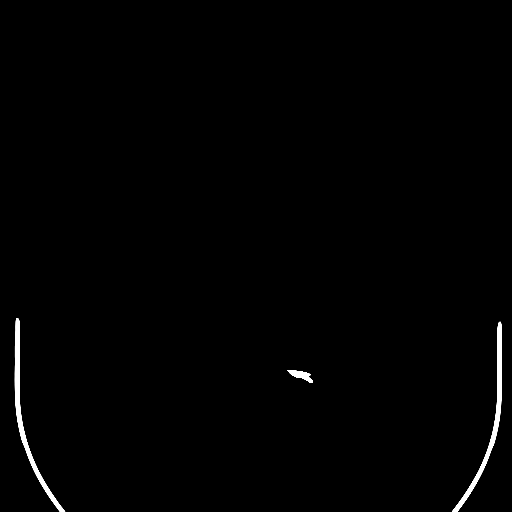

[Series 4: coronal soft tissue · coronal · 0.37mm/px · 3 of 72 slices shown]
[im 24/72  brain]
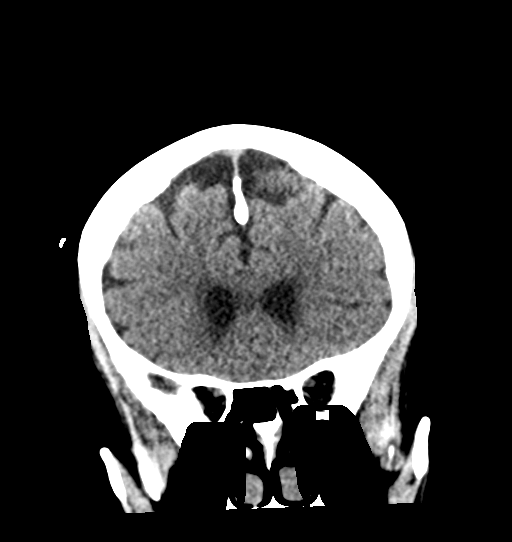
[im 32/72  brain]
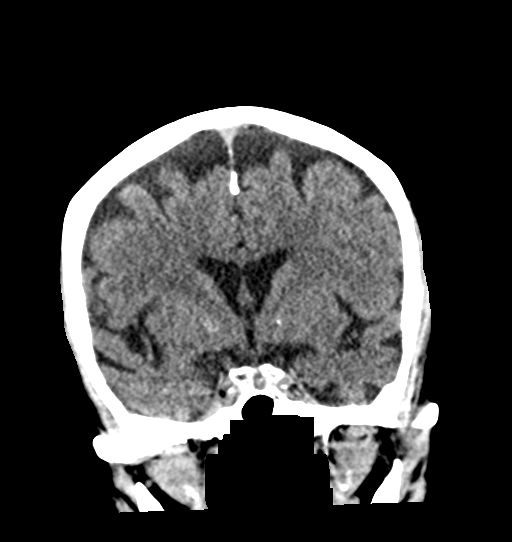
[im 40/72  brain]
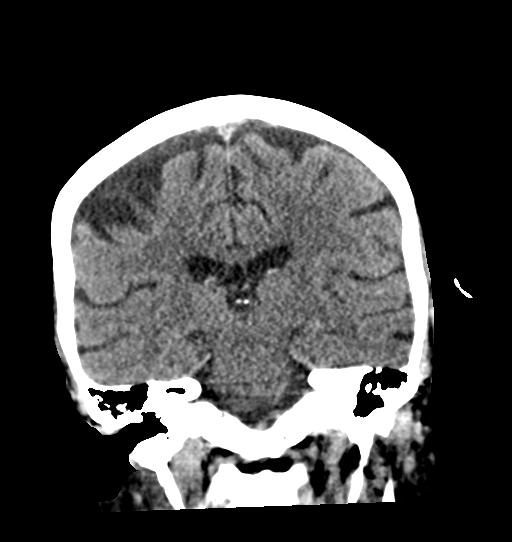

[Series 5: sagittal soft tissue · sagittal · 0.40mm/px · 3 of 67 slices shown]
[im 23/67  brain]
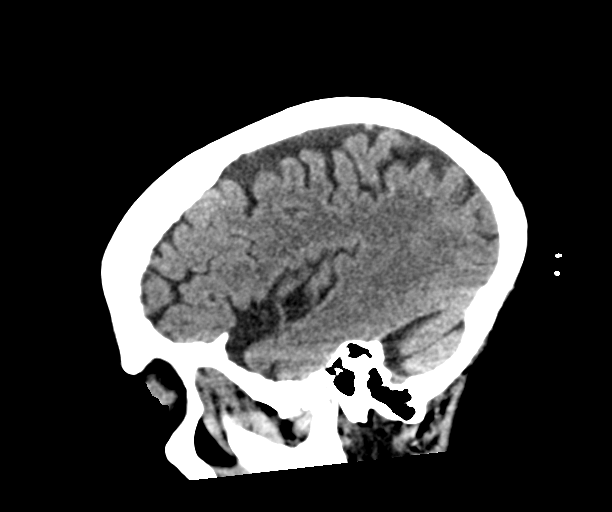
[im 34/67  brain]
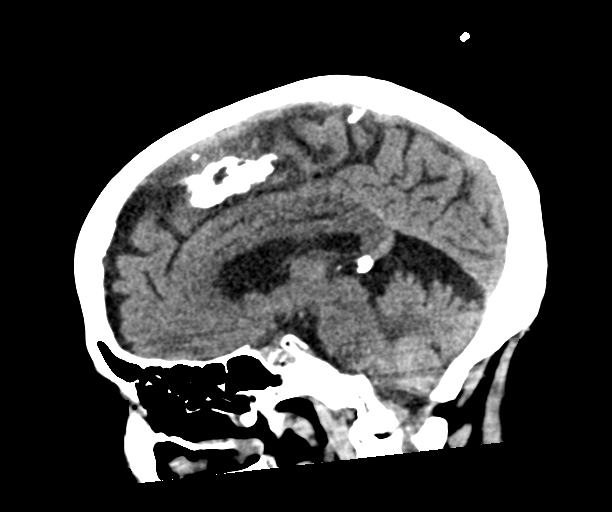
[im 45/67  brain]
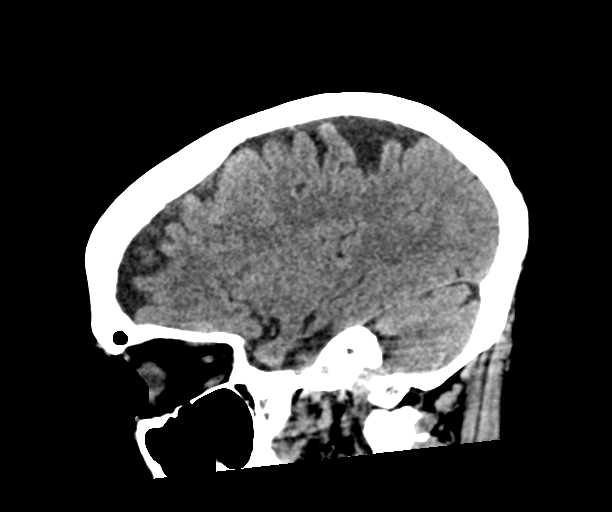

[16 of 47 positions shown; findings below may reference images not displayed]

FINDINGS: Brain: No evidence of acute infarction, hemorrhage, hydrocephalus,
extra-axial collection or mass lesion/mass effect. Atrophy with
small vessel disease, not unexpected for age.

Vascular: No features to suggest emergent large vessel occlusion.
Remarkably little calcification in the carotid siphons.

Skull: Normal. Negative for fracture or focal lesion.

Sinuses/Orbits: No acute finding.

Other: None.
IMPRESSION: Atrophy and small vessel disease similar to priors. No acute
intracranial findings.

## 2018-01-23 NOTE — Patient Instructions (Signed)
Description   Do not take any Coumadin today then continue taking 1 tablet daily except 1.5 tablets on Tuesdays. Recheck in 2 weeks with MD appt.  [facility] [phone]

## 2018-02-05 ENCOUNTER — Encounter: Payer: Self-pay | Admitting: Physician Assistant

## 2018-02-05 NOTE — Progress Notes (Addendum)
Cardiology Office Note    Date:  02/06/2018  ID:  Erin Roberson, DOB 20-May-1924, MRN [id number] PCP:  Corine Shelter, MD  Cardiologist: Dr. Shirlee Latch remotely.    Chief Complaint: overdue f/u; dizziness.  History of Present Illness:  Erin Roberson is a 84 y.o. 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  Last cath in 10/11 with nonobstructive disease, EF 60% by LV-gram.She has history of frequent prior visits for atypical chest pain, with low risk stress test 12/2015. Dr. Shirlee Latch reviewed and felt it was low risk and he would not pursue cath. She is followed in our Coumadin clinic for PT/INRs. Last echo 11/2016 showed EF 60-65%, grade 1 DD, mild AI. Last labs aside from INR 11/2016 showed K 4.1, Cr 0.94, albumin 3.1, Hgb 11.5 (previously 12-13).  She returns for overdue f/u with her daughter. In general she feels she's done well without CP or SOB. No LEE, orthopnea, PND or recurrent VTE event that she is aware of. She had one mechanical fall last year but none this year. She does report a 1 year gradual hx of fairly constant dizziness. The patient thinks the dizziness is better upon standing and moving her head. However, I stood her up in the visit to take orthostatics and this reproduced mild dizziness with a corresponding drop in her BP to 98/60 (from 124/80). She did not pass out. This resolved with sitting again. Denies bleeding. Per her report she probably only drinks a cup of coffee and a minimal amount of water per day. Her appetite is not very robust either but she hasn't had any significant weight changes. She also carries a hx of dementia.   Past Medical History:  Diagnosis Date  . Arthritis   . Breast mass    left breast  . Clotting disorder (HCC)   . Coronary atherosclerosis    a. s/p PCTA LAD (1992). b. Last cath in 10/11 with  nonobstructive disease, EF 60% by LV-gram.  . Dementia   . Depression   . Diverticulosis of colon (without mention of hemorrhage)   . Fibromyalgia   . Glaucoma   . Hemorrhage of rectum and anus   . History of uterine cancer   . Iron deficiency anemia, unspecified   . Leukopenia    chronic--benign  . Other pulmonary embolism and infarction    2011  . Pure hypercholesterolemia   . Thyroid disease   . UTI (lower urinary tract infection)     Past Surgical History:  Procedure Laterality Date  . ABDOMINAL HYSTERECTOMY    . CARDIAC CATHETERIZATION  07/25/10   luminal irregularities in multiple vessels, no sig stenosis, EF 60%  . CATARACT EXTRACTION  2004   right  . COLONOSCOPY  07/01/2007   diverticulosis  . CORONARY ANGIOPLASTY  1992   LAD  . KIDNEY SURGERY     stent placed  . NECK SURGERY    . thyroid needle aspiration  12/2007   right, hyperplastic nodule  . UPPER GASTROINTESTINAL ENDOSCOPY  07/16/2007   tortous esophagus with spasms    Current Medications: Current Meds  Medication Sig  . atorvastatin (LIPITOR) 80 MG tablet Take 80 mg by mouth daily at 6 PM. 1/2 tablet (total ) daily ON TUESDAYS [name]  . NAMZARIC 7-10 MG CP24 Take 1 capsule by mouth daily.  Marland Kitchen warfarin (COUMADIN) 5 MG tablet  TAKE AS DIRECTED BY COUMADIN CLINIC  . warfarin (COUMADIN) 5 MG tablet TAKE AS DIRECTED BY COUMADIN CLINIC    Allergies:   Patient has no known allergies.   Social History   Socioeconomic History  . Marital status: Divorced    Spouse name: Not on file  . Number of children: Not on file  . Years of education: Not on file  . Highest education level: Not on file  Occupational History    Employer: RETIRED  Social Needs  . Financial resource strain: Not on file  . Food insecurity:    Worry: Not on file    Inability: Not on file  . Transportation needs:    Medical: Not on file    Non-medical: Not on file  Tobacco Use  . Smoking status: Never Smoker  . Smokeless  tobacco: Never Used  Substance and Sexual Activity  . Alcohol use: No  . Drug use: No  . Sexual activity: Not on file  Lifestyle  . Physical activity:    Days per week: Not on file    Minutes per session: Not on file  . Stress: Not on file  Relationships  . Social connections:    Talks on phone: Not on file    Gets together: Not on file    Attends religious service: Not on file    Active member of club or organization: Not on file    Attends meetings of clubs or organizations: Not on file    Relationship status: Not on file  Other Topics Concern  . Not on file  Social History Narrative  . Not on file     Family History:  Family History  Problem Relation Age of Onset  . Diabetes Mother        deceased age 16  . Hypertension Mother   . Pancreatic cancer Father        deceased age 69  . Emphysema Brother        deceased  . Alcohol abuse Brother   . Osteoporosis Unknown   . Stroke Maternal Aunt   . Colon cancer Neg Hx   . Heart attack Neg Hx     ROS:   Please see the history of present illness. .  All other systems are reviewed and otherwise negative.    PHYSICAL EXAM:   VS:  BP 124/80 (BP Location: Right Arm, Patient Position: Sitting, Cuff Size: Normal)   Pulse 77   Ht  (1.626 m)   Wt 135 lb (61.2 kg)   SpO2 97%   BMI 23.17 kg/m   BMI: Body mass index is 23.17 kg/m. GEN: Well nourished, well developed friendly AAF, in no acute distress  HEENT: normocephalic, atraumatic Neck: no JVD, carotid bruits, or masses Cardiac: RRR; no murmurs, rubs, or gallops, no edema  Respiratory:  clear to auscultation bilaterally, normal work of breathing GI: soft, nontender, nondistended, + BS MS: no deformity or atrophy  Skin: warm and dry, no rash Neuro:  Alert and Oriented x 3, Strength and sensation are intact, follows commands Psych: euthymic mood, full affect  Wt Readings from Last 3 Encounters:  02/06/18 135 lb (61.2 kg)  11/11/16 132 lb 7.9 oz (60.1 kg)    01/18/16 136 lb (61.7 kg)      Studies/Labs Reviewed:   EKG:  EKG was ordered today and personally reviewed by me and demonstrates NSR 75bpm, TWI inferiorly and V3-V6. No sig change from prior.   Recent Labs: No results found  for requested labs within last 8760 hours.   Lipid Panel    Component Value Date/Time   CHOL 170 06/01/2015 1636   TRIG 108.0 06/01/2015 1636   HDL 52.40 06/01/2015 1636   CHOLHDL 3 06/01/2015 1636   VLDL 21.6 06/01/2015 1636   LDLCALC 96 06/01/2015 1636   LDLDIRECT 138.4 02/13/2013 1307    Additional studies/ records that were reviewed today include: Summarized above    ASSESSMENT & PLAN:   1. Orthostatic hypotension/dizziness - check CBC, BMET, TSH today. Suspect this is sequelae of dementia due to poor fluid intake. We discussed medication therapy but both she and her daughter see definite room for improvement on her fluid intake so they would like to try that first. Discussed liberalization of sodium intake (no hx clinical CHF) and drinking 64 oz of non-caffeinated fluid per day. We also discussed trial of compression hose/abdominal binder. She will notify our office if symptoms do not improve. She also sees PCP within a few weeks - I asked her to discuss with them as there are also some atypical features of her dizziness that may be related to vertigo. 2. CAD - no recent anginal sx. Not on ASA due to concomitant warfarin. 3. H/o PE - she denies any complaints with Coumadin, so will continue monitoring by pharmacy.  I'll reach out to Dr. Shirlee Latch to see if he has any specific input about considering NOACs as an alternative, or continuing current regimen. Check updated labs as above. We did discuss usage of an assistive device such as walker to help steady herself in times of dizziness. I gave her a prescription for a rolling walker. 4. Hyperlipidemia - followed by PCP. 5. Mild AI - she has no significant murmur on exam. Will follow clinically for  now.  Disposition: F/u with Dr. Okey Dupre in 3-4 months to establish care and f/u dizziness.   Medication Adjustments/Labs and Tests Ordered: Current medicines are reviewed at length with the patient today.  Concerns regarding medicines are outlined above. Medication changes, Labs and Tests ordered today are summarized above and listed in the Patient Instructions accessible in Encounters.   Signed, Laurann Montana, PA-C  02/06/2018 2:55 PM    [facility] [address] [place], [place], [place]  [address] Phone: [phone]; Fax: [phone]

## 2018-02-06 ENCOUNTER — Ambulatory Visit (INDEPENDENT_AMBULATORY_CARE_PROVIDER_SITE_OTHER): Payer: Medicare Other | Admitting: *Deleted

## 2018-02-06 ENCOUNTER — Encounter: Payer: Self-pay | Admitting: Physician Assistant

## 2018-02-06 ENCOUNTER — Ambulatory Visit: Payer: Medicare Other | Admitting: Physician Assistant

## 2018-02-06 VITALS — BP 124/80 | HR 77 | Ht 64.0 in | Wt 135.0 lb

## 2018-02-06 DIAGNOSIS — I951 Orthostatic hypotension: Secondary | ICD-10-CM | POA: Diagnosis not present

## 2018-02-06 DIAGNOSIS — I2510 Atherosclerotic heart disease of native coronary artery without angina pectoris: Secondary | ICD-10-CM | POA: Diagnosis not present

## 2018-02-06 DIAGNOSIS — E785 Hyperlipidemia, unspecified: Secondary | ICD-10-CM | POA: Diagnosis not present

## 2018-02-06 DIAGNOSIS — Z5181 Encounter for therapeutic drug level monitoring: Secondary | ICD-10-CM | POA: Diagnosis not present

## 2018-02-06 DIAGNOSIS — Z86711 Personal history of pulmonary embolism: Secondary | ICD-10-CM | POA: Diagnosis not present

## 2018-02-06 DIAGNOSIS — I351 Nonrheumatic aortic (valve) insufficiency: Secondary | ICD-10-CM | POA: Diagnosis not present

## 2018-02-06 LAB — POCT INR: INR: 1.8

## 2018-02-06 NOTE — Patient Instructions (Signed)
Description   Today May 9th take 1 and 1/2 tablets then continue taking 1 tablet daily except 1.5 tablets on Tuesdays. Recheck in 2 weeks with MD appt.  [facility] [phone]

## 2018-02-06 NOTE — Patient Instructions (Signed)
Medication Instructions:  Your physician recommends that you continue on your current medications as directed. Please refer to the Current Medication list given to you today.   Labwork: BMET, CBC, TSH  Testing/Procedures: None ordered  Follow-Up: Your physician wants you to follow-up in: 3-4 months with Dr. Okey Dupre to establish new patient care. You will receive a reminder letter in the mail two months in advance. If you don't receive a letter, please call our office to schedule the follow-up appointment.   Any Other Special Instructions Will Be Listed Below (If Applicable).  1. Increase sodium and fluid intake (at least 64 oz of non-caffienated drink per day)  2. Try compression hose or abdominal binder.  3. Condsider using a rolling walker since you have been dizzy.   If you need a refill on your cardiac medications before your next appointment, please call your pharmacy.

## 2018-02-07 LAB — BASIC METABOLIC PANEL
BUN/Creatinine Ratio: 19 (ref 12–28)
BUN: 21 mg/dL (ref 10–36)
CALCIUM: 9.3 mg/dL (ref 8.7–10.3)
CO2: 23 mmol/L (ref 20–29)
Chloride: 107 mmol/L — ABNORMAL HIGH (ref 96–106)
Creatinine, Ser: 1.08 mg/dL — ABNORMAL HIGH (ref 0.57–1.00)
GFR calc Af Amer: 51 mL/min/{1.73_m2} — ABNORMAL LOW (ref >59)
GFR calc non Af Amer: 45 mL/min/{1.73_m2} — ABNORMAL LOW (ref >59)
GLUCOSE: 107 mg/dL — AB (ref 65–99)
POTASSIUM: 4.9 mmol/L (ref 3.5–5.2)
SODIUM: 146 mmol/L — AB (ref 134–144)

## 2018-02-07 LAB — CBC
HEMOGLOBIN: 13.2 g/dL (ref 11.1–15.9)
Hematocrit: 40.3 % (ref 34.0–46.6)
MCH: 31.2 pg (ref 26.6–33.0)
MCHC: 32.8 g/dL (ref 31.5–35.7)
MCV: 95 fL (ref 79–97)
Platelets: 237 10*3/uL (ref 150–379)
RBC: 4.23 x10E6/uL (ref 3.77–5.28)
RDW: 14.2 % (ref 12.3–15.4)
WBC: 4.2 10*3/uL (ref 3.4–10.8)

## 2018-02-07 LAB — TSH: TSH: 1.43 u[IU]/mL (ref 0.450–4.500)

## 2018-02-07 NOTE — Addendum Note (Signed)
Addended by: Daleen Bo I on: 02/07/2018 12:12 PM   Modules accepted: Orders

## 2018-02-10 NOTE — Addendum Note (Signed)
Addended by: Carren Rang on: 02/10/2018 08:01 AM   Modules accepted: Orders

## 2018-02-20 ENCOUNTER — Ambulatory Visit (INDEPENDENT_AMBULATORY_CARE_PROVIDER_SITE_OTHER): Payer: Medicare Other | Admitting: *Deleted

## 2018-02-20 DIAGNOSIS — Z5181 Encounter for therapeutic drug level monitoring: Secondary | ICD-10-CM | POA: Diagnosis not present

## 2018-02-20 LAB — POCT INR: INR: 5.1 — AB (ref 2.0–3.0)

## 2018-02-20 NOTE — Patient Instructions (Signed)
Description   Do not take take coumadin today May 23rd and no coumadin May 24th then change dose back to Coumadin 1 tablet ( ) daily  Monitor for any bleeding and go to ER if seen Eat good serving of greens today and tomorrow then keep intake of greens consistent. Recheck in 1 week  Call Coumadin clinic with any questions new medications or if scheduled for any procedures [phone]

## 2018-02-27 ENCOUNTER — Ambulatory Visit (INDEPENDENT_AMBULATORY_CARE_PROVIDER_SITE_OTHER): Payer: Medicare Other | Admitting: *Deleted

## 2018-02-27 DIAGNOSIS — Z5181 Encounter for therapeutic drug level monitoring: Secondary | ICD-10-CM | POA: Diagnosis not present

## 2018-02-27 LAB — POCT INR: INR: 2 (ref 2.0–3.0)

## 2018-02-27 NOTE — Patient Instructions (Signed)
Description   Continue same dose of coumadin  1 tablet ( ) daily  Keep intake of greens consistent. Recheck in 2 weeks  Call Coumadin clinic with any questions new medications or if scheduled for any procedures [phone]

## 2018-03-13 ENCOUNTER — Ambulatory Visit (INDEPENDENT_AMBULATORY_CARE_PROVIDER_SITE_OTHER): Payer: Medicare Other | Admitting: *Deleted

## 2018-03-13 DIAGNOSIS — Z5181 Encounter for therapeutic drug level monitoring: Secondary | ICD-10-CM | POA: Diagnosis not present

## 2018-03-13 LAB — POCT INR: INR: 1.5 — AB (ref 2.0–3.0)

## 2018-03-13 NOTE — Patient Instructions (Signed)
Description   Since you took extra yesterday, continue taking Coumadin 1 tablet (5mg ) daily.  Recheck in 1 week.  Call Coumadin clinic with any questions new medications or if scheduled for any procedures [phone]

## 2018-03-20 ENCOUNTER — Ambulatory Visit: Payer: Medicare Other | Admitting: Pharmacist

## 2018-03-20 DIAGNOSIS — Z5181 Encounter for therapeutic drug level monitoring: Secondary | ICD-10-CM | POA: Diagnosis not present

## 2018-03-20 LAB — POCT INR: INR: 1.7 — AB (ref 2.0–3.0)

## 2018-03-20 NOTE — Patient Instructions (Signed)
Description   Take 1.5 tablets today, then continue taking Coumadin 1 tablet (5mg ) daily.  Recheck in 10 days. Call Coumadin clinic with any questions new medications or if scheduled for any procedures [phone]

## 2018-04-01 ENCOUNTER — Ambulatory Visit (INDEPENDENT_AMBULATORY_CARE_PROVIDER_SITE_OTHER): Payer: Medicare Other | Admitting: *Deleted

## 2018-04-01 DIAGNOSIS — Z5181 Encounter for therapeutic drug level monitoring: Secondary | ICD-10-CM

## 2018-04-01 LAB — POCT INR: INR: 2.2 (ref 2.0–3.0)

## 2018-04-01 NOTE — Patient Instructions (Signed)
Description   Continue taking Coumadin 1 tablet (5mg ) daily.  Recheck in 2 weeks  Call Coumadin clinic with any questions new medications or if scheduled for any procedures [phone]

## 2018-04-17 ENCOUNTER — Ambulatory Visit: Payer: Medicare Other | Admitting: *Deleted

## 2018-04-17 DIAGNOSIS — Z5181 Encounter for therapeutic drug level monitoring: Secondary | ICD-10-CM | POA: Diagnosis not present

## 2018-04-17 LAB — POCT INR: INR: 2.9 (ref 2.0–3.0)

## 2018-04-17 NOTE — Patient Instructions (Signed)
Description   Continue taking Coumadin 1 tablet (5mg ) daily.  Recheck in 3 weeks  Call Coumadin clinic with any questions new medications or if scheduled for any procedures [phone] Spoke with pt's daughter Albertine PatriciaCynthia Jobe phone [phone] and gave her INR results and dose instructions

## 2018-05-08 ENCOUNTER — Ambulatory Visit: Payer: Medicare Other | Admitting: *Deleted

## 2018-05-08 DIAGNOSIS — Z5181 Encounter for therapeutic drug level monitoring: Secondary | ICD-10-CM

## 2018-05-08 LAB — POCT INR: INR: 2.2 (ref 2.0–3.0)

## 2018-05-08 NOTE — Patient Instructions (Signed)
Description   Continue taking Coumadin 1 tablet (5mg ) daily.  Recheck in 4 weeks  Call Coumadin clinic with any questions new medications or if scheduled for any procedures [phone] Spoke with pt's daughter Albertine PatriciaCynthia Treu phone [phone] and gave her INR results and dose instructions

## 2018-05-15 ENCOUNTER — Ambulatory Visit: Payer: Medicare Other | Admitting: Internal Medicine

## 2018-05-15 ENCOUNTER — Encounter: Payer: Self-pay | Admitting: Internal Medicine

## 2018-05-15 VITALS — BP 98/50 | HR 77 | Ht 64.0 in | Wt 131.6 lb

## 2018-05-15 DIAGNOSIS — E785 Hyperlipidemia, unspecified: Secondary | ICD-10-CM | POA: Diagnosis not present

## 2018-05-15 NOTE — Patient Instructions (Addendum)
Medication Instructions:  Your physician recommends that you continue on your current medications as directed. Please refer to the Current Medication list given to you today.  -- If you need a refill on your cardiac medications before your next appointment, please call your pharmacy. --  Labwork: CBC CMP LIPID PROFILE DIRECT LDL  Testing/Procedures: None ordered  Follow-Up: Your physician wants you to follow-up in: 3 MONTHS with Dayna Dunn     Thank you for choosing [facility]!!    Any Other Special Instructions Will Be Listed Below (If Applicable).  DRINK a lot of WATER 64 ounces a day

## 2018-05-15 NOTE — Progress Notes (Signed)
Follow-up Outpatient Visit Date: 05/15/2018  Primary Care Provider: Corine ShelterKilpatrick, George, MD [address] [place] [id number]  Chief Complaint: Dizziness  HPI:  [name] is a 84 y.o. year-old female with history of CAD s/p PCTA LAD (1992), mild aortic regurgitation, unprovoked PE on coumadin, hyperlipidemia, h/o uterine cancer, dementia, fibromyalgia, and atypical chest pain, who presents for follow-up of of coronary artery disease.  She was previously followed in our office by Dr. Shirlee LatchMcLean and was most recently seen by Ronie Spiesayna Dunn, PA, in May.  At that time she was doing well other than frequent dizziness over the preceding year.  She was noted to have orthostatic hypotension.  Increased fluid intake as well as compression stockings +/- abdominal binder were recommended.  Today, [name] reports that she is frequently lightheaded throughout the day.  She otherwise feels well, denying chest pain, shortness of breath, palpitations, and edema.  Despite previously having been encouraged to drink extra fluids, she only drinks mornings with breakfast and at dinnertime.  She has not been wearing an abdominal binder or compression stockings on a regular basis.  --------------------------------------------------------------------------------------------------  Past Medical History:  Diagnosis Date  . Arthritis   . Breast mass    left breast  . Clotting disorder (HCC)   . Coronary atherosclerosis    a. s/p PCTA LAD (1992). b. Last cath in 10/11 with nonobstructive disease, EF 60% by LV-gram.  . Dementia   . Depression   . Diverticulosis of colon (without mention of hemorrhage)   . Fibromyalgia   . Glaucoma   . Hemorrhage of rectum and anus   . History of uterine cancer   . Iron deficiency anemia, unspecified   . Leukopenia    chronic--benign  . Other pulmonary embolism and infarction    2011  . Pure hypercholesterolemia   . Thyroid disease   . UTI (lower urinary tract  infection)    Past Surgical History:  Procedure Laterality Date  . ABDOMINAL HYSTERECTOMY    . CARDIAC CATHETERIZATION  07/25/10   luminal irregularities in multiple vessels, no sig stenosis, EF 60%  . CATARACT EXTRACTION  2004   right  . COLONOSCOPY  07/01/2007   diverticulosis  . CORONARY ANGIOPLASTY  1992   LAD  . KIDNEY SURGERY     stent placed  . NECK SURGERY    . thyroid needle aspiration  12/2007   right, hyperplastic nodule  . UPPER GASTROINTESTINAL ENDOSCOPY  07/16/2007   tortous esophagus with spasms    Current Meds  Medication Sig  . atorvastatin (LIPITOR) 80 MG tablet Take 80 mg by mouth daily at 6 PM.   . NAMZARIC 7-10 MG CP24 Take 1 capsule by mouth daily.  Marland Kitchen. warfarin (COUMADIN) 5 MG tablet TAKE AS DIRECTED BY COUMADIN CLINIC    Allergies: Patient has no known allergies.  Social History   Tobacco Use  . Smoking status: Never Smoker  . Smokeless tobacco: Never Used  Substance Use Topics  . Alcohol use: No  . Drug use: No    Family History  Problem Relation Age of Onset  . Diabetes Mother        deceased age 32105  . Hypertension Mother   . Pancreatic cancer Father        deceased age 82  . Emphysema Brother        deceased  . Alcohol abuse Brother   . Osteoporosis Unknown   . Stroke Maternal Aunt   . Colon cancer Neg Hx   .  Heart attack Neg Hx     Review of Systems: A 12-system review of systems was performed and was negative except as noted in the HPI.  --------------------------------------------------------------------------------------------------  Physical Exam: BP (!) 84/48   Pulse 77   Ht 5\' 4"  (1.626 m)   Wt 131 lb 9.6 oz (59.7 kg)   SpO2 98%   BMI 22.59 kg/m   Recheck BP: 98/50  General: Elderly woman, seated comfortably in the exam room. HEENT: No conjunctival pallor or scleral icterus. Moist mucous membranes.  OP clear. Neck: Supple without lymphadenopathy, thyromegaly, JVD, or HJR. No carotid bruit. Lungs: Normal work of  breathing. Clear to auscultation bilaterally without wheezes or crackles. Heart: Regular rate and rhythm without murmurs, rubs, or gallops. Non-displaced PMI. Abd: Bowel sounds present. Soft, NT/ND without hepatosplenomegaly Ext: No lower extremity edema.  Skin: Warm and dry without rash.   Lab Results  Component Value Date   WBC 4.2 02/06/2018   HGB 13.2 02/06/2018   HCT 40.3 02/06/2018   MCV 95 02/06/2018   PLT 237 02/06/2018    Lab Results  Component Value Date   NA 146 (H) 02/06/2018   K 4.9 02/06/2018   CL 107 (H) 02/06/2018   CO2 23 02/06/2018   BUN 21 02/06/2018   CREATININE 1.08 (H) 02/06/2018   GLUCOSE 107 (H) 02/06/2018   ALT 12 (L) 11/11/2016    Lab Results  Component Value Date   CHOL 170 06/01/2015   HDL 52.40 06/01/2015   LDLCALC 96 06/01/2015   LDLDIRECT 138.4 02/13/2013   TRIG 108.0 06/01/2015   CHOLHDL 3 06/01/2015    --------------------------------------------------------------------------------------------------  ASSESSMENT AND PLAN: Hypotension This is likely multifactorial, though I suspect a large portion of it is poor hydration secondary to limited oral intake.  She does not have any signs or symptoms of active infection.  I stressed the importance of staying well-hydrated and encouraged Ms. Noboa to drink at least 64 ounces of water throughout the day.  I also advised her to liberalize her salt intake.  She should also consider seeing compression stockings and/or an abdominal binder, if she is able to tolerate these.  If she continues to have soft blood pressure, we may need to consider adding fludrocortisone or midodrine to minimize symptomatic hypotension.  I will check a CMP and CBC today to exclude significant electrolyte abnormalities, anemia, or leukocytosis that could be contributing to her low blood pressure.  Hyperlipidemia Patient reports being compliant with atorvastatin, though most recent LDL was still quite high despite being on  high intensity statin therapy.  I will recheck a lipid panel today.  If her LDL still remains significantly elevated, we may need to have her see the lipid clinic to discuss alternative therapies my though her advanced age makes it difficult to know how aggressive to be with her lipid control  Follow-up: Return to clinic in 3 months to see Ronie Spiesayna Dunn, PA.  Yvonne Kendallhristopher Trevonn Hallum, MD 05/15/2018 4:27 PM

## 2018-05-16 ENCOUNTER — Other Ambulatory Visit: Payer: Self-pay | Admitting: Nurse Practitioner

## 2018-05-16 DIAGNOSIS — E785 Hyperlipidemia, unspecified: Secondary | ICD-10-CM

## 2018-05-16 DIAGNOSIS — I2510 Atherosclerotic heart disease of native coronary artery without angina pectoris: Secondary | ICD-10-CM

## 2018-05-16 LAB — COMPREHENSIVE METABOLIC PANEL
A/G RATIO: 1.4 (ref 1.2–2.2)
ALK PHOS: 92 IU/L (ref 39–117)
ALT: 8 IU/L (ref 0–32)
AST: 19 IU/L (ref 0–40)
Albumin: 3.7 g/dL (ref 3.2–4.6)
BUN/Creatinine Ratio: 17 (ref 12–28)
BUN: 17 mg/dL (ref 10–36)
Bilirubin Total: <0.2 mg/dL (ref 0.0–1.2)
CO2: 23 mmol/L (ref 20–29)
CREATININE: 1 mg/dL (ref 0.57–1.00)
Calcium: 8.7 mg/dL (ref 8.7–10.3)
Chloride: 108 mmol/L — ABNORMAL HIGH (ref 96–106)
GFR calc Af Amer: 57 mL/min/{1.73_m2} — ABNORMAL LOW (ref >59)
GFR calc non Af Amer: 49 mL/min/{1.73_m2} — ABNORMAL LOW (ref >59)
GLOBULIN, TOTAL: 2.6 g/dL (ref 1.5–4.5)
Glucose: 72 mg/dL (ref 65–99)
POTASSIUM: 4 mmol/L (ref 3.5–5.2)
SODIUM: 142 mmol/L (ref 134–144)
Total Protein: 6.3 g/dL (ref 6.0–8.5)

## 2018-05-16 LAB — LIPID PANEL
CHOLESTEROL TOTAL: 261 mg/dL — AB (ref 100–199)
Chol/HDL Ratio: 4.2 ratio (ref 0.0–4.4)
HDL: 62 mg/dL (ref >39)
LDL Calculated: 168 mg/dL — ABNORMAL HIGH (ref 0–99)
TRIGLYCERIDES: 156 mg/dL — AB (ref 0–149)
VLDL Cholesterol Cal: 31 mg/dL (ref 5–40)

## 2018-05-16 LAB — CBC
HEMATOCRIT: 38.2 % (ref 34.0–46.6)
HEMOGLOBIN: 13.1 g/dL (ref 11.1–15.9)
MCH: 32.4 pg (ref 26.6–33.0)
MCHC: 34.3 g/dL (ref 31.5–35.7)
MCV: 95 fL (ref 79–97)
Platelets: 215 10*3/uL (ref 150–450)
RBC: 4.04 x10E6/uL (ref 3.77–5.28)
RDW: 13.6 % (ref 12.3–15.4)
WBC: 3 10*3/uL — ABNORMAL LOW (ref 3.4–10.8)

## 2018-05-16 LAB — LDL CHOLESTEROL, DIRECT: LDL Direct: 177 mg/dL — ABNORMAL HIGH (ref 0–99)

## 2018-05-17 ENCOUNTER — Encounter: Payer: Self-pay | Admitting: Internal Medicine

## 2018-06-05 ENCOUNTER — Ambulatory Visit: Payer: Medicare Other | Admitting: *Deleted

## 2018-06-05 DIAGNOSIS — Z5181 Encounter for therapeutic drug level monitoring: Secondary | ICD-10-CM

## 2018-06-05 LAB — POCT INR: INR: 1.2 — AB (ref 2.0–3.0)

## 2018-06-05 NOTE — Patient Instructions (Signed)
Description   Tonight and tomorrow take 1.5 tablets, then Continue taking Coumadin 1 tablet (5mg ) daily.  Recheck in 10 days.  Call Coumadin clinic with any questions new medications or if scheduled for any procedures [phone] Spoke with pt's daughter Karneisha Kaczanowski phone [phone] and gave her INR results and dose instructions

## 2018-06-16 ENCOUNTER — Ambulatory Visit (INDEPENDENT_AMBULATORY_CARE_PROVIDER_SITE_OTHER): Payer: Medicare Other | Admitting: Pharmacist

## 2018-06-16 DIAGNOSIS — Z5181 Encounter for therapeutic drug level monitoring: Secondary | ICD-10-CM | POA: Diagnosis not present

## 2018-06-16 LAB — POCT INR: INR: 3.2 — AB (ref 2.0–3.0)

## 2018-06-16 NOTE — Patient Instructions (Signed)
Take only 1/2 tablet tonight then Continue taking Coumadin 1 tablet (5mg ) daily.  Recheck in 2 week.  Call Coumadin clinic with any questions new medications or if scheduled for any procedures [phone]

## 2018-06-30 ENCOUNTER — Ambulatory Visit: Payer: Medicare Other

## 2018-06-30 DIAGNOSIS — Z5181 Encounter for therapeutic drug level monitoring: Secondary | ICD-10-CM

## 2018-06-30 LAB — POCT INR: INR: 2.9 (ref 2.0–3.0)

## 2018-06-30 NOTE — Patient Instructions (Signed)
Description   Continue taking Coumadin 1 tablet (5mg ) daily.  Recheck in 3 weeks.  Continue eating 2 servings of green leafy vegetables each week. Call Coumadin clinic with any questions new medications or if scheduled for any procedures [phone] Spoke with pt's daughter Erin Roberson phone [phone] and gave her INR results and dose instructions

## 2018-07-21 ENCOUNTER — Ambulatory Visit: Payer: Medicare Other | Admitting: Pharmacist

## 2018-07-21 DIAGNOSIS — Z5181 Encounter for therapeutic drug level monitoring: Secondary | ICD-10-CM | POA: Diagnosis not present

## 2018-07-21 LAB — POCT INR: INR: 2.4 (ref 2.0–3.0)

## 2018-07-21 NOTE — Patient Instructions (Addendum)
Description   Continue taking Coumadin 1 tablet (5mg ) daily.  Recheck in 4 weeks.  Continue eating 2 servings of green leafy vegetables each week. Call Coumadin clinic with any questions new medications or if scheduled for any procedures [phone]

## 2018-08-18 ENCOUNTER — Ambulatory Visit (INDEPENDENT_AMBULATORY_CARE_PROVIDER_SITE_OTHER): Payer: Medicare Other

## 2018-08-18 ENCOUNTER — Other Ambulatory Visit: Payer: Medicare Other | Admitting: *Deleted

## 2018-08-18 DIAGNOSIS — E785 Hyperlipidemia, unspecified: Secondary | ICD-10-CM

## 2018-08-18 DIAGNOSIS — Z5181 Encounter for therapeutic drug level monitoring: Secondary | ICD-10-CM | POA: Diagnosis not present

## 2018-08-18 DIAGNOSIS — I2510 Atherosclerotic heart disease of native coronary artery without angina pectoris: Secondary | ICD-10-CM

## 2018-08-18 LAB — POCT INR: INR: 1.1 — AB (ref 2.0–3.0)

## 2018-08-18 NOTE — Patient Instructions (Signed)
Description   Take 1.5 tablets today and tomorrow, then resume same dosage 1 tablet (5mg) daily. Recheck in 1 week. Continue eating 2 servings of green leafy vegetables each week. Call Coumadin clinic with any questions new medications or if scheduled for any procedures [phone].      

## 2018-08-19 ENCOUNTER — Other Ambulatory Visit: Payer: Medicare Other

## 2018-08-19 LAB — ALT: ALT: 8 IU/L (ref 0–32)

## 2018-08-19 LAB — LIPID PANEL
CHOL/HDL RATIO: 4.1 ratio (ref 0.0–4.4)
CHOLESTEROL TOTAL: 284 mg/dL — AB (ref 100–199)
HDL: 69 mg/dL (ref >39)
LDL CALC: 187 mg/dL — AB (ref 0–99)
TRIGLYCERIDES: 138 mg/dL (ref 0–149)
VLDL Cholesterol Cal: 28 mg/dL (ref 5–40)

## 2018-08-20 ENCOUNTER — Telehealth: Payer: Self-pay

## 2018-08-20 NOTE — Telephone Encounter (Signed)
Notes recorded by Sigurd Sosapp, Anees Vanecek, RN on 08/20/2018 at 8:54 AM EST The patient has been notified of the result and verbalized understanding. Patient stated that she has NOT been taking her Atorvastatin 80 mg daily. I reviewed this with her and she will begin 11/20, today. She has f/u 12/5. All questions (if any) were answered. Sigurd SosMichael Janus Vlcek, RN 08/20/2018 8:52 AM

## 2018-08-20 NOTE — Telephone Encounter (Signed)
-----   Message from Yvonne Kendallhristopher End, MD sent at 08/19/2018  9:56 PM EST ----- Please let Ms. Pepitone know that her LDL is still very elevated, particularly given her history of coronary artery disease.  Please confirm that she is taking atorvastatin 80 mg daily, as prescribed.  If so, we should refer her to the lipid clinic.

## 2018-08-21 ENCOUNTER — Telehealth: Payer: Self-pay

## 2018-08-21 MED ORDER — ATORVASTATIN CALCIUM 80 MG PO TABS: 80.0000 mg | ORAL_TABLET | Freq: Every day | ORAL | 3 refills | Status: DC

## 2018-08-21 NOTE — Telephone Encounter (Signed)
Pt called into [facility], states her Atorvastatin bottle is 40mg  tablets.  She is taking 2 tablets daily to equal the 80mg  QD that you advised to take on 08/20/18.  It appears she will need a new rx for 80mg  tablets sent into her pharmacy to refill once her 40mg  tablets completed.

## 2018-08-21 NOTE — Telephone Encounter (Signed)
Order placed

## 2018-08-22 ENCOUNTER — Other Ambulatory Visit: Payer: Self-pay | Admitting: Cardiology

## 2018-09-04 ENCOUNTER — Ambulatory Visit (INDEPENDENT_AMBULATORY_CARE_PROVIDER_SITE_OTHER): Payer: Medicare Other | Admitting: Pharmacist

## 2018-09-04 ENCOUNTER — Ambulatory Visit: Payer: Medicare Other | Admitting: Internal Medicine

## 2018-09-04 ENCOUNTER — Encounter: Payer: Self-pay | Admitting: Internal Medicine

## 2018-09-04 VITALS — BP 116/58 | HR 70 | Ht 64.0 in | Wt 136.6 lb

## 2018-09-04 DIAGNOSIS — I2510 Atherosclerotic heart disease of native coronary artery without angina pectoris: Secondary | ICD-10-CM | POA: Diagnosis not present

## 2018-09-04 DIAGNOSIS — E785 Hyperlipidemia, unspecified: Secondary | ICD-10-CM | POA: Diagnosis not present

## 2018-09-04 DIAGNOSIS — R42 Dizziness and giddiness: Secondary | ICD-10-CM | POA: Diagnosis not present

## 2018-09-04 DIAGNOSIS — Z5181 Encounter for therapeutic drug level monitoring: Secondary | ICD-10-CM | POA: Diagnosis not present

## 2018-09-04 LAB — POCT INR: INR: 1.6 — AB (ref 2.0–3.0)

## 2018-09-04 NOTE — Patient Instructions (Addendum)
Medication Instructions:  STOP OVER THE COUNTER SLEEP AID  CHECK WITH PHARMACIST ABOUT MELATONIN  If you need a refill on your cardiac medications before your next appointment, please call your pharmacy.   Lab work:8 WEEKS ALT LIPID PANEL  If you have labs (blood work) drawn today and your tests are completely normal, you will receive your results only by: Marland Kitchen. MyChart Message (if you have MyChart) OR . A paper copy in the mail If you have any lab test that is abnormal or we need to change your treatment, we will call you to review the results.  Testing/Procedures: NONE  Follow-Up: [facility], you and your health needs are our priority.  As part of our continuing mission to provide you with exceptional heart care, we have created designated Provider Care Teams.  These Care Teams include your primary Cardiologist (physician) and Advanced Practice Providers (APPs -  Physician Assistants and Nurse Practitioners) who all work together to provide you with the care you need, when you need it. You will need a follow up appointment in 6 months.  Please call our office 2 months in advance to schedule this appointment.  You may see Yvonne Kendallhristopher End, MD or one of the following Advanced Practice Providers on your designated Care Team:   Nicolasa Duckinghristopher Berge, NP Eula Listenyan Dunn, PA-C . Marisue IvanJacquelyn Visser, PA-C  Any Other Special Instructions Will Be Listed Below (If Applicable).  DRINK MORE WATER

## 2018-09-04 NOTE — Progress Notes (Signed)
Follow-up Outpatient Visit Date: 09/04/2018  Primary Care Provider: Corine Shelter, MD [address] [place] [address]  Chief Complaint: Follow-up dizziness  HPI:  Erin Roberson is a 84 y.o. year-old female with history of CAD s/p PCTA LAD (1992), mild aortic regurgitation, unprovoked PE on coumadin, hyperlipidemia, h/o uterine cancer, dementia, fibromyalgia, and atypical chest pain, who presents for follow-up of coronary artery disease.  I last saw Erin Roberson in August, at which time she reported frequent lightheadedness during the day.  She had previously been encouraged to increase her fluid intake to combat the lightheadedness, though she continued to drink only small amounts during the day.  Today, Erin Roberson reports that she has been feeling better with less dizziness.  However, her daughter notes that Erin Roberson continues to seem off balance, usually in the mornings.  The patient does not feel lightheaded nor has she passed out.  Her sensation of being off balance improves throughout the day.  She is only drinking two 6 ounce glasses of water a day.  She also notes trouble sleeping at night for which she will intermittently take an over-the-counter sleep aid containing an antihistamine.  She denies chest pain, shortness of breath, palpitations, orthopnea, and edema.  She recently restarted atorvastatin due to significantly elevated LDL in the setting of coronary artery disease.  She is tolerating this well.  She has not had any falls or bleeding, remaining on warfarin for history of unprovoked PE.  --------------------------------------------------------------------------------------------------  Past Medical History:  Diagnosis Date  . Arthritis   . Breast mass    left breast  . Clotting disorder (HCC)   . Coronary atherosclerosis    a. s/p PCTA LAD (1992). b. Last cath in 10/11 with nonobstructive disease, EF 60% by LV-gram.  . Dementia (HCC)   . Depression   .  Diverticulosis of colon (without mention of hemorrhage)   . Fibromyalgia   . Glaucoma   . Hemorrhage of rectum and anus   . History of uterine cancer   . Iron deficiency anemia, unspecified   . Leukopenia    chronic--benign  . Other pulmonary embolism and infarction    2011  . Pure hypercholesterolemia   . Thyroid disease   . UTI (lower urinary tract infection)    Past Surgical History:  Procedure Laterality Date  . ABDOMINAL HYSTERECTOMY    . CARDIAC CATHETERIZATION  07/25/10   luminal irregularities in multiple vessels, no sig stenosis, EF 60%  . CATARACT EXTRACTION  2004   right  . COLONOSCOPY  07/01/2007   diverticulosis  . CORONARY ANGIOPLASTY  1992   LAD  . KIDNEY SURGERY     stent placed  . NECK SURGERY    . thyroid needle aspiration  12/2007   right, hyperplastic nodule  . UPPER GASTROINTESTINAL ENDOSCOPY  07/16/2007   tortous esophagus with spasms    Current Meds  Medication Sig  . atorvastatin (LIPITOR) 80 MG tablet Take 1 tablet (80 mg total) by mouth daily.  Marland Kitchen NAMZARIC 7-10 MG CP24 Take 1 capsule by mouth daily.  Marland Kitchen warfarin (COUMADIN) 5 MG tablet Take 1 tablet daily as directed by [facility]in Clinic.    Allergies: Patient has no known allergies.  Social History   Tobacco Use  . Smoking status: Never Smoker  . Smokeless tobacco: Never Used  Substance Use Topics  . Alcohol use: No  . Drug use: No    Family History  Problem Relation Age of Onset  . Diabetes Mother  deceased age 17105  . Hypertension Mother   . Pancreatic cancer Father        deceased age 82  . Emphysema Brother        deceased  . Alcohol abuse Brother   . Osteoporosis Unknown   . Stroke Maternal Aunt   . Colon cancer Neg Hx   . Heart attack Neg Hx     Review of Systems: A 12-system review of systems was performed and was negative except as noted in the HPI.  --------------------------------------------------------------------------------------------------  Physical  Exam: BP (!) 116/58   Pulse 70   Ht 5\' 4"  (1.626 m)   Wt 136 lb 9.6 oz (62 kg)   SpO2 98%   BMI 23.45 kg/m   General: NAD. HEENT: No conjunctival pallor or scleral icterus. Moist mucous membranes.  OP clear. Neck: Supple without lymphadenopathy, thyromegaly, JVD, or HJR. Lungs: Normal work of breathing. Clear to auscultation bilaterally without wheezes or crackles. Heart: Regular rate and rhythm without murmurs, rubs, or gallops. Non-displaced PMI. Abd: Bowel sounds present. Soft, NT/ND without hepatosplenomegaly Ext: No lower extremity edema. Radial, PT, and DP pulses are 2+ bilaterally. Skin: Warm and dry without rash.  Lab Results  Component Value Date   WBC 3.0 (L) 05/15/2018   HGB 13.1 05/15/2018   HCT 38.2 05/15/2018   MCV 95 05/15/2018   PLT 215 05/15/2018    Lab Results  Component Value Date   NA 142 05/15/2018   K 4.0 05/15/2018   CL 108 (H) 05/15/2018   CO2 23 05/15/2018   BUN 17 05/15/2018   CREATININE 1.00 05/15/2018   GLUCOSE 72 05/15/2018   ALT 8 08/18/2018    Lab Results  Component Value Date   CHOL 284 (H) 08/18/2018   HDL 69 08/18/2018   LDLCALC 187 (H) 08/18/2018   LDLDIRECT 177 (H) 05/15/2018   TRIG 138 08/18/2018   CHOLHDL 4.1 08/18/2018    --------------------------------------------------------------------------------------------------  ASSESSMENT AND PLAN: Dizziness Longstanding and stable to slightly improved from prior visits.  I suspect this is multifactorial.  [name] continues to have poor oral intake of fluids.  I have encouraged her to increase this.  I am also concerned that use of a sleep aid may be contributing to her morning dizziness.  I have encouraged her to stop using this.  She could try using melatonin to help her fall asleep if needed.  We also discussed importance of good sleep hygiene.  I advised [name] to discuss this further with her PCP including the utility of neurology consultation.  Coronary artery  disease No symptoms to suggest worsening coronary insufficiency.  Continue secondary prevention with statin therapy.  Patient is not on aspirin, given anticoagulation with warfarin.  Hyperlipidemia LDL not well controlled on last check last month.  Patient was not taking her statin at that time.  She is now back on atorvastatin 80 mg daily, which she seems to be tolerating well.  We will plan to repeat a lipid panel and ALT in about 2 months.  Follow-up: Return to see me in 6 months in the [place] office.  Yvonne Kendallhristopher Elyce Zollinger, MD 09/05/2018 7:25 AM

## 2018-09-04 NOTE — Patient Instructions (Signed)
Description   Take 1.5 tablets today and tomorrow, then resume same dosage 1 tablet (5mg ) daily.  Recheck in 10 days.  Continue eating 2 servings of green leafy vegetables each week. Call Coumadin clinic with any questions new medications or if scheduled for any procedures [phone]

## 2018-09-05 ENCOUNTER — Encounter: Payer: Self-pay | Admitting: Internal Medicine

## 2018-09-05 DIAGNOSIS — I2510 Atherosclerotic heart disease of native coronary artery without angina pectoris: Secondary | ICD-10-CM | POA: Insufficient documentation

## 2018-09-05 DIAGNOSIS — E785 Hyperlipidemia, unspecified: Secondary | ICD-10-CM | POA: Insufficient documentation

## 2018-09-15 ENCOUNTER — Ambulatory Visit: Payer: Medicare Other | Admitting: *Deleted

## 2018-09-15 DIAGNOSIS — Z5181 Encounter for therapeutic drug level monitoring: Secondary | ICD-10-CM

## 2018-09-15 LAB — POCT INR: INR: 2.1 (ref 2.0–3.0)

## 2018-09-15 NOTE — Patient Instructions (Signed)
Description   Continue taking 1 tablet (5mg ) daily.  Recheck in 2 weeks.  Continue eating 2 servings of green leafy vegetables each week. Call Coumadin clinic with any questions new medications or if scheduled for any procedures [phone]

## 2018-09-30 ENCOUNTER — Other Ambulatory Visit: Payer: Self-pay | Admitting: Cardiology

## 2018-10-08 ENCOUNTER — Ambulatory Visit (INDEPENDENT_AMBULATORY_CARE_PROVIDER_SITE_OTHER): Payer: Medicare Other | Admitting: *Deleted

## 2018-10-08 DIAGNOSIS — Z5181 Encounter for therapeutic drug level monitoring: Secondary | ICD-10-CM

## 2018-10-08 LAB — POCT INR: INR: 1.1 — AB (ref 2.0–3.0)

## 2018-10-08 NOTE — Patient Instructions (Signed)
Description   Today and tomorrow take 1.5 tablets of Warfarin then continue taking 1 tablet (5mg) daily.  Recheck in 1 week.  Continue eating 2 servings of green leafy vegetables each week. Call Coumadin clinic with any questions new medications or if scheduled for any procedures [phone]      

## 2018-10-30 ENCOUNTER — Other Ambulatory Visit: Payer: Medicare Other | Admitting: *Deleted

## 2018-10-30 ENCOUNTER — Ambulatory Visit: Payer: Medicare Other | Admitting: *Deleted

## 2018-10-30 DIAGNOSIS — Z5181 Encounter for therapeutic drug level monitoring: Secondary | ICD-10-CM

## 2018-10-30 DIAGNOSIS — E785 Hyperlipidemia, unspecified: Secondary | ICD-10-CM

## 2018-10-30 LAB — POCT INR: INR: 1.3 — AB (ref 2.0–3.0)

## 2018-10-30 NOTE — Patient Instructions (Signed)
Description   Today and tomorrow take 1.5 tablets of Warfarin then continue taking 1 tablet (5mg ) daily.  Recheck in 1 week.  Continue eating 2 servings of green leafy vegetables each week. Call Coumadin clinic with any questions new medications or if scheduled for any procedures [phone]

## 2018-10-31 LAB — LIPID PANEL
CHOL/HDL RATIO: 3.4 ratio (ref 0.0–4.4)
Cholesterol, Total: 202 mg/dL — ABNORMAL HIGH (ref 100–199)
HDL: 60 mg/dL (ref >39)
LDL Calculated: 117 mg/dL — ABNORMAL HIGH (ref 0–99)
Triglycerides: 127 mg/dL (ref 0–149)
VLDL Cholesterol Cal: 25 mg/dL (ref 5–40)

## 2018-10-31 LAB — ALT: ALT: 9 IU/L (ref 0–32)

## 2018-11-03 ENCOUNTER — Telehealth: Payer: Self-pay | Admitting: *Deleted

## 2018-11-03 NOTE — Telephone Encounter (Signed)
Patient daughter returning call to discuss meds.  Patient is not currently taking atorvastatin and daughter is confirming patient will now start taking this med.

## 2018-11-03 NOTE — Telephone Encounter (Signed)
-----   Message from Yvonne Kendall, MD sent at 11/03/2018  6:49 AM EST ----- Please let Erin Roberson know that her LDL has improved significantly with reinitiation of atorvastatin, though it is still above goal at 117 (target less than 70).  If she has been taking atorvastatin 80 mg daily as prescribed, I suggest that we start ezetimibe 10 mg daily.  If she has not been taking the atorvastatin regularly, I encouraged her to do so.  We should repeat a lipid panel and ALT in about 3 months (can be drawn in [place] or [place]).

## 2018-11-03 NOTE — Telephone Encounter (Signed)
Spoke with patient's daughter. She verbalized understanding of results and plan of care. She is on her way to her mother's house now who lives in [place]. She is going to see if she's been taking the atorvastatin and call us back for further instruction. She was very Adult nurse.

## 2018-11-03 NOTE — Telephone Encounter (Signed)
No answer with patient's daughter, [name], ok dpr. Left message to call back.

## 2018-11-03 NOTE — Telephone Encounter (Signed)
Results called to pt. Pt verbalized understanding. Patient not completely sure if she is taking Lipitor. She has history of dementia. She said to call her daughter later this morning around 10 am to let her know as well.

## 2018-11-03 NOTE — Telephone Encounter (Signed)
Pt daughter is returning your call. States she will call us back.

## 2018-11-04 NOTE — Telephone Encounter (Signed)
Spoke with daughter. Patient had not been taking her atorvastatin. She will go back to taking it. She is due for 6 month follow up in May/June. Went ahead and scheduled appointment for patient and they will plan to be fasting that day so she can have lab work drawn during office visit. She was very Adult nurse.

## 2018-11-05 ENCOUNTER — Ambulatory Visit: Payer: Medicare Other | Admitting: Pharmacist

## 2018-11-05 DIAGNOSIS — Z5181 Encounter for therapeutic drug level monitoring: Secondary | ICD-10-CM | POA: Diagnosis not present

## 2018-11-05 LAB — POCT INR: INR: 2 (ref 2.0–3.0)

## 2018-11-05 NOTE — Patient Instructions (Signed)
Description   Continue taking 1 tablet (5mg ) daily.  Recheck in 3 weeks.  Continue eating 2 servings of green leafy vegetables each week. Call Coumadin clinic with any questions new medications or if scheduled for any procedures [phone]

## 2018-12-09 ENCOUNTER — Ambulatory Visit: Payer: Medicare Other | Admitting: *Deleted

## 2018-12-09 DIAGNOSIS — Z5181 Encounter for therapeutic drug level monitoring: Secondary | ICD-10-CM

## 2018-12-09 LAB — POCT INR: INR: 1.3 — AB (ref 2.0–3.0)

## 2018-12-09 NOTE — Patient Instructions (Signed)
Description   Today and tomorrow take 1.5 tablets then continue taking 1 tablet (5mg ) daily.  Recheck in 1 week.  Continue eating 2 servings of green leafy vegetables each week. Call Coumadin clinic with any questions new medications or if scheduled for any procedures [phone]

## 2018-12-21 ENCOUNTER — Encounter (HOSPITAL_COMMUNITY): Payer: Self-pay

## 2018-12-21 ENCOUNTER — Other Ambulatory Visit: Payer: Self-pay

## 2018-12-21 ENCOUNTER — Emergency Department (HOSPITAL_COMMUNITY)
Admission: EM | Admit: 2018-12-21 | Discharge: 2018-12-22 | Disposition: A | Discharge status: Home / Self Care | Payer: Medicare Other | Attending: Emergency Medicine | Admitting: Emergency Medicine

## 2018-12-21 ENCOUNTER — Emergency Department (HOSPITAL_COMMUNITY): Payer: Medicare Other

## 2018-12-21 DIAGNOSIS — I2510 Atherosclerotic heart disease of native coronary artery without angina pectoris: Secondary | ICD-10-CM | POA: Diagnosis not present

## 2018-12-21 DIAGNOSIS — E860 Dehydration: Secondary | ICD-10-CM | POA: Insufficient documentation

## 2018-12-21 DIAGNOSIS — Z7901 Long term (current) use of anticoagulants: Secondary | ICD-10-CM | POA: Insufficient documentation

## 2018-12-21 DIAGNOSIS — Z8542 Personal history of malignant neoplasm of other parts of uterus: Secondary | ICD-10-CM | POA: Insufficient documentation

## 2018-12-21 DIAGNOSIS — E079 Disorder of thyroid, unspecified: Secondary | ICD-10-CM | POA: Diagnosis not present

## 2018-12-21 DIAGNOSIS — R55 Syncope and collapse: Secondary | ICD-10-CM | POA: Diagnosis not present

## 2018-12-21 LAB — CBC WITH DIFFERENTIAL/PLATELET
Abs Immature Granulocytes: 0.01 10*3/uL (ref 0.00–0.07)
Basophils Absolute: 0 10*3/uL (ref 0.0–0.1)
Basophils Relative: 1 %
Eosinophils Absolute: 0.1 10*3/uL (ref 0.0–0.5)
Eosinophils Relative: 2 %
HCT: 38.6 % (ref 36.0–46.0)
Hemoglobin: 12.4 g/dL (ref 12.0–15.0)
IMMATURE GRANULOCYTES: 0 %
Lymphocytes Relative: 46 %
Lymphs Abs: 1.8 10*3/uL (ref 0.7–4.0)
MCH: 31.4 pg (ref 26.0–34.0)
MCHC: 32.1 g/dL (ref 30.0–36.0)
MCV: 97.7 fL (ref 80.0–100.0)
MONOS PCT: 7 %
Monocytes Absolute: 0.3 10*3/uL (ref 0.1–1.0)
Neutro Abs: 1.7 10*3/uL (ref 1.7–7.7)
Neutrophils Relative %: 44 %
Platelets: 182 10*3/uL (ref 150–400)
RBC: 3.95 MIL/uL (ref 3.87–5.11)
RDW: 12.5 % (ref 11.5–15.5)
WBC: 3.8 10*3/uL — ABNORMAL LOW (ref 4.0–10.5)
nRBC: 0 % (ref 0.0–0.2)

## 2018-12-21 LAB — CBG MONITORING, ED: Glucose-Capillary: 76 mg/dL (ref 70–99)

## 2018-12-21 LAB — BASIC METABOLIC PANEL
Anion gap: 7 (ref 5–15)
BUN: 20 mg/dL (ref 8–23)
CO2: 23 mmol/L (ref 22–32)
Calcium: 8.5 mg/dL — ABNORMAL LOW (ref 8.9–10.3)
Chloride: 112 mmol/L — ABNORMAL HIGH (ref 98–111)
Creatinine, Ser: 1.18 mg/dL — ABNORMAL HIGH (ref 0.44–1.00)
GFR calc Af Amer: 46 mL/min — ABNORMAL LOW (ref >60)
GFR calc non Af Amer: 40 mL/min — ABNORMAL LOW (ref >60)
Glucose, Bld: 123 mg/dL — ABNORMAL HIGH (ref 70–99)
Potassium: 3.9 mmol/L (ref 3.5–5.1)
Sodium: 142 mmol/L (ref 135–145)

## 2018-12-21 LAB — I-STAT TROPONIN, ED: Troponin i, poc: 0 ng/mL (ref 0.00–0.08)

## 2018-12-21 MED ORDER — SODIUM CHLORIDE 0.9 % IV BOLUS
1000.0000 mL | Freq: Once | INTRAVENOUS | Status: AC
  Administered 2018-12-21: 1000 mL via INTRAVENOUS

## 2018-12-21 NOTE — ED Provider Notes (Signed)
[facility] EMERGENCY DEPARTMENT Provider Note   CSN: [id number] Arrival date & time: 12/21/18  2040    History   Chief Complaint Chief Complaint  Patient presents with  . Near Syncope    HPI Erin Roberson is a 85 y.o. female.  She was brought in by EMS after feeling weak and dizzy while grocery shopping [employer] [place].  She just felt generally weak and felt like she was going to pass out and was assisted there.  There was no fall and no syncope.  EMS said they found her diaphoretic and hypotensive with blood pressure in the 60s.  She was awake though.  She responded to IV fluids and is awake and alert on arrival here.  She said she only ate some cereal and a banana this morning and has not eaten since then.  She said she is just been kind of tired and did not feel like eating.  No recent illness no fevers no cough no shortness of breath no chest pain no abdominal pain vomiting or diarrhea.  No urinary symptoms.     The history is provided by the patient.  Near Syncope  This is a new problem. The current episode started less than 1 hour ago. The problem has been resolved. Pertinent negatives include no chest pain, no abdominal pain, no headaches and no shortness of breath. Nothing aggravates the symptoms. The symptoms are relieved by position. She has tried rest for the symptoms. The treatment provided moderate relief.    Past Medical History:  Diagnosis Date  . Arthritis   . Breast mass    left breast  . Clotting disorder (HCC)   . Coronary atherosclerosis    a. s/p PCTA LAD (1992). b. Last cath in 10/11 with nonobstructive disease, EF 60% by LV-gram.  . Dementia (HCC)   . Depression   . Diverticulosis of colon (without mention of hemorrhage)   . Fibromyalgia   . Glaucoma   . Hemorrhage of rectum and anus   . History of uterine cancer   . Iron deficiency anemia, unspecified   . Leukopenia    chronic--benign  . Other pulmonary embolism and infarction    2011   . Pure hypercholesterolemia   . Thyroid disease   . UTI (lower urinary tract infection)     Patient Active Problem List   Diagnosis Date Noted  . Coronary artery disease involving native coronary artery of native heart without angina pectoris 09/05/2018  . Hyperlipidemia LDL goal <70 09/05/2018  . Near syncope 11/10/2016  . Bradycardia 11/10/2016  . Chronic anticoagulation - Coumadin for history of PE 11/08/2015  . Encounter for therapeutic drug monitoring 10/30/2013  . Chronic LLQ pain 12/13/2011  . Dementia (HCC) 12/13/2011  . Breast lesion, Left, 12 o'clock. 10/28/2011  . Constipation, chronic 08/09/2011  . Diverticulosis of colon 08/09/2011  . Long term current use of anticoagulant 11/18/2010  . PULMONARY EMBOLISM 08/15/2010  . HEMATURIA, HX OF 08/15/2010  . Dizziness 08/30/2009  . CHEST PAIN, ATYPICAL 08/30/2009  . UNSTEADY GAIT 08/17/2009  . HYPERCHOLESTEROLEMIA 12/18/2007  . LEUKOPENIA, MILD 12/18/2007  . DEPRESSION 12/18/2007  . Coronary atherosclerosis 12/18/2007    Past Surgical History:  Procedure Laterality Date  . ABDOMINAL HYSTERECTOMY    . CARDIAC CATHETERIZATION  07/25/10   luminal irregularities in multiple vessels, no sig stenosis, EF 60%  . CATARACT EXTRACTION  2004   right  . COLONOSCOPY  07/01/2007   diverticulosis  . CORONARY ANGIOPLASTY  1992  LAD  . KIDNEY SURGERY     stent placed  . NECK SURGERY    . thyroid needle aspiration  12/2007   right, hyperplastic nodule  . UPPER GASTROINTESTINAL ENDOSCOPY  07/16/2007   tortous esophagus with spasms     OB History   No obstetric history on file.      Home Medications    Prior to Admission medications   Medication Sig Start Date End Date Taking? Authorizing Provider  atorvastatin (LIPITOR) 80 MG tablet Take 1 tablet (80 mg total) by mouth daily. 08/21/18 11/19/18  End, Cristal Deer, MD  Melatonin 3 MG TABS Take 1 tablet by mouth daily.    [provider]  NAMZARIC 7-10 MG CP24 Take  1 capsule by mouth daily. 10/06/16   [provider]  warfarin (COUMADIN) 5 MG tablet TAKE AS DIRECTED BY COUMADIN CLINIC 10/02/18   End, Cristal Deer, MD    Family History Family History  Problem Relation Age of Onset  . Diabetes Mother        deceased age 34  . Hypertension Mother   . Pancreatic cancer Father        deceased age 85  . Emphysema Brother        deceased  . Alcohol abuse Brother   . Osteoporosis Other   . Stroke Maternal Aunt   . Colon cancer Neg Hx   . Heart attack Neg Hx     Social History Social History   Tobacco Use  . Smoking status: Never Smoker  . Smokeless tobacco: Never Used  Substance Use Topics  . Alcohol use: No  . Drug use: No     Allergies   Patient has no known allergies.   Review of Systems Review of Systems  Constitutional: Negative for fever.  HENT: Negative for sore throat.   Eyes: Negative for visual disturbance.  Respiratory: Negative for shortness of breath.   Cardiovascular: Positive for near-syncope. Negative for chest pain.  Gastrointestinal: Negative for abdominal pain.  Genitourinary: Negative for dysuria.  Musculoskeletal: Negative for back pain.  Skin: Negative for rash.  Neurological: Negative for headaches.     Physical Exam Updated Vital Signs BP (!) 130/52 (BP Location: Left Arm)   Pulse 64   Temp 97.7 F (36.5 C) (Oral)   Resp 19   Wt 61.2 kg   SpO2 96%   BMI 23.17 kg/m   Physical Exam Vitals signs and nursing note reviewed.  Constitutional:      General: She is not in acute distress.    Appearance: She is well-developed.  HENT:     Head: Normocephalic and atraumatic.  Eyes:     Conjunctiva/sclera: Conjunctivae normal.  Neck:     Musculoskeletal: Neck supple.  Cardiovascular:     Rate and Rhythm: Normal rate and regular rhythm.     Heart sounds: No murmur.  Pulmonary:     Effort: Pulmonary effort is normal. No respiratory distress.     Breath sounds: Normal breath sounds.   Abdominal:     Palpations: Abdomen is soft.     Tenderness: There is no abdominal tenderness.  Musculoskeletal:        General: No signs of injury.     Right lower leg: No edema.     Left lower leg: No edema.  Skin:    General: Skin is warm and dry.     Capillary Refill: Capillary refill takes less than 2 seconds.  Neurological:     General: No focal deficit  present.     Mental Status: She is alert.     Sensory: No sensory deficit.     Motor: No weakness.      ED Treatments / Results  Labs (all labs ordered are listed, but only abnormal results are displayed) Labs Reviewed  BASIC METABOLIC PANEL - Abnormal; Notable for the following components:      Result Value   Chloride 112 (*)    Glucose, Bld 123 (*)    Creatinine, Ser 1.18 (*)    Calcium 8.5 (*)    GFR calc non Af Amer 40 (*)    GFR calc Af Amer 46 (*)    All other components within normal limits  CBC WITH DIFFERENTIAL/PLATELET - Abnormal; Notable for the following components:   WBC 3.8 (*)    All other components within normal limits  URINALYSIS, ROUTINE W REFLEX MICROSCOPIC - Abnormal; Notable for the following components:   Ketones, ur 80 (*)    Leukocytes,Ua SMALL (*)    Bacteria, UA RARE (*)    All other components within normal limits  I-STAT TROPONIN, ED  CBG MONITORING, ED    EKG EKG Interpretation  Date/Time:  Sunday December 21 2018 20:42:07 EDT Ventricular Rate:  64 PR Interval:    QRS Duration: 89 QT Interval:  424 QTC Calculation: 438 R Axis:   17 Text Interpretation:  Sinus rhythm Borderline low voltage, extremity leads Abnormal R-wave progression, early transition similar to prior 2/18 Confirmed by Meridee Score [id number]) on 12/21/2018 8:52:14 PM   Radiology Dg Chest Port 1 View  Result Date: 12/21/2018 CLINICAL DATA:  Weakness EXAM: PORTABLE CHEST 1 VIEW COMPARISON:  12/11/2016 FINDINGS: Cardiac shadow is within normal limits. Aortic calcifications are again noted. Mild blunting of left  costophrenic angle is noted consistent with small effusion. No focal infiltrate is noted. No bony abnormality is seen. IMPRESSION: Minimal left pleural effusion. Electronically Signed   By: Alcide Clever M.D.   On: 12/21/2018 21:25    Procedures Procedures (including critical care time)  Medications Ordered in ED Medications  sodium chloride 0.9 % bolus 1,000 mL (0 mLs Intravenous Stopped 12/21/18 2307)     Initial Impression / Assessment and Plan / ED Course  I have reviewed the triage vital signs and the nursing notes.  Pertinent labs & imaging results that were available during my care of the patient were reviewed by me and considered in my medical decision making (see chart for details).  Clinical Course as of Dec 21 1156  Sun Dec 21, 2018  2347 Patient had a near syncopal spell at the grocery store in the setting of having eaten very little today.  Her lab work is fairly unremarkable.  She is felt better after receiving some IV fluids.  Will check some orthostatics and if improved potentially can be discharged.   [MB]  Mon Dec 22, 2018  0006 Patient said she is feels back to baseline although the nurse said she did drop 20 points from 1 70-1 50 and her blood pressure.  She is eating a sandwich now we still need to check a urine.  I think if she can get up and she is back to baseline walking that her symptoms can be chalked up to some dehydration and not eating much today.  Her daughter called and would be willing to take her home.   [MB]    Clinical Course User Index [MB] Terrilee Files, MD        Final  Clinical Impressions(s) / ED Diagnoses   Final diagnoses:  Near syncope  Dehydration    ED Discharge Orders    None       Terrilee Files, MD 12/22/18 1159

## 2018-12-21 NOTE — ED Triage Notes (Signed)
Pt grocery shopping when she felt weak and had to sit down. EMS called

## 2018-12-21 NOTE — ED Notes (Signed)
Spoke to daughter Shemeka Wynn per patient request

## 2018-12-21 NOTE — ED Notes (Signed)
During orthostatic pressure pt was unsteady on her feet while standing.

## 2018-12-22 LAB — URINALYSIS, ROUTINE W REFLEX MICROSCOPIC
Bilirubin Urine: NEGATIVE
Glucose, UA: NEGATIVE mg/dL
Hgb urine dipstick: NEGATIVE
Ketones, ur: 80 mg/dL — AB
Nitrite: NEGATIVE
Protein, ur: NEGATIVE mg/dL
Specific Gravity, Urine: 1.019 (ref 1.005–1.030)
pH: 5 (ref 5.0–8.0)

## 2018-12-22 NOTE — ED Notes (Signed)
Pt walked unassisted down the hall without any event.

## 2018-12-22 NOTE — Discharge Instructions (Signed)
You were seen in the emergency department for feeling weak like you are going to pass out while shopping today.  Your blood pressure was low when the paramedics got there.  You were given some fluids and some food and were feeling better.  Please continue to stay well-hydrated and follow-up with your primary care doctor.  Return if any concerns.

## 2018-12-22 NOTE — ED Provider Notes (Signed)
Patient seen initially by Dr. Charm Barges, signed out to me.  Urinalysis was pending at time of signout.  This has been reviewed, no evidence of infection.  Patient was seen initially for a near syncopal episode.  Her work-up has been unremarkable.  Vital signs are normal aside from very slight hypertension.  She has ambulated here in the ER without difficulty.  She will be added appropriate for discharge, outpatient follow-up as needed.   Gilda Crease, MD 12/22/18 [phone]

## 2019-01-15 ENCOUNTER — Telehealth: Payer: Self-pay

## 2019-01-15 NOTE — Telephone Encounter (Signed)
lmom for prescreen  

## 2019-01-16 ENCOUNTER — Ambulatory Visit (INDEPENDENT_AMBULATORY_CARE_PROVIDER_SITE_OTHER): Payer: Medicare Other | Admitting: Pharmacist

## 2019-01-16 ENCOUNTER — Other Ambulatory Visit: Payer: Self-pay

## 2019-01-16 DIAGNOSIS — Z5181 Encounter for therapeutic drug level monitoring: Secondary | ICD-10-CM

## 2019-01-16 LAB — POCT INR: INR: 2.4 (ref 2.0–3.0)

## 2019-01-16 NOTE — Telephone Encounter (Signed)

## 2019-02-06 ENCOUNTER — Telehealth: Payer: Self-pay

## 2019-02-06 NOTE — Telephone Encounter (Signed)

## 2019-02-09 ENCOUNTER — Other Ambulatory Visit: Payer: Self-pay

## 2019-02-09 ENCOUNTER — Ambulatory Visit (INDEPENDENT_AMBULATORY_CARE_PROVIDER_SITE_OTHER): Payer: Medicare Other | Admitting: Pharmacist

## 2019-02-09 DIAGNOSIS — Z5181 Encounter for therapeutic drug level monitoring: Secondary | ICD-10-CM | POA: Diagnosis not present

## 2019-02-09 LAB — POCT INR: INR: 1 — AB (ref 2.0–3.0)

## 2019-02-10 ENCOUNTER — Telehealth: Payer: Self-pay | Admitting: Internal Medicine

## 2019-02-10 ENCOUNTER — Telehealth: Payer: Self-pay

## 2019-02-10 NOTE — Telephone Encounter (Signed)
New message:    Patient calling concerning lab results and would like for some to call concering what medication patient should be taken.

## 2019-02-10 NOTE — Telephone Encounter (Signed)
Called patient.  No answer. LMOV.  Patient needs to be changed to an Evisit.  Telephone or Video.

## 2019-02-10 NOTE — Telephone Encounter (Signed)
I attempted to call [name] back (ok per DPR).  No answer & her voice mail box is full.  Will try her back at a later time.

## 2019-02-11 ENCOUNTER — Telehealth: Payer: Self-pay

## 2019-02-11 NOTE — Patient Instructions (Signed)
Description   Spoke to daughter on 02/11/19, she has been calling pt since she lives in [place] and reminding pt to take meds each evening since last INR check.  Will have pt continue taking 1 tablet (5mg ) daily.  Recheck in 1 week.  Continue eating 2 servings of green leafy vegetables each week. Call Coumadin clinic with any questions new medications or if scheduled for any procedures [phone]. Daughter will discuss DOAC with patient

## 2019-02-11 NOTE — Telephone Encounter (Deleted)
Virtual Visit Pre-Appointment Phone Call  "(Name), I am calling you today to discuss your upcoming appointment. We are currently trying to limit exposure to the virus that causes COVID-19 by seeing patients at home rather than in the office."  1. "What is the BEST phone number to call the day of the visit?" - include this in appointment notes  2. Do you have or have access to (through a family member/friend) a smartphone with video capability that we can use for your visit?" a. If yes - list this number in appt notes as cell (if different from BEST phone #) and list the appointment type as a VIDEO visit in appointment notes b. If no - list the appointment type as a PHONE visit in appointment notes  3. Confirm consent - "In the setting of the current Covid19 crisis, you are scheduled for a (phone or video) visit with your provider on (date) at (time).  Just as we do with many in-office visits, in order for you to participate in this visit, we must obtain consent.  If you'd like, I can send this to your mychart (if signed up) or email for you to review.  Otherwise, I can obtain your verbal consent now.  All virtual visits are billed to your insurance company just like a normal visit would be.  By agreeing to a virtual visit, we'd like you to understand that the technology does not allow for your provider to perform an examination, and thus may limit your provider's ability to fully assess your condition. If your provider identifies any concerns that need to be evaluated in person, we will make arrangements to do so.  Finally, though the technology is pretty good, we cannot assure that it will always work on either your or our end, and in the setting of a video visit, we may have to convert it to a phone-only visit.  In either situation, we cannot ensure that we have a secure connection.  Are you willing to proceed?" STAFF: Did the patient verbally acknowledge consent to telehealth visit? Document  YES/NO here: ***  4. Advise patient to be prepared - "Two hours prior to your appointment, go ahead and check your blood pressure, pulse, oxygen saturation, and your weight (if you have the equipment to check those) and write them all down. When your visit starts, your provider will ask you for this information. If you have an Apple Watch or Kardia device, please plan to have heart rate information ready on the day of your appointment. Please have a pen and paper handy nearby the day of the visit as well."  5. Give patient instructions for MyChart download to smartphone OR [email or web address] as below if video visit (depending on what platform provider is using)  6. Inform patient they will receive a phone call 15 minutes prior to their appointment time (may be from unknown caller ID) so they should be prepared to answer    TELEPHONE CALL NOTE  Erin Roberson has been deemed a candidate for a follow-up tele-health visit to limit community exposure during the Covid-19 pandemic. I spoke with the patient via phone to ensure availability of phone/video source, confirm preferred email & phone number, and discuss instructions and expectations.  I reminded Erin Roberson to be prepared with any vital sign and/or heart rhythm information that could potentially be obtained via home monitoring, at the time of her visit. I reminded Erin Roberson to expect a phone call prior to  her visit.  Erin Roberson 02/11/2019 2:07 PM    Pending

## 2019-02-11 NOTE — Telephone Encounter (Signed)
I attempted to call [name] back again.  This went to her voice mail, but unable to leave a message again as her VM box is still full.

## 2019-02-12 NOTE — Telephone Encounter (Signed)
Noted  

## 2019-02-12 NOTE — Telephone Encounter (Signed)
Erin Roberson returning call Does not have any questions for [place] at this time  Erin Roberson will be going over consent with patient and will get back in touch around [place] or Tues

## 2019-02-12 NOTE — Telephone Encounter (Signed)
I attempted to call [name] back.  No answer- I was able to leave a message to please call back if she still needed [place].

## 2019-02-16 ENCOUNTER — Telehealth: Payer: Self-pay

## 2019-02-16 ENCOUNTER — Telehealth: Payer: Self-pay | Admitting: Internal Medicine

## 2019-02-16 NOTE — Telephone Encounter (Signed)
lmom for prescreen  

## 2019-02-16 NOTE — Telephone Encounter (Signed)
2nd lmom for prescreen  

## 2019-02-16 NOTE — Telephone Encounter (Signed)
Follow up: ° ° ° °Patient returning your call back. Please call patient. °

## 2019-02-17 ENCOUNTER — Ambulatory Visit (INDEPENDENT_AMBULATORY_CARE_PROVIDER_SITE_OTHER): Payer: Medicare Other | Admitting: Pharmacist

## 2019-02-17 ENCOUNTER — Other Ambulatory Visit: Payer: Self-pay

## 2019-02-17 DIAGNOSIS — Z5181 Encounter for therapeutic drug level monitoring: Secondary | ICD-10-CM

## 2019-02-17 LAB — POCT INR: INR: 1.1 — AB (ref 2.0–3.0)

## 2019-02-20 ENCOUNTER — Telehealth: Payer: Self-pay

## 2019-02-20 NOTE — Telephone Encounter (Signed)
Returned a phone call to the patient regarding coumadin visit but the mailbox was full and unable to accept msgs at this time

## 2019-02-20 NOTE — Telephone Encounter (Signed)

## 2019-02-20 NOTE — Telephone Encounter (Signed)
lmom for prescreen  

## 2019-02-24 ENCOUNTER — Telehealth: Payer: Self-pay | Admitting: Pharmacist

## 2019-02-24 NOTE — Telephone Encounter (Signed)
1. Do you currently have a fever? no (yes = cancel and refer to pcp for e-visit) 2. Have you recently travelled on a cruise, internationally, or to [place], [place], [place], [place], [place], or [place], [place] [employer]) ? no (yes = cancel, stay home, monitor symptoms, and contact pcp or initiate e-visit if symptoms develop) 3. Have you been in contact with someone that is currently pending confirmation of Covid19 testing or has been confirmed to have the Covid19 virus?  no (yes = cancel, stay home, away from tested individual, monitor symptoms, and contact pcp or initiate e-visit if symptoms develop) 4. Are you currently experiencing fatigue or cough? No-just weak (yes = pt should be prepared to have a mask placed at the time of their visit).  Pt. Advised that we are restricting visitors at this time and anyone present in the vehicle should meet the above criteria as well. Advised that visit will be at curbside for finger stick ONLY and will receive call with instructions. Pt also advised to please bring own pen for signature of arrival document.

## 2019-02-24 NOTE — Progress Notes (Signed)
Virtual Visit via Telephone Note   This visit type was conducted due to national recommendations for restrictions regarding the COVID-19 Pandemic (e.g. social distancing) in an effort to limit this patient's exposure and mitigate transmission in our community.  Due to her co-morbid illnesses, this patient is at least at moderate risk for complications without adequate follow up.  This format is felt to be most appropriate for this patient at this time.  The patient did not have access to video technology/had technical difficulties with video requiring transitioning to audio format only (telephone).  All issues noted in this document were discussed and addressed.  No physical exam could be performed with this format.  Please refer to the patient's chart for her  consent to telehealth for [facility].   Date:  02/25/2019   ID:  Erin Roberson, DOB Nov 17, 1923, MRN [id number]  Patient Location: Home Provider Location: Office  PCP:  Corine Shelter, MD  Cardiologist:  Yvonne Kendall, MD  Electrophysiologist:  None   Evaluation Performed:  Follow-Up Visit  Chief Complaint: Dizziness  History of Present Illness:    Erin Roberson is a 85 y.o. female with history of  CAD s/p PCTA LAD (1992),mild aortic regurgitation, unprovokedPE on coumadin,hyperlipidemia, h/o uterine cancer, dementia, fibromyalgia, and atypical chest pain.  I last saw Erin Roberson in December, at which time she was doing well with less dizziness than at prior visits.  She continued to have some balance problems at times.  She was still not drinking much water.  She was seen at the [facility] emergency department in March for near syncope.  She felt weak and dizzy while shopping at [employer].  She was noted to be hypotensive with a systolic blood pressure in the 60s when EMS arrived.  She had not had much to eat or drink that day.  Blood pressure improved with IV fluids.  Today, Erin Roberson reports continued dizziness.  She  describes it as a lightheaded feeling, particularly when she stands up in the morning.  At least half the days of the week, she has to spend much of the morning and early afternoon in bed because of dizziness.  Typically by late afternoon, her dizziness resolves and she is able to go about her usual activities.  She does not drink much water despite prior recommendations.  She has not had chest pain, shortness of breath, or palpitations.  She is tolerating her current medications well.  She has not had any falls, though she often needs to hold onto the wall due to lightheadedness when she stands up.  Erin Roberson also notes a productive cough with "mouthful of phlegm" over the last 2 to 3 weeks.  She has not had any fevers or chills as well as no sick contacts.  The patient does have symptoms concerning for COVID-19 infection (fever, chills, cough, or new shortness of breath).    Past Medical History:  Diagnosis Date  . Arthritis   . Breast mass    left breast  . Clotting disorder (HCC)   . Coronary atherosclerosis    a. s/p PCTA LAD (1992). b. Last cath in 10/11 with nonobstructive disease, EF 60% by LV-gram.  . Dementia (HCC)   . Depression   . Diverticulosis of colon (without mention of hemorrhage)   . Fibromyalgia   . Glaucoma   . Hemorrhage of rectum and anus   . History of uterine cancer   . Iron deficiency anemia, unspecified   .  Leukopenia    chronic--benign  . Other pulmonary embolism and infarction    2011  . Pure hypercholesterolemia   . Thyroid disease   . UTI (lower urinary tract infection)    Past Surgical History:  Procedure Laterality Date  . ABDOMINAL HYSTERECTOMY    . CARDIAC CATHETERIZATION  07/25/10   luminal irregularities in multiple vessels, no sig stenosis, EF 60%  . CATARACT EXTRACTION  2004   right  . COLONOSCOPY  07/01/2007   diverticulosis  . CORONARY ANGIOPLASTY  1992   LAD  . KIDNEY SURGERY     stent placed  . NECK SURGERY    . thyroid needle  aspiration  12/2007   right, hyperplastic nodule  . UPPER GASTROINTESTINAL ENDOSCOPY  07/16/2007   tortous esophagus with spasms     Current Meds  Medication Sig  . atorvastatin (LIPITOR) 80 MG tablet Take 1 tablet (80 mg total) by mouth daily.  Marland Kitchen. NAMZARIC 7-10 MG CP24 Take 1 capsule by mouth daily.  Marland Kitchen. warfarin (COUMADIN) 5 MG tablet TAKE AS DIRECTED BY COUMADIN CLINIC (Patient taking differently: Take 5 mg by mouth daily. )     Allergies:   Patient has no known allergies.   Social History   Tobacco Use  . Smoking status: Never Smoker  . Smokeless tobacco: Never Used  Substance Use Topics  . Alcohol use: No  . Drug use: No     Family Hx: The patient's family history includes Alcohol abuse in her brother; Diabetes in her mother; Emphysema in her brother; Hypertension in her mother; Osteoporosis in an other family member; Pancreatic cancer in her father; Stroke in her maternal aunt. There is no history of Colon cancer or Heart attack.  ROS:   Please see the history of present illness.   All other systems reviewed and are negative.   Prior CV studies:   The following studies were reviewed today:  Echocardiogram (11/11/2016): Normal LV size.  LVEF 60-65% with normal wall motion.  Grade 1 diastolic dysfunction.  Aortic sclerosis with mild aortic regurgitation.  Normal RV size and function.  Pharmacologic MPI (01/17/2016): Low risk study with small fixed defect at the apex.  No ischemia.  LVEF 59%.  Labs/Other Tests and Data Reviewed:    EKG:  No ECG reviewed.  Recent Labs: 10/30/2018: ALT 9 12/21/2018: BUN 20; Creatinine, Ser 1.18; Hemoglobin 12.4; Platelets 182; Potassium 3.9; Sodium 142   Recent Lipid Panel Lab Results  Component Value Date/Time   CHOL 202 (H) 10/30/2018 03:11 PM   TRIG 127 10/30/2018 03:11 PM   HDL 60 10/30/2018 03:11 PM   CHOLHDL 3.4 10/30/2018 03:11 PM   CHOLHDL 3 06/01/2015 04:36 PM   LDLCALC 117 (H) 10/30/2018 03:11 PM   LDLDIRECT 177 (H)  05/15/2018 04:36 PM   LDLDIRECT 138.4 02/13/2013 01:07 PM    Wt Readings from Last 3 Encounters:  02/25/19 130 lb (59 kg)  12/21/18 135 lb (61.2 kg)  09/04/18 136 lb 9.6 oz (62 kg)     Objective:    Vital Signs:  Ht 5\' 4"  (1.626 m)   Wt 130 lb (59 kg)   BMI 22.31 kg/m    VITAL SIGNS:  reviewed  ASSESSMENT & PLAN:    Lightheadedness: This is been a longstanding problem and is likely driven by a combination of dehydration and some autonomic dysfunction.  As at prior visits, I have strongly encouraged Ms. Myrick to increase her fluid intake.  I have recommended that she try drinking 64  ounces of water per day.  We could consider adding fludrocortisone or Midrin, though I worry that she will have significant hypertension when lying down.  Blood pressure during recent ED visit after receiving IV fluids was moderately elevated.  Will defer medication changes today.  Hyperlipidemia: Given history of coronary disease, goal LDL is less than 70.  Most recent lipid panel in February showed an LDL of 117, prompting [place] to ask Ms. Menees to restart her atorvastatin.  We will plan to check a fasting lipid panel and ALT when she follows up in the office in approximately 6 weeks.  Coronary artery disease: No chest pain or shortness of breath to suggest worsening coronary insufficiency.  Continue current medications for secondary prevention.  Ms. Sol is not on aspirin given anticoagulation with warfarin for history of unprovoked PE.  Chronic anticoagulation: Ms. Fussell is on warfarin for history of PE.  Given her age and lightheadedness, I am concerned about the risks for bleeding with anticoagulation, though we will plan to continue this for now and readdress risks and benefits of long-term anticoagulation at follow-up.  Cough: This has been present for 2 to 3 weeks.  Ms. Tolar does not have any other symptoms concerning for COVID-19.  I have asked her to contact her PCP for further evaluation  and recommendations.  COVID-19 Education: The signs and symptoms of COVID-19 were discussed with the patient and how to seek care for testing (follow up with PCP or arrange E-visit).  The importance of social distancing was discussed today.  Time:   Today, I have spent 15 minutes with the patient with telehealth technology discussing the above problems.     Medication Adjustments/Labs and Tests Ordered: Current medicines are reviewed at length with the patient today.  Concerns regarding medicines are outlined above.   Tests Ordered: ALT and FLP when patient returns for f/u in 6 weeks.  Medication Changes: None.  Disposition:  Follow up in 6 week(s)  Signed, Yvonne Kendall, MD  02/25/2019 3:40 PM    [facility]

## 2019-02-25 ENCOUNTER — Other Ambulatory Visit: Payer: Self-pay

## 2019-02-25 ENCOUNTER — Telehealth (INDEPENDENT_AMBULATORY_CARE_PROVIDER_SITE_OTHER): Payer: Medicare Other | Admitting: Internal Medicine

## 2019-02-25 ENCOUNTER — Telehealth: Payer: Self-pay | Admitting: *Deleted

## 2019-02-25 ENCOUNTER — Telehealth: Payer: Medicare Other | Admitting: Internal Medicine

## 2019-02-25 VITALS — Ht 64.0 in | Wt 130.0 lb

## 2019-02-25 DIAGNOSIS — R42 Dizziness and giddiness: Secondary | ICD-10-CM | POA: Insufficient documentation

## 2019-02-25 DIAGNOSIS — I2510 Atherosclerotic heart disease of native coronary artery without angina pectoris: Secondary | ICD-10-CM | POA: Diagnosis not present

## 2019-02-25 DIAGNOSIS — E785 Hyperlipidemia, unspecified: Secondary | ICD-10-CM

## 2019-02-25 DIAGNOSIS — Z7901 Long term (current) use of anticoagulants: Secondary | ICD-10-CM

## 2019-02-25 DIAGNOSIS — R05 Cough: Secondary | ICD-10-CM

## 2019-02-25 DIAGNOSIS — R059 Cough, unspecified: Secondary | ICD-10-CM

## 2019-02-25 NOTE — Telephone Encounter (Signed)
Virtual Visit Pre-Appointment Phone Call  "(Name), I am calling you today to discuss your upcoming appointment. We are currently trying to limit exposure to the virus that causes COVID-19 by seeing patients at home rather than in the office."  1. "What is the BEST phone number to call the day of the visit?" - include this in appointment notes  2. "Do you have or have access to (through a family member/friend) a smartphone with video capability that we can use for your visit?" a. If yes - list this number in appt notes as "cell" (if different from BEST phone #) and list the appointment type as a VIDEO visit in appointment notes b. If no - list the appointment type as a PHONE visit in appointment notes  3. Confirm consent - "In the setting of the current Covid19 crisis, you are scheduled for a (Video) visit with your provider on (02/25/19) at (1:30pm).  Just as we do with many in-office visits, in order for you to participate in this visit, we must obtain consent.  If you'd like, I can send this to your mychart (if signed up) or email for you to review.  Otherwise, I can obtain your verbal consent now.  All virtual visits are billed to your insurance company just like a normal visit would be.  By agreeing to a virtual visit, we'd like you to understand that the technology does not allow for your provider to perform an examination, and thus may limit your provider's ability to fully assess your condition. If your provider identifies any concerns that need to be evaluated in person, we will make arrangements to do so.  Finally, though the technology is pretty good, we cannot assure that it will always work on either your or our end, and in the setting of a video visit, we may have to convert it to a phone-only visit.  In either situation, we cannot ensure that we have a secure connection.  Are you willing to proceed?" YES  4. Advise patient to be prepared - "Two hours prior to your appointment, go ahead  and check your blood pressure, pulse, oxygen saturation, and your weight (if you have the equipment to check those) and write them all down. When your visit starts, your provider will ask you for this information. If you have an Apple Watch or Kardia device, please plan to have heart rate information ready on the day of your appointment. Please have a pen and paper handy nearby the day of the visit as well."  5. Give patient instructions for MyChart download to smartphone OR [email or web address] as below if video visit (depending on what platform provider is using)  6. Inform patient they will receive a phone call 15 minutes prior to their appointment time (may be from unknown caller ID) so they should be prepared to answer    TELEPHONE CALL NOTE  Erin Roberson has been deemed a candidate for a follow-up tele-health visit to limit community exposure during the Covid-19 pandemic. I spoke with the patient via phone to ensure availability of phone/video source, confirm preferred email & phone number, and discuss instructions and expectations.  I reminded Erin Roberson to be prepared with any vital sign and/or heart rhythm information that could potentially be obtained via home monitoring, at the time of her visit. I reminded Erin Roberson to expect a phone call prior to her visit.  LOPEZ, MARINA C, CMA 02/25/2019 1:30 PM   INSTRUCTIONS FOR DOWNLOADING  THE MYCHART APP TO SMARTPHONE  - The patient must first make sure to have activated MyChart and know their login information - If Apple, go to [employer] and type in MyChart in the search bar and download the app. If Android, ask patient to go to [employer] and type in [place] in the search bar and download the app. The app is free but as with any other app downloads, their phone may require them to verify saved payment information or Apple/Android password.  - The patient will need to then log into the app with their MyChart username and password,  and select [facility] as their healthcare provider to link the account. When it is time for your visit, go to the MyChart app, find appointments, and click Begin Video Visit. Be sure to Select Allow for your device to access the Microphone and Camera for your visit. You will then be connected, and your provider will be with you shortly.  **If they have any issues connecting, or need assistance please contact MyChart service desk [phone])**  **If using a computer, in order to ensure the best quality for their visit they will need to use either of the following Internet Browsers: [employer], or Google Chrome**  IF USING DOXIMITY or DOXY.ME - The patient will receive a link just prior to their visit by text.     FULL LENGTH CONSENT FOR TELE-HEALTH VISIT   I hereby voluntarily request, consent and authorize [facility] and its employed or contracted physicians, physician assistants, nurse practitioners or other licensed health care professionals (the Practitioner), to provide me with telemedicine health care services (the "Services") as deemed necessary by the treating Practitioner. I acknowledge and consent to receive the Services by the Practitioner via telemedicine. I understand that the telemedicine visit will involve communicating with the Practitioner through live audiovisual communication technology and the disclosure of certain medical information by electronic transmission. I acknowledge that I have been given the opportunity to request an in-person assessment or other available alternative prior to the telemedicine visit and am voluntarily participating in the telemedicine visit.  I understand that I have the right to withhold or withdraw my consent to the use of telemedicine in the course of my care at any time, without affecting my right to future care or treatment, and that the Practitioner or I may terminate the telemedicine visit at any time. I understand that I  have the right to inspect all information obtained and/or recorded in the course of the telemedicine visit and may receive copies of available information for a reasonable fee.  I understand that some of the potential risks of receiving the Services via telemedicine include:  Marland Kitchen Delay or interruption in medical evaluation due to technological equipment failure or disruption; . Information transmitted may not be sufficient (e.g. poor resolution of images) to allow for appropriate medical decision making by the Practitioner; and/or  . In rare instances, security protocols could fail, causing a breach of personal health information.  Furthermore, I acknowledge that it is my responsibility to provide information about my medical history, conditions and care that is complete and accurate to the best of my ability. I acknowledge that Practitioner's advice, recommendations, and/or decision may be based on factors not within their control, such as incomplete or inaccurate data provided by me or distortions of diagnostic images or specimens that may result from electronic transmissions. I understand that the practice of medicine is not an exact science and that Practitioner  makes no warranties or guarantees regarding treatment outcomes. I acknowledge that I will receive a copy of this consent concurrently upon execution via email to the email address I last provided but may also request a printed copy by calling the office of [facility].    I understand that my insurance will be billed for this visit.   I have read or had this consent read to me. . I understand the contents of this consent, which adequately explains the benefits and risks of the Services being provided via telemedicine.  . I have been provided ample opportunity to ask questions regarding this consent and the Services and have had my questions answered to my satisfaction. . I give my informed consent for the services to be provided through the  use of telemedicine in my medical care  By participating in this telemedicine visit I agree to the above.

## 2019-02-25 NOTE — Patient Instructions (Signed)
Dr End recommends you increase your intake of water by aiming for 64 ounces a day. He also would like you to be fasting for the follow up appointment so that we can get lab work to check your cholesterol if needed at that time.   Medication Instructions:  Your physician recommends that you continue on your current medications as directed. Please refer to the Current Medication list given to you today.  If you need a refill on your cardiac medications before your next appointment, please call your pharmacy.   Lab work: none If you have labs (blood work) drawn today and your tests are completely normal, you will receive your results only by: Marland Kitchen MyChart Message (if you have MyChart) OR . A paper copy in the mail If you have any lab test that is abnormal or we need to change your treatment, we will call you to review the results.  Testing/Procedures: none  Follow-Up: At [facility], you and your health needs are our priority.  As part of our continuing mission to provide you with exceptional heart care, we have created designated Provider Care Teams.  These Care Teams include your primary Cardiologist (physician) and Advanced Practice Providers (APPs -  Physician Assistants and Nurse Practitioners) who all work together to provide you with the care you need, when you need it. You will need a follow up appointment in 6 weeks in office in the morning (patient will need fasting labs that morning) with Dr End or APP.    Please call our office 2 months in advance to schedule this appointment.  You may see Yvonne Kendall, MD or one of the following Advanced Practice Providers on your designated Care Team:   Nicolasa Ducking, NP Eula Listen, PA-C . Marisue Ivan, PA-C

## 2019-02-26 ENCOUNTER — Other Ambulatory Visit: Payer: Self-pay

## 2019-02-26 ENCOUNTER — Ambulatory Visit (INDEPENDENT_AMBULATORY_CARE_PROVIDER_SITE_OTHER): Payer: Medicare Other

## 2019-02-26 DIAGNOSIS — Z5181 Encounter for therapeutic drug level monitoring: Secondary | ICD-10-CM | POA: Diagnosis not present

## 2019-02-26 LAB — POCT INR: INR: 2.9 (ref 2.0–3.0)

## 2019-02-26 NOTE — Patient Instructions (Signed)
Description   Spoke with pt and dtr [name] (on dpr) & instructed pt to continue taking 1 tablet (5mg ) daily. Recheck in 2 weeks in the office. Continue eating 2 servings of green leafy vegetables each week. Call Coumadin clinic with any questions new medications or if scheduled for any procedures [phone].

## 2019-03-05 ENCOUNTER — Telehealth: Payer: Self-pay

## 2019-03-05 NOTE — Telephone Encounter (Signed)

## 2019-03-13 ENCOUNTER — Telehealth: Payer: Self-pay | Admitting: *Deleted

## 2019-03-13 NOTE — Telephone Encounter (Signed)
1. COVID-19 Pre-Screening Questions:  . In the past 7 to 10 days have you had a cough,  shortness of breath, headache, congestion, fever (100 or greater) body aches, chills, sore throat, or sudden loss of taste or sense of smell? No . Have you been around anyone with known Covid 19. No . Have you been around anyone who is awaiting Covid 19 test results in the past 7 to 10 days? No . Have you been around anyone who has been exposed to Covid 19, or has mentioned symptoms of Covid 19 within the past 7 to 10 days?  No    2. Pt advised of visitor restrictions (no visitors allowed except if needed to conduct the visit). Also advised to arrive at appointment time and wear a mask.   Daughter Caren Griffins called back and needed to know appt time and date. Also, she stated the pt is sick but not with respiratory symptoms but tired and sleeping a lot as she does sometimes. Rescreened pt with the dtr over the phone and confirmed appt-[facility].

## 2019-03-23 ENCOUNTER — Ambulatory Visit (INDEPENDENT_AMBULATORY_CARE_PROVIDER_SITE_OTHER): Payer: Medicare Other

## 2019-03-23 ENCOUNTER — Other Ambulatory Visit: Payer: Self-pay

## 2019-03-23 DIAGNOSIS — Z5181 Encounter for therapeutic drug level monitoring: Secondary | ICD-10-CM | POA: Diagnosis not present

## 2019-03-23 LAB — POCT INR: INR: 1.6 — AB (ref 2.0–3.0)

## 2019-03-23 MED ORDER — WARFARIN SODIUM 5 MG PO TABS: ORAL_TABLET | ORAL | 0 refills | Status: DC

## 2019-03-23 NOTE — Patient Instructions (Signed)
Description   Take 1.5 tablets today and tomorrow, then resume same dosage 1 tablet (5mg ) daily. Recheck in 2 weeks in the office. Continue eating 2 servings of green leafy vegetables each week. Call Coumadin clinic with any questions new medications or if scheduled for any procedures [phone].

## 2019-03-24 ENCOUNTER — Encounter (HOSPITAL_COMMUNITY): Payer: Self-pay

## 2019-03-24 ENCOUNTER — Other Ambulatory Visit: Payer: Self-pay | Admitting: Internal Medicine

## 2019-03-24 ENCOUNTER — Ambulatory Visit (INDEPENDENT_AMBULATORY_CARE_PROVIDER_SITE_OTHER): Payer: Medicare Other

## 2019-03-24 ENCOUNTER — Ambulatory Visit (HOSPITAL_COMMUNITY)
Admission: EM | Admit: 2019-03-24 | Discharge: 2019-03-24 | Disposition: A | Discharge status: Home / Self Care | Payer: Medicare Other | Attending: Family Medicine | Admitting: Family Medicine

## 2019-03-24 ENCOUNTER — Other Ambulatory Visit: Payer: Self-pay

## 2019-03-24 DIAGNOSIS — Z20822 Contact with and (suspected) exposure to covid-19: Secondary | ICD-10-CM

## 2019-03-24 DIAGNOSIS — R05 Cough: Secondary | ICD-10-CM

## 2019-03-24 DIAGNOSIS — R059 Cough, unspecified: Secondary | ICD-10-CM

## 2019-03-24 NOTE — ED Provider Notes (Signed)
[facility]    CSN: [id number] Arrival date & time: 03/24/19  1644     History   Chief Complaint Chief Complaint  Patient presents with  . Cough    HPI Erin Roberson is a 85 y.o. female.   HPI  Patient is here to be seen for a cough.  She states she is been having cough for 3 to 4 weeks.  The cough is intermittent.  When she does cough she coughs up some white sputum.  "Sometimes a tablespoon".  No blood.  No shortness of breath.  No chest pain.  No fatigue.  No fever.  No change in appetite.  No nausea or vomiting.  Normal bowel movements.  No runny or stuffy nose.  No postnasal drip.  No sore throat.  Denies exposure to COVID-19.  States she has been staying home except for medical visits and careful to wear a mask when she goes out.  Interestingly she was seen yesterday in the Coumadin clinic for a fingerstick INR.  She received a call the day before with COVID screening questions.  She told the screener that she did not have a cough.  She is here today because her daughter is here visiting.  The daughter heard her coughing.  She became upset and brought her here with a note requesting a chest x-ray.  She she has no underlying lung disease, COPD, or asthma by history.  Old x-rays do show some hyperinflation.  Past Medical History:  Diagnosis Date  . Arthritis   . Breast mass    left breast  . Clotting disorder (HCC)   . Coronary atherosclerosis    a. s/p PCTA LAD (1992). b. Last cath in 10/11 with nonobstructive disease, EF 60% by LV-gram.  . Dementia (HCC)   . Depression   . Diverticulosis of colon (without mention of hemorrhage)   . Fibromyalgia   . Glaucoma   . Hemorrhage of rectum and anus   . History of uterine cancer   . Iron deficiency anemia, unspecified   . Leukopenia    chronic--benign  . Other pulmonary embolism and infarction    2011  . Pure hypercholesterolemia   . Thyroid disease   . UTI (lower urinary tract infection)     Patient  Active Problem List   Diagnosis Date Noted  . Lightheadedness 02/25/2019  . Cough 02/25/2019  . Coronary artery disease involving native coronary artery of native heart without angina pectoris 09/05/2018  . Hyperlipidemia LDL goal <70 09/05/2018  . Near syncope 11/10/2016  . Bradycardia 11/10/2016  . Chronic anticoagulation - Coumadin for history of PE 11/08/2015  . Encounter for therapeutic drug monitoring 10/30/2013  . Chronic LLQ pain 12/13/2011  . Dementia (HCC) 12/13/2011  . Breast lesion, Left, 12 o'clock. 10/28/2011  . Constipation, chronic 08/09/2011  . Diverticulosis of colon 08/09/2011  . Long term current use of anticoagulant 11/18/2010  . PULMONARY EMBOLISM 08/15/2010  . HEMATURIA, HX OF 08/15/2010  . Dizziness 08/30/2009  . CHEST PAIN, ATYPICAL 08/30/2009  . UNSTEADY GAIT 08/17/2009  . HYPERCHOLESTEROLEMIA 12/18/2007  . LEUKOPENIA, MILD 12/18/2007  . DEPRESSION 12/18/2007  . Coronary atherosclerosis 12/18/2007    Past Surgical History:  Procedure Laterality Date  . ABDOMINAL HYSTERECTOMY    . CARDIAC CATHETERIZATION  07/25/10   luminal irregularities in multiple vessels, no sig stenosis, EF 60%  . CATARACT EXTRACTION  2004   right  . COLONOSCOPY  07/01/2007   diverticulosis  . CORONARY ANGIOPLASTY  1992  LAD  . KIDNEY SURGERY     stent placed  . NECK SURGERY    . thyroid needle aspiration  12/2007   right, hyperplastic nodule  . UPPER GASTROINTESTINAL ENDOSCOPY  07/16/2007   tortous esophagus with spasms    OB History   No obstetric history on file.      Home Medications    Prior to Admission medications   Medication Sig Start Date End Date Taking? Authorizing Provider  atorvastatin (LIPITOR) 80 MG tablet Take 1 tablet (80 mg total) by mouth daily. 08/21/18 02/25/19  End, Harrell Gave, MD  NAMZARIC 7-10 MG CP24 Take 1 capsule by mouth daily. 10/06/16   [provider]  warfarin (COUMADIN) 5 MG tablet TAKE AS DIRECTED BY COUMADIN CLINIC  03/23/19   End, Harrell Gave, MD    Family History Family History  Problem Relation Age of Onset  . Diabetes Mother        deceased age 68  . Hypertension Mother   . Pancreatic cancer Father        deceased age 63  . Emphysema Brother        deceased  . Alcohol abuse Brother   . Osteoporosis Other   . Stroke Maternal Aunt   . Colon cancer Neg Hx   . Heart attack Neg Hx     Social History Social History   Tobacco Use  . Smoking status: Never Smoker  . Smokeless tobacco: Never Used  Substance Use Topics  . Alcohol use: No  . Drug use: No     Allergies   Patient has no known allergies.   Review of Systems Review of Systems  Constitutional: Negative for chills and fever.  HENT: Negative for ear pain and sore throat.   Eyes: Negative for pain and visual disturbance.  Respiratory: Positive for cough. Negative for shortness of breath.   Cardiovascular: Negative for chest pain and palpitations.  Gastrointestinal: Negative for abdominal pain and vomiting.  Genitourinary: Negative for dysuria and hematuria.  Musculoskeletal: Negative for arthralgias and back pain.  Skin: Negative for color change and rash.  Neurological: Negative for seizures and syncope.  Psychiatric/Behavioral: Positive for decreased concentration.  All other systems reviewed and are negative.    Physical Exam Triage Vital Signs ED Triage Vitals  Enc Vitals Group     BP 03/24/19 1708 (!) 109/48     Pulse Rate 03/24/19 1708 79     Resp 03/24/19 1708 17     Temp 03/24/19 1708 98 F (36.7 C)     Temp Source 03/24/19 1708 Oral     SpO2 03/24/19 1708 100 %     Weight --      Height --      Head Circumference --      Peak Flow --      Pain Score 03/24/19 1706 0     Pain Loc --      Pain Edu? --      Excl. in [place]? --    No data found.  Updated Vital Signs BP (!) 109/48 (BP Location: Right Arm)   Pulse 79   Temp 98 F (36.7 C) (Oral)   Resp 17   SpO2 100%       Physical  Exam Constitutional:      General: She is not in acute distress.    Appearance: Normal appearance. She is well-developed.     Comments: Appears stated age.  Pleasant and cooperative.  Mildly.  Memory/cognition.  Has a note  from her daughter  HENT:     Head: Normocephalic and atraumatic.     Right Ear: Tympanic membrane and ear canal normal.     Left Ear: Tympanic membrane and ear canal normal.     Nose: Nose normal.     Mouth/Throat:     Mouth: Mucous membranes are moist.     Pharynx: No posterior oropharyngeal erythema.  Eyes:     Conjunctiva/sclera: Conjunctivae normal.     Pupils: Pupils are equal, round, and reactive to light.  Neck:     Musculoskeletal: Normal range of motion and neck supple.  Cardiovascular:     Rate and Rhythm: Normal rate and regular rhythm.     Heart sounds: Normal heart sounds.  Pulmonary:     Effort: Pulmonary effort is normal. No respiratory distress.     Breath sounds: Normal breath sounds.     Comments: Lungs are clear Abdominal:     General: There is no distension.     Palpations: Abdomen is soft.  Musculoskeletal: Normal range of motion.  Skin:    General: Skin is warm and dry.  Neurological:     Mental Status: She is alert. Mental status is at baseline.  Psychiatric:        Behavior: Behavior normal.      UC Treatments / Results  Labs (all labs ordered are listed, but only abnormal results are displayed) Labs Reviewed - No data to display  EKG None  Radiology Dg Chest 2 View  Result Date: 03/24/2019 CLINICAL DATA:  Productive cough for 4 weeks. EXAM: CHEST - 2 VIEW COMPARISON:  Chest x-ray 12/21/2018 FINDINGS: The cardiac silhouette, mediastinal and hilar contours are normal and stable. There is mild tortuosity and calcification of the thoracic aorta. The lungs are clear of an acute process. No infiltrates, edema or effusions. No worrisome pulmonary lesions. The bony thorax is intact. Moderate scoliosis. IMPRESSION: No acute  cardiopulmonary findings. Electronically Signed   By: Rudie MeyerP.  Gallerani M.D.   On: 03/24/2019 18:13    Procedures Procedures (including critical care time)  Medications Ordered in UC Medications - No data to display  Initial Impression / Assessment and Plan / UC Course  I have reviewed the triage vital signs and the nursing notes.  Pertinent labs & imaging results that were available during my care of the patient were reviewed by me and considered in my medical decision making (see chart for details).     *Results are discussed with patient.  Instructions are given in writing. Final Clinical Impressions(s) / UC Diagnoses   Final diagnoses:  Cough     Discharge Instructions     X-ray is normal No evidence of infection You have some bronchial irritation, likely from a virus No evidence of coronavirus (COVID-19) infection Continue treatment at home.  Drink lots of fluids.  Take Robitussin or Delsym for cough, if needed Call your doctor if not better in a week Go to ER if worse instead of better at any time, increasing cough, fever, weakness, or shortness of breath    ED Prescriptions    None     Controlled Substance Prescriptions [place] Controlled Substance Registry consulted? Not Applicable   Eustace MooreNelson, Onell Mcmath Sue, MD 03/24/19 1827

## 2019-03-24 NOTE — ED Triage Notes (Signed)
Patient presents to Urgent Care with complaints of cough, ongoing since 2 weeks ago. Patient reports the cough is producing white phlegm, pt denies fever or SOB.

## 2019-03-24 NOTE — Discharge Instructions (Addendum)
X-ray is normal No evidence of infection You have some bronchial irritation, likely from a virus No evidence of coronavirus (COVID-19) infection Continue treatment at home.  Drink lots of fluids.  Take Robitussin or Delsym for cough, if needed Call your doctor if not better in a week Go to ER if worse instead of better at any time, increasing cough, fever, weakness, or shortness of breath

## 2019-03-27 LAB — NOVEL CORONAVIRUS, NAA: SARS-CoV-2, NAA: NOT DETECTED

## 2019-03-30 ENCOUNTER — Telehealth: Payer: Self-pay

## 2019-03-30 NOTE — Telephone Encounter (Signed)
Unable to lmom for prescreen vmbox full 

## 2019-04-01 ENCOUNTER — Telehealth: Payer: Medicare Other | Admitting: Internal Medicine

## 2019-04-06 ENCOUNTER — Ambulatory Visit (INDEPENDENT_AMBULATORY_CARE_PROVIDER_SITE_OTHER): Payer: Medicare Other | Admitting: *Deleted

## 2019-04-06 ENCOUNTER — Other Ambulatory Visit: Payer: Self-pay

## 2019-04-06 DIAGNOSIS — Z5181 Encounter for therapeutic drug level monitoring: Secondary | ICD-10-CM

## 2019-04-06 LAB — POCT INR: INR: 1.1 — AB (ref 2.0–3.0)

## 2019-04-06 NOTE — Patient Instructions (Addendum)
Description   Take 1.5 tablets today and tomorrow, then resume same dosage 1 tablet (5mg ) daily. Recheck in 1 week. Continue eating 2 servings of green leafy vegetables each week. Call Coumadin clinic with any questions new medications or if scheduled for any procedures [phone].

## 2019-04-06 NOTE — Telephone Encounter (Signed)

## 2019-04-07 ENCOUNTER — Ambulatory Visit: Payer: Medicare Other | Admitting: Physician Assistant

## 2019-04-10 ENCOUNTER — Telehealth: Payer: Self-pay | Admitting: Internal Medicine

## 2019-04-10 NOTE — Telephone Encounter (Signed)
No Message Needed  °

## 2019-04-13 NOTE — Progress Notes (Deleted)
Cardiology Office Note Date:  04/13/2019  Patient ID:  Erin Roberson 1924/05/16, MRN [id number] PCP:  Vincente Liberty, MD  Cardiologist:  Dr. Saunders Revel, MD  ***refresh   Chief Complaint: ***  History of Present Illness: Erin Roberson is a 85 y.o. female with history of ***   Past Medical History:  Diagnosis Date  . Arthritis   . Breast mass    left breast  . Clotting disorder ([place])   . Coronary atherosclerosis    a. s/p PCTA LAD (1992). b. Last cath in 10/11 with nonobstructive disease, EF 60% by LV-gram.  . Dementia ([place])   . Depression   . Diverticulosis of colon (without mention of hemorrhage)   . Fibromyalgia   . Glaucoma   . Hemorrhage of rectum and anus   . History of uterine cancer   . Iron deficiency anemia, unspecified   . Leukopenia    chronic--benign  . Other pulmonary embolism and infarction    2011  . Pure hypercholesterolemia   . Thyroid disease   . UTI (lower urinary tract infection)     Past Surgical History:  Procedure Laterality Date  . ABDOMINAL HYSTERECTOMY    . CARDIAC CATHETERIZATION  07/25/10   luminal irregularities in multiple vessels, no sig stenosis, EF 60%  . CATARACT EXTRACTION  2004   right  . COLONOSCOPY  07/01/2007   diverticulosis  . CORONARY ANGIOPLASTY  1992   LAD  . KIDNEY SURGERY     stent placed  . NECK SURGERY    . thyroid needle aspiration  12/2007   right, hyperplastic nodule  . UPPER GASTROINTESTINAL ENDOSCOPY  07/16/2007   tortous esophagus with spasms    No outpatient medications have been marked as taking for the 04/15/19 encounter (Appointment) with Rise Mu, PA-C.    Allergies:   Patient has no known allergies.   Social History:  The patient  reports that she has never smoked. She has never used smokeless tobacco. She reports that she does not drink alcohol or use drugs.   Family History:  The patient's family history includes Alcohol abuse in her brother; Diabetes in her mother; Emphysema in her  brother; Hypertension in her mother; Osteoporosis in an other family member; Pancreatic cancer in her father; Stroke in her maternal aunt.  ROS:   ROS   PHYSICAL EXAM: *** VS:  There were no vitals taken for this visit. BMI: There is no height or weight on file to calculate BMI.  Physical Exam   EKG:  Was ordered and interpreted by me today. Shows ***  Recent Labs: 10/30/2018: ALT 9 12/21/2018: BUN 20; Creatinine, Ser 1.18; Hemoglobin 12.4; Platelets 182; Potassium 3.9; Sodium 142  05/15/2018: LDL Direct 177 10/30/2018: Chol/HDL Ratio 3.4; Cholesterol, Total 202; HDL 60; LDL Calculated 117; Triglycerides 127   CrCl cannot be calculated (Patient's most recent lab result is older than the maximum 21 days allowed.).   Wt Readings from Last 3 Encounters:  02/25/19 130 lb (59 kg)  12/21/18 135 lb (61.2 kg)  09/04/18 136 lb 9.6 oz (62 kg)     Other studies reviewed: Additional studies/records reviewed today include: summarized above  ASSESSMENT AND PLAN:  1. ***  Disposition: F/u with Dr. Saunders Revel or an APP in ***.  Current medicines are reviewed at length with the patient today.  The patient did not have any concerns regarding medicines.  Signed, Christell Faith, PA-C 04/13/2019 2:50 PM     [place]  [place] [place] [id number] [phone]

## 2019-04-15 ENCOUNTER — Ambulatory Visit: Payer: Medicare Other | Admitting: Physician Assistant

## 2019-05-11 ENCOUNTER — Other Ambulatory Visit: Payer: Self-pay

## 2019-05-11 ENCOUNTER — Ambulatory Visit (INDEPENDENT_AMBULATORY_CARE_PROVIDER_SITE_OTHER): Payer: Medicare Other

## 2019-05-11 DIAGNOSIS — Z5181 Encounter for therapeutic drug level monitoring: Secondary | ICD-10-CM | POA: Diagnosis not present

## 2019-05-11 LAB — POCT INR: INR: 1.1 — AB (ref 2.0–3.0)

## 2019-05-11 NOTE — Patient Instructions (Signed)
Description   Take 1.5 tablets of Warfarin today and tomorrow, then resume same dosage 1 tablet (5mg ) of Warfarin daily. Recheck in 1 week. Continue eating 2 servings of green leafy vegetables each week. Call Coumadin clinic with any questions new medications or if scheduled for any procedures [phone].

## 2019-05-12 ENCOUNTER — Telehealth: Payer: Self-pay | Admitting: Pharmacist

## 2019-05-12 NOTE — Telephone Encounter (Signed)
Daughter called coumadin clinic and left message on machine. Stated "she would call us back later, no need for [place] to call her back"

## 2019-05-12 NOTE — Telephone Encounter (Signed)
Left message for daughter to discuss stopping warfarin and starting aspirin 81mg  daily.

## 2019-05-12 NOTE — Telephone Encounter (Signed)
-----   Message from Nelva Bush, MD sent at 05/12/2019  9:30 AM EDT ----- Regarding: RE: Long term anticoag? Hi Megan,Thanks for your message.  You make several good points about Ms. Lauderbaugh, and I agree that at this point it is probably excessively risky and inconvenient to keep her on anticoagulation.  It looks like she was scheduled to follow-up with Christell Faith, PA, in our office but canceled her appointment.  If you can reach out to her to stop warfarin and start aspirin 81 mg daily with her history of CAD, I will have our office try to get her in to see me or Thurmond Butts in the next week or two as well.  Thanks again.Gerald Stabs ----- Message ----- From: Leeroy Bock, [facility] Sent: 05/12/2019   8:56 AM EDT To: Nelva Bush, MD Subject: Long term anticoag?                            Hi Dr End,  We follow Ms Wainwright' warfarin which she takes for hx of unprovoked PE. She last had a telehealth visit with you in May and you mentioned "Given her age and lightheadedness, I am concerned about the risks for bleeding with anticoagulation, though we will plan to continue this for now and readdress risks and benefits of long-term anticoagulation at follow-up." Her INR has been consistently subtherapeutic due to her dementia and forgetting to take her medication (of 11 INRs checked this year, 7 were essentially 1 and 3 were therapeutic). We previously discussed DOACs with her daughter, however she declined (and I am unsure that this would provide much additional benefit since the issue seems to be with remembering to take her medication in the first place). What are your thoughts on stopping warfarin? With INRs of 1, we follow up in clinic in 1 week and it seems tough to rationalize bringing in a 85 year old patient in each week for an INR check especially during COVID. Any thoughts?  Thanks, [employer]

## 2019-05-13 ENCOUNTER — Telehealth: Payer: Self-pay | Admitting: Internal Medicine

## 2019-05-13 NOTE — Telephone Encounter (Signed)
-----   Message from Nelva Bush, MD sent at 05/12/2019  9:34 AM EDT ----- Regarding: FW: Long term anticoag? Good morning,  Could you reach out to the patient to see if we can arrange for her to see me or an APP in the next week or two to reassess her dizziness and to discuss anticoagulation?  If she prefers, we can do this as a virtual visit.  Thanks.  Gerald Stabs ----- Message ----- From: Leeroy Bock, [facility] Sent: 05/12/2019   8:56 AM EDT To: Nelva Bush, MD Subject: Long term anticoag?                            Hi Dr End,  We follow Ms Eberwein' warfarin which she takes for hx of unprovoked PE. She last had a telehealth visit with you in May and you mentioned "Given her age and lightheadedness, I am concerned about the risks for bleeding with anticoagulation, though we will plan to continue this for now and readdress risks and benefits of long-term anticoagulation at follow-up." Her INR has been consistently subtherapeutic due to her dementia and forgetting to take her medication (of 11 INRs checked this year, 7 were essentially 1 and 3 were therapeutic). We previously discussed DOACs with her daughter, however she declined (and I am unsure that this would provide much additional benefit since the issue seems to be with remembering to take her medication in the first place). What are your thoughts on stopping warfarin? With INRs of 1, we follow up in clinic in 1 week and it seems tough to rationalize bringing in a 85 year old patient in each week for an INR check especially during COVID. Any thoughts?  Thanks, [employer]

## 2019-05-13 NOTE — Telephone Encounter (Signed)
Called and left voicemail for patient to call back and schedule.

## 2019-05-14 NOTE — Telephone Encounter (Signed)
Left another message for patient's daughter, Caren Griffins. Pt has a Coumadin appt still scheduled for next Monday and can cancel this if we are able to contact pt/daughter before then.

## 2019-05-15 NOTE — Telephone Encounter (Signed)
Schedulers, can you please contact the patient's daughter for a follow up with Dr. Saunders Revel or an APP? Thanks.

## 2019-05-15 NOTE — Telephone Encounter (Signed)
Spoke with patients daughter. Provided advice from Dr. Saunders Revel that we stop coumadin and start a baby aspirin. She was in agreement. Also advised that Dr. Saunders Revel or Thurmond Butts would like to see her in the office in a few weeks.  I stated that someone from their office would reach out to her about making an appointment, but if she didn't hear from them to give the office a call.

## 2019-05-18 NOTE — Telephone Encounter (Signed)
Patients daughter, Caren Griffins, states she will callback to schedule when she knows her mothers availability

## 2019-06-11 NOTE — Telephone Encounter (Signed)
Does patient still need an appointment with APP?

## 2019-06-11 NOTE — Telephone Encounter (Signed)
She declined 

## 2019-06-11 NOTE — Telephone Encounter (Signed)
Declined to schedule at this time.  Will call if she wants to be seen .

## 2019-06-11 NOTE — Telephone Encounter (Signed)
Routing to Dr End to make him aware patient declined appointment.

## 2019-06-11 NOTE — Telephone Encounter (Signed)
fyi  Closing encounter

## 2019-06-11 NOTE — Telephone Encounter (Signed)
You Just now (11:52 AM)     Declined to schedule at this time.  Will call if she wants to be seen .       Documentation     Bumgarner, Lovena Le R routed conversation to [employer] 4 weeks ago    Tax adviser, Lovena Le R 4 weeks ago     Called and left voicemail for patient to call back and schedule.      Documentation     Bumgarner, Valentino Nose N 4 weeks ago    [employer], Lovena Le R 4 weeks ago     ----- Message from Nelva Bush, MD sent at 05/12/2019  9:34 AM EDT ----- Regarding: FW: Long term anticoag? Good morning,  Could you reach out to the patient to see if we can arrange for her to see me or an APP in the next week or two to reassess her dizziness and to discuss anticoagulation?  If she prefers, we can do this as a virtual visit.  Thanks.  Gerald Stabs ----- Message ----- From: Leeroy Bock, [facility] Sent: 05/12/2019   8:56 AM EDT To: Nelva Bush, MD Subject: Long term anticoag?                            Hi Dr End,  We follow Ms Zappia' warfarin which she takes for hx of unprovoked PE. She last had a telehealth visit with you in May and you mentioned "Given her age and lightheadedness, I am concerned about the risks for bleeding with anticoagulation, though we will plan to continue this for now and readdress risks and benefits of long-term anticoagulation at follow-up." Her INR has been consistently subtherapeutic due to her dementia and forgetting to take her medication (of 11 INRs checked this year, 7 were essentially 1 and 3 were therapeutic). We previously discussed DOACs with her daughter, however she declined (and I am unsure that this would provide much additional benefit since the issue seems to be with remembering to take her medication in the first place). What are your thoughts on stopping warfarin? With INRs of 1, we follow up in clinic in 1 week and it seems tough to rationalize bringing in a 85 year old patient in each week for an INR check  especially during COVID. Any thoughts?  Thanks, [employer]

## 2019-06-12 NOTE — Telephone Encounter (Signed)
Thank you for the update.  If it is more convenient for Ms. Delangel to see a provider in [place] in the future, we can always make arrangements for her to be seen by an MD/APP at the [address] office.  Nelva Bush, MD [facility] Pager: [phone]

## 2019-06-20 ENCOUNTER — Other Ambulatory Visit: Payer: Self-pay | Admitting: Cardiology

## 2019-06-22 NOTE — Telephone Encounter (Signed)
Called and lmomed the pt regarding overdue inr hold until appt is scheduled

## 2019-06-22 NOTE — Telephone Encounter (Signed)
Pt is no longer on Warfarin, per telephone note in Epic 05/12/19 Dr End discontinued Warfarin and started pt on 81mg  ASA.

## 2019-10-12 ENCOUNTER — Other Ambulatory Visit: Payer: Self-pay

## 2019-10-12 ENCOUNTER — Telehealth: Payer: Self-pay

## 2019-10-12 MED ORDER — ATORVASTATIN CALCIUM 80 MG PO TABS: 80.0000 mg | ORAL_TABLET | Freq: Every day | ORAL | 0 refills | Status: DC

## 2019-10-12 NOTE — Telephone Encounter (Signed)
Left voicemail message requesting to have patient call back to schedule overdue F/U appointment with Dr. Okey Dupre.

## 2019-11-04 ENCOUNTER — Other Ambulatory Visit: Payer: Self-pay | Admitting: Internal Medicine

## 2019-11-30 ENCOUNTER — Ambulatory Visit: Payer: Medicare HMO

## 2019-12-12 ENCOUNTER — Ambulatory Visit: Payer: Medicare HMO | Attending: Internal Medicine

## 2019-12-12 DIAGNOSIS — Z23 Encounter for immunization: Secondary | ICD-10-CM

## 2019-12-12 NOTE — Progress Notes (Signed)
   Covid-19 Vaccination Clinic  Name:  Erin Roberson    MRN: [id number] DOB: Jun 22, 1924  12/12/2019  [name] was observed post Covid-19 immunization for 15 minutes without incident. She was provided with Vaccine Information Sheet and instruction to access the V-Safe system.   [name] was instructed to call 911 with any severe reactions post vaccine: Marland Kitchen Difficulty breathing  . Swelling of face and throat  . A fast heartbeat  . A bad rash all over body  . Dizziness and weakness   Immunizations Administered    Name Date Dose VIS Date Route   Pfizer COVID-19 Vaccine 12/12/2019  3:50 PM 0.3 mL 09/11/2019 Intramuscular   Manufacturer: [employer], [employer]   Lot: [id number]   NDC: [address]-9

## 2019-12-21 ENCOUNTER — Ambulatory Visit: Payer: Medicare HMO

## 2020-01-06 ENCOUNTER — Ambulatory Visit: Payer: Medicare HMO | Attending: Internal Medicine

## 2020-01-06 DIAGNOSIS — Z23 Encounter for immunization: Secondary | ICD-10-CM

## 2020-01-06 NOTE — Progress Notes (Signed)
   Covid-19 Vaccination Clinic  Name:  Erin Roberson    MRN: [id number] DOB: [birth year]  01/06/2020  Erin Roberson was observed post Covid-19 immunization for 15 minutes without incident. She was provided with Vaccine Information Sheet and instruction to access the V-Safe system.   Erin Roberson was instructed to call 911 with any severe reactions post vaccine: Marland Kitchen Difficulty breathing  . Swelling of face and throat  . A fast heartbeat  . A bad rash all over body  . Dizziness and weakness   Immunizations Administered    Name Date Dose VIS Date Route   Pfizer COVID-19 Vaccine 01/06/2020  4:06 PM 0.3 mL 09/11/2019 Intramuscular   Manufacturer: [employer], [employer]   Lot: [id number]   NDC: [address]-6

## 2020-05-24 ENCOUNTER — Other Ambulatory Visit: Payer: Self-pay | Admitting: Internal Medicine

## 2020-08-05 NOTE — Telephone Encounter (Signed)
Closing encounter

## 2020-10-07 ENCOUNTER — Other Ambulatory Visit: Payer: Self-pay | Admitting: Internal Medicine

## 2020-10-07 NOTE — Telephone Encounter (Signed)
LVM for pt to call back.

## 2020-10-07 NOTE — Telephone Encounter (Signed)
Please schedule overdue office visit. Thank you! 

## 2020-10-19 NOTE — Telephone Encounter (Signed)
Patient was called and said she does not want an appointment in [place], only [place]

## 2020-10-26 ENCOUNTER — Ambulatory Visit
Admission: RE | Admit: 2020-10-26 | Discharge: 2020-10-26 | Disposition: A | Discharge status: Home / Self Care | Payer: Medicare HMO | Source: Ambulatory Visit | Attending: Pulmonary Disease | Admitting: Pulmonary Disease

## 2020-10-26 ENCOUNTER — Other Ambulatory Visit: Payer: Self-pay | Admitting: Pulmonary Disease

## 2020-10-26 DIAGNOSIS — R059 Cough, unspecified: Secondary | ICD-10-CM

## 2020-11-13 ENCOUNTER — Other Ambulatory Visit: Payer: Self-pay | Admitting: Internal Medicine

## 2020-11-15 ENCOUNTER — Other Ambulatory Visit: Payer: Self-pay | Admitting: Internal Medicine

## 2020-11-30 ENCOUNTER — Ambulatory Visit: Payer: Medicare (Managed Care) | Admitting: Internal Medicine

## 2021-01-26 ENCOUNTER — Other Ambulatory Visit: Payer: Self-pay | Admitting: Pulmonary Disease

## 2021-01-26 ENCOUNTER — Other Ambulatory Visit (HOSPITAL_COMMUNITY): Payer: Self-pay | Admitting: Pulmonary Disease

## 2021-01-26 DIAGNOSIS — K229 Disease of esophagus, unspecified: Secondary | ICD-10-CM

## 2021-01-26 DIAGNOSIS — K219 Gastro-esophageal reflux disease without esophagitis: Secondary | ICD-10-CM

## 2021-02-09 ENCOUNTER — Ambulatory Visit (HOSPITAL_COMMUNITY): Payer: Medicare (Managed Care)

## 2021-02-23 ENCOUNTER — Other Ambulatory Visit: Payer: Self-pay

## 2021-02-23 ENCOUNTER — Ambulatory Visit (HOSPITAL_COMMUNITY)
Admission: RE | Admit: 2021-02-23 | Discharge: 2021-02-23 | Disposition: A | Discharge status: Home / Self Care | Payer: Medicare HMO | Source: Ambulatory Visit | Attending: Pulmonary Disease | Admitting: Pulmonary Disease

## 2021-02-23 DIAGNOSIS — K229 Disease of esophagus, unspecified: Secondary | ICD-10-CM | POA: Diagnosis not present

## 2021-02-23 DIAGNOSIS — K224 Dyskinesia of esophagus: Secondary | ICD-10-CM | POA: Diagnosis not present

## 2021-02-23 DIAGNOSIS — K219 Gastro-esophageal reflux disease without esophagitis: Secondary | ICD-10-CM | POA: Insufficient documentation

## 2021-05-18 DIAGNOSIS — Z79899 Other long term (current) drug therapy: Secondary | ICD-10-CM | POA: Diagnosis not present

## 2021-05-18 DIAGNOSIS — E7800 Pure hypercholesterolemia, unspecified: Secondary | ICD-10-CM | POA: Diagnosis not present

## 2021-05-18 DIAGNOSIS — R7302 Impaired glucose tolerance (oral): Secondary | ICD-10-CM | POA: Diagnosis not present

## 2021-05-18 DIAGNOSIS — E559 Vitamin D deficiency, unspecified: Secondary | ICD-10-CM | POA: Diagnosis not present

## 2021-08-01 DIAGNOSIS — E559 Vitamin D deficiency, unspecified: Secondary | ICD-10-CM | POA: Diagnosis not present

## 2021-08-01 DIAGNOSIS — R7302 Impaired glucose tolerance (oral): Secondary | ICD-10-CM | POA: Diagnosis not present

## 2021-08-01 DIAGNOSIS — R42 Dizziness and giddiness: Secondary | ICD-10-CM | POA: Diagnosis not present

## 2021-08-01 DIAGNOSIS — J309 Allergic rhinitis, unspecified: Secondary | ICD-10-CM | POA: Diagnosis not present

## 2021-08-01 DIAGNOSIS — I2510 Atherosclerotic heart disease of native coronary artery without angina pectoris: Secondary | ICD-10-CM | POA: Diagnosis not present

## 2021-08-01 DIAGNOSIS — I951 Orthostatic hypotension: Secondary | ICD-10-CM | POA: Diagnosis not present

## 2021-08-01 DIAGNOSIS — E7800 Pure hypercholesterolemia, unspecified: Secondary | ICD-10-CM | POA: Diagnosis not present

## 2021-08-01 DIAGNOSIS — M2550 Pain in unspecified joint: Secondary | ICD-10-CM | POA: Diagnosis not present

## 2021-08-01 DIAGNOSIS — H9190 Unspecified hearing loss, unspecified ear: Secondary | ICD-10-CM | POA: Diagnosis not present

## 2021-11-07 ENCOUNTER — Telehealth: Payer: Self-pay | Admitting: Internal Medicine

## 2021-11-07 ENCOUNTER — Ambulatory Visit: Payer: Medicare PPO | Admitting: Nurse Practitioner

## 2021-11-07 NOTE — Telephone Encounter (Signed)
Called patient daughter back and left a VM encouraging her to call us back.

## 2021-11-07 NOTE — Telephone Encounter (Signed)
Pt c/o BP issue: STAT if pt c/o blurred vision, one-sided weakness or slurred speech  1. What are your last 5 BP readings? Unsure daughter to call back with readings but notes diastolic is [id number]   2. Are you having any other symptoms (ex. Dizziness, headache, blurred vision, passed out)? Fatigue tired sleeps all day   3. What is your BP issue? Cancelled close fu today doesn't feel like getting dressed. Attempted to keep as virtual with daughter assisting but she states patient declined this .   Daughter wants asap reschedule anywhere . Added to waitlist

## 2021-11-08 NOTE — Telephone Encounter (Signed)
2nd attempt to contact the patients daughter. Lmtcb.

## 2021-11-09 NOTE — Telephone Encounter (Signed)
3rd and final attempt to call pt's daughter [name].  No answer. Lmtcb.

## 2021-11-14 ENCOUNTER — Telehealth: Payer: Self-pay

## 2021-11-14 NOTE — Telephone Encounter (Signed)
Patients daughter calling making [place] aware that she will be faxing over a sheet of patients BP reading.

## 2021-11-15 NOTE — Telephone Encounter (Signed)
Noted. Will look out for BP log.

## 2021-11-15 NOTE — Telephone Encounter (Signed)
Patient daughter faxed BP readings 10/21/21 114/54 7:35pm 10/22/21 100/61 12:50am 11/10/21 70/52 1:02pm  Called to schedule appt - [place] fax in nurse box

## 2021-11-15 NOTE — Telephone Encounter (Signed)
Called and spoke to pt's daughter, [name], [place] approved.   Pt has not been seen since 02/25/2019 at virtual visit with Dr. Okey Dupre.  Pt was to follow up 6 weeks after last visit.   [name] states that pt's SBP generally runs in the 90s.  Advised pt's daughter that we schedule appointment to see pt in office.  Offered appt with Dr. Okey Dupre as well as APP for sooner appt.  [name] states she would like to schedule in [place] d/t convenience.  She does not have her calendar with her at this time and states she will call back to schedule appt.

## 2022-01-17 ENCOUNTER — Encounter: Payer: Self-pay | Admitting: Internal Medicine

## 2022-01-17 ENCOUNTER — Other Ambulatory Visit
Admission: RE | Admit: 2022-01-17 | Discharge: 2022-01-17 | Disposition: A | Discharge status: Home / Self Care | Payer: Medicare PPO | Attending: Internal Medicine | Admitting: Internal Medicine

## 2022-01-17 ENCOUNTER — Ambulatory Visit: Payer: Medicare PPO | Admitting: Internal Medicine

## 2022-01-17 VITALS — BP 104/50 | HR 73 | Ht 64.0 in | Wt 134.8 lb

## 2022-01-17 DIAGNOSIS — I351 Nonrheumatic aortic (valve) insufficiency: Secondary | ICD-10-CM

## 2022-01-17 DIAGNOSIS — R42 Dizziness and giddiness: Secondary | ICD-10-CM | POA: Diagnosis present

## 2022-01-17 DIAGNOSIS — I951 Orthostatic hypotension: Secondary | ICD-10-CM | POA: Diagnosis not present

## 2022-01-17 DIAGNOSIS — E785 Hyperlipidemia, unspecified: Secondary | ICD-10-CM

## 2022-01-17 DIAGNOSIS — I2510 Atherosclerotic heart disease of native coronary artery without angina pectoris: Secondary | ICD-10-CM | POA: Diagnosis present

## 2022-01-17 DIAGNOSIS — I959 Hypotension, unspecified: Secondary | ICD-10-CM

## 2022-01-17 LAB — COMPREHENSIVE METABOLIC PANEL
ALT: 14 U/L (ref 0–44)
AST: 20 U/L (ref 15–41)
Albumin: 3.3 g/dL — ABNORMAL LOW (ref 3.5–5.0)
Alkaline Phosphatase: 76 U/L (ref 38–126)
Anion gap: 7 (ref 5–15)
BUN: 26 mg/dL — ABNORMAL HIGH (ref 8–23)
CO2: 25 mmol/L (ref 22–32)
Calcium: 8.7 mg/dL — ABNORMAL LOW (ref 8.9–10.3)
Chloride: 108 mmol/L (ref 98–111)
Creatinine, Ser: 1.17 mg/dL — ABNORMAL HIGH (ref 0.44–1.00)
GFR, Estimated: 43 mL/min — ABNORMAL LOW (ref >60)
Glucose, Bld: 100 mg/dL — ABNORMAL HIGH (ref 70–99)
Potassium: 4.2 mmol/L (ref 3.5–5.1)
Sodium: 140 mmol/L (ref 135–145)
Total Bilirubin: 0.5 mg/dL (ref 0.3–1.2)
Total Protein: 6.7 g/dL (ref 6.5–8.1)

## 2022-01-17 LAB — CBC
HCT: 37.5 % (ref 36.0–46.0)
Hemoglobin: 12.3 g/dL (ref 12.0–15.0)
MCH: 31.5 pg (ref 26.0–34.0)
MCHC: 32.8 g/dL (ref 30.0–36.0)
MCV: 95.9 fL (ref 80.0–100.0)
Platelets: 212 10*3/uL (ref 150–400)
RBC: 3.91 MIL/uL (ref 3.87–5.11)
RDW: 12.5 % (ref 11.5–15.5)
WBC: 4.7 10*3/uL (ref 4.0–10.5)
nRBC: 0 % (ref 0.0–0.2)

## 2022-01-17 LAB — TSH: TSH: 2.184 u[IU]/mL (ref 0.350–4.500)

## 2022-01-17 LAB — LIPID PANEL
Cholesterol: 229 mg/dL — ABNORMAL HIGH (ref 0–200)
HDL: 64 mg/dL (ref >40)
LDL Cholesterol: 148 mg/dL — ABNORMAL HIGH (ref 0–99)
Total CHOL/HDL Ratio: 3.6 RATIO
Triglycerides: 85 mg/dL (ref <150)
VLDL: 17 mg/dL (ref 0–40)

## 2022-01-17 MED ORDER — ASPIRIN EC 81 MG PO TBEC: 81.0000 mg | DELAYED_RELEASE_TABLET | Freq: Every day | ORAL | Status: AC

## 2022-01-17 NOTE — Progress Notes (Signed)
? ?Follow-up Outpatient Visit ?Date: 01/17/2022 ? ?Primary Care Provider: ?Corine Shelter, MD ?[address] ?[place] [address] ? ?Chief Complaint: Dizziness and headaches ? ?HPI:  Erin Roberson is a 88 y.o. female with history of CAD s/p PCTA LAD (1992), mild aortic regurgitation, unprovoked PE on coumadin, hyperlipidemia, h/o uterine cancer, dementia, fibromyalgia, and atypical chest pain, who presents for follow-up of coronary artery disease, aortic regurgitation, and pulmonary embolism.  We last spoke via virtual visit in 01/2019 at which time she complained of lightheadedness.  She had previously been seen in the ED with near syncope attributed to dehydration.  I encouraged Erin Roberson to increase her fluid intake.  She was lost to follow-up until February, when her daughter reached out to [place] because of her mother having low blood pressures. ? ?Today, Erin Roberson complains of ongoing dizziness/lightheadedness that has been present for years.  It is not much different than when we last spoke.  She has been more bothered by headaches, though she is unable to characterize these further.  Her daughter notes that Erin Roberson sleeps a lot and could spend up to 16 or 18 hours a day in bed if there is not someone around to keep her awake.  She admits to not drinking much water.  She also does not eat as much as she should.  She had a fall about a month ago while getting ready to go to an appointment.  Erin Roberson does not recall what happened and her daughter did not witness the event.  She did not have any injuries. ? ?Erin Roberson does not have any chest pain, shortness of breath, palpitations, or edema.  The only medication she is taking at this time is Namzaric and some OTC pain medications for her headache.  She stopped taking aspirin and atorvastatin at some point over the last few years because she was concerned about taking too many medications at once.  She has continued to follow with Dr. Petra Kuba in  [place] but reports that she needs to establish with a new PCP. ? ?-------------------------------------------------------------------------------------------------- ? ?Past Medical History:  ?Diagnosis Date  ? Arthritis   ? Breast mass   ? left breast  ? Clotting disorder (HCC)   ? Coronary atherosclerosis   ? a. s/p PCTA LAD (1992). b. Last cath in 10/11 with nonobstructive disease, EF 60% by LV-gram.   ? Dementia (HCC)   ? Depression   ? Diverticulosis of colon (without mention of hemorrhage)   ? Fibromyalgia   ? Glaucoma   ? Hemorrhage of rectum and anus   ? History of uterine cancer   ? Iron deficiency anemia, unspecified   ? Leukopenia   ? chronic--benign  ? Other pulmonary embolism and infarction   ? 2011  ? Pure hypercholesterolemia   ? Thyroid disease   ? UTI (lower urinary tract infection)   ? ?Past Surgical History:  ?Procedure Laterality Date  ? ABDOMINAL HYSTERECTOMY    ? CARDIAC CATHETERIZATION  07/25/10  ? luminal irregularities in multiple vessels, no sig stenosis, EF 60%  ? CATARACT EXTRACTION  2004  ? right  ? COLONOSCOPY  07/01/2007  ? diverticulosis  ? CORONARY ANGIOPLASTY  1992  ? LAD  ? KIDNEY SURGERY    ? stent placed  ? NECK SURGERY    ? thyroid needle aspiration  12/2007  ? right, hyperplastic nodule  ? UPPER GASTROINTESTINAL ENDOSCOPY  07/16/2007  ? tortous esophagus with spasms  ? ? ?Current Meds  ?Medication  Sig  ? NAMZARIC 7-10 MG CP24 Take 1 capsule by mouth daily.  ? ? ?Allergies: Patient has no known allergies. ? ?Social History  ? ?Tobacco Use  ? Smoking status: Never  ? Smokeless tobacco: Never  ?Vaping Use  ? Vaping Use: Never used  ?Substance Use Topics  ? Alcohol use: No  ? Drug use: No  ? ? ?Family History  ?Problem Relation Age of Onset  ? Diabetes Mother   ?     deceased age 86  ? Hypertension Mother   ? Pancreatic cancer Father   ?     deceased age 86  ? Emphysema Brother   ?     deceased  ? Alcohol abuse Brother   ? Osteoporosis Other   ? Stroke Maternal Aunt   ? Colon  cancer Neg Hx   ? Heart attack Neg Hx   ? ? ?Review of Systems: ?A 12-system review of systems was performed and was negative except as noted in the HPI. ? ?-------------------------------------------------------------------------------------------------- ? ?Physical Exam: ?BP (!) 104/50 (BP Location: Left Arm, Patient Position: Sitting, Cuff Size: Normal)   Pulse 73   Ht 5\' 4"  (1.626 m)   Wt 134 lb 12.8 oz (61.1 kg)   SpO2 98%   BMI 23.14 kg/m?  ?Position Blood pressure (mmHg) Heart rate (bpm)  ?Lying 101/65 73  ?Sitting 102/65 81  ?Standing 92/59 (dizzy) 78  ?Standing (3 minutes) 94/60 92  ? ?General:  NAD.  Accompanied by her daughter. ?Neck: No JVD or HJR. ?Lungs: Clear to auscultation bilaterally without wheezes or crackles. ?Heart: Regular rate and rhythm without murmurs, rubs, or gallops. ?Abdomen: Soft, nontender, nondistended. ?Extremities: No lower extremity edema. ? ?EKG: Normal sinus rhythm with nonspecific ST/T changes.  No significant change from prior tracing on 12/21/2018. ? ?Lab Results  ?Component Value Date  ? WBC 3.8 (L) 12/21/2018  ? HGB 12.4 12/21/2018  ? HCT 38.6 12/21/2018  ? MCV 97.7 12/21/2018  ? PLT 182 12/21/2018  ? ? ?Lab Results  ?Component Value Date  ? NA 142 12/21/2018  ? K 3.9 12/21/2018  ? CL 112 (H) 12/21/2018  ? CO2 23 12/21/2018  ? BUN 20 12/21/2018  ? CREATININE 1.18 (H) 12/21/2018  ? GLUCOSE 123 (H) 12/21/2018  ? ALT 9 10/30/2018  ? ? ?Lab Results  ?Component Value Date  ? CHOL 202 (H) 10/30/2018  ? HDL 60 10/30/2018  ? LDLCALC 117 (H) 10/30/2018  ? LDLDIRECT 177 (H) 05/15/2018  ? TRIG 127 10/30/2018  ? CHOLHDL 3.4 10/30/2018  ? ? ?-------------------------------------------------------------------------------------------------- ? ?ASSESSMENT AND PLAN: ?Lightheadedness and orthostatic hypotension: ?This has been a longstanding problem for Erin Roberson and is likely multifactorial.  She continues to have poor fluid intake as well as some degree of malnutrition.  It is also  possible that side effects from Namzaric may be playing a role.  I have stressed the importance of staying well-hydrated and have encouraged Erin Roberson to drink at least 64 ounces of water per day.  I have also suggested that she liberalize her sodium intake.  She should reach out to Dr. Petra KubaKilpatrick regarding a trial of holding Namzaric to see if her symptoms improve.  We will repeat an echocardiogram to ensure there is no new structural abnormality underlying her lightheadedness, given history of mild aortic regurgitation in the past.  I will also check a CBC, CMP, and TSH today. ? ?Aortic regurgitation: ?Mild in the past.  [facility] at Erin Roberson convenience. ? ?Coronary artery disease and hyperlipidemia: ?No angina reported.  Lightheadedness has been longstanding and is unlikely to represent progression of her ischemic heart disease in the absence of other symptoms.  Nonspecific EKG changes are fairly stable dating back to 2020.  We have agreed to restart aspirin 81 mg daily.  I will check a lipid panel.  If LDL is significantly elevated, we will need to discuss risks versus benefits of restarting statin therapy in the setting of Ms. Thurow is advanced age and comorbidities at our follow-up visit. ? ?Follow-up: Return to clinic in 6 weeks. ? ?Yvonne Kendall, MD ?01/17/2022 ?4:51 PM ? ?

## 2022-01-17 NOTE — Patient Instructions (Addendum)
Medication Instructions:  ? ?Your physician has recommended you make the following change in your medication:  ? ?RESTART Aspirin 81 mg daily  ? ?Please reach out to your primary care Dr. Petra Kuba to discuss holding Namzaric to see if dizziness improves ? ?*If you need a refill on your cardiac medications before your next appointment, please call your pharmacy* ? ? ?Lab Work: ? ?Your physician recommends that you have lab work today: CBC, CMET, Lipid panel, TSH ? ?-  Please go to the [facility].  ?-  You will check in at the front desk to the right as you walk into the atrium.  ? ? ?If you have labs (blood work) drawn today and your tests are completely normal, you will receive your results only by: ?MyChart Message (if you have MyChart) OR ?A paper copy in the mail ?If you have any lab test that is abnormal or we need to change your treatment, we will call you to review the results. ? ? ?Testing/Procedures: ? ?Your physician has requested that you have an echocardiogram in [place]. Echocardiography is a painless test that uses sound waves to create images of your heart. It provides your doctor with information about the size and shape of your heart and how well your heart?s chambers and valves are working. This procedure takes approximately one hour. There are no restrictions for this procedure. ? ? ?Follow-Up: ?At [facility], you and your health needs are our priority.  As part of our continuing mission to provide you with exceptional heart care, we have created designated Provider Care Teams.  These Care Teams include your primary Cardiologist (physician) and Advanced Practice Providers (APPs -  Physician Assistants and Nurse Practitioners) who all work together to provide you with the care you need, when you need it. ? ?We recommend signing up for the patient portal called "MyChart".  Sign up information is provided on this After Visit Summary.  MyChart is used to connect with patients for  Virtual Visits (Telemedicine).  Patients are able to view lab/test results, encounter notes, upcoming appointments, etc.  Non-urgent messages can be sent to your provider as well.   ?To learn more about what you can do with MyChart, go to [email or web address].   ? ?Your next appointment:   ?6 week(s) ? ?The format for your next appointment:   ?In Person ? ?Provider:   ?You may see Yvonne Kendall, MD or one of the following Advanced Practice Providers on your designated Care Team:   ?Nicolasa Ducking, NP ?Eula Listen, PA-C ?Cadence Fransico Michael, PA-C ? ?Other Instructions ? ?Drink 64 oz of water per day ? ?Increase salt intake ? ? ?Important Information About Sugar ? ? ? ? ? ? ?

## 2022-01-18 ENCOUNTER — Telehealth: Payer: Self-pay | Admitting: *Deleted

## 2022-01-18 ENCOUNTER — Encounter: Payer: Self-pay | Admitting: Internal Medicine

## 2022-01-18 DIAGNOSIS — I351 Nonrheumatic aortic (valve) insufficiency: Secondary | ICD-10-CM | POA: Insufficient documentation

## 2022-01-18 NOTE — Telephone Encounter (Signed)
-----   Message from Yvonne Kendall, MD sent at 01/18/2022  6:55 AM EDT ----- ?Please let Erin Roberson know that her kidney function is slightly abnormal but stable compared to 3 years ago.  Her albumin is a little bit low, which we have seen in the past.  This is often seen with malnutrition.  Blood counts and thyroid function are normal.  Cholesterol is suboptimally controlled.  For now, I recommend moving forward with yesterday's plan to obtain an echocardiogram and add a low-dose aspirin.  We will discuss risks and benefits of statin therapy when Erin Roberson returns for follow-up in a few weeks.  I encouraged her to stay well-hydrated. ?

## 2022-01-18 NOTE — Telephone Encounter (Signed)
Patient daughter returning call. Will schedule 6 week fu at call back .  ?

## 2022-01-18 NOTE — Telephone Encounter (Signed)
Attempted to call Caren Griffins back. No answer. Lmtcb.  ?

## 2022-01-18 NOTE — Telephone Encounter (Signed)
Called pt's daughter, [name], ok per DPR re pt's labs.  ?[name] states she will call back, she is on another call.  ?

## 2022-01-19 NOTE — Telephone Encounter (Signed)
Attempted to call pt's daughter, [name]. No answer. Lmtcb x 2.  ? ?Will review results on return call. Pt also needs 6 week follow up scheduled with Dr. Okey Dupre or an APP.  ?

## 2022-01-19 NOTE — Addendum Note (Signed)
Addended by: Kendrick Fries on: 01/19/2022 08:46 AM ? ? Modules accepted: Orders ? ?

## 2022-01-23 NOTE — Telephone Encounter (Signed)
Attempted to call pt's daughter for third and final attempt. No answer. Left detailed message with result note below. (Ok per DPR) Notified Cynthia on vm that I have also sent a mychart message so she may review as well and to call with any further questions.  ?

## 2022-01-26 ENCOUNTER — Ambulatory Visit (HOSPITAL_COMMUNITY): Payer: Medicare PPO | Attending: Cardiovascular Disease

## 2022-01-26 DIAGNOSIS — I951 Orthostatic hypotension: Secondary | ICD-10-CM | POA: Diagnosis present

## 2022-01-26 DIAGNOSIS — I351 Nonrheumatic aortic (valve) insufficiency: Secondary | ICD-10-CM | POA: Diagnosis present

## 2022-01-26 LAB — ECHOCARDIOGRAM COMPLETE
Area-P 1/2: 3.03 cm2
P 1/2 time: 511 msec
S' Lateral: 2.3 cm

## 2022-01-31 ENCOUNTER — Ambulatory Visit: Payer: Medicare PPO | Admitting: Internal Medicine

## 2022-02-28 ENCOUNTER — Ambulatory Visit: Payer: Medicare PPO | Admitting: Medical

## 2022-03-06 ENCOUNTER — Ambulatory Visit: Payer: Medicare PPO | Admitting: Physician Assistant

## 2022-03-12 NOTE — Progress Notes (Signed)
Cardiology Office Note    Date:  03/13/2022   ID:  Erin Roberson, DOB 09-04-1924, MRN [id number]  PCP:  Vincente Liberty, MD  Cardiologist:  Nelva Bush, MD  Electrophysiologist:  None   Chief Complaint: Follow-up  History of Present Illness:   Erin Roberson is a 88 y.o. female with history of CAD status post PTCA to the LAD in 1992, mild aortic regurgitation, unprovoked PE treated with Coumadin, HLD, uterine cancer, dementia, fibromyalgia, and atypical chest pain who presents for follow-up of dizziness.  Most recent ischemic evaluation in 12/2015 showed no evidence of ischemia and was overall low risk with a normal LV systolic function.  Echo during admission for syncope and 11/2016 demonstrated an EF of 60 to 65%, no regional wall motion abnormalities, grade 1 diastolic dysfunction, trileaflet aortic valve with mildly calcified leaflets and mild regurgitation.  Carotid artery ultrasound at that time showed 1 to 39% bilateral ICA stenosis with antegrade flow of the bilateral vertebral arteries.  She has a history of lightheadedness and near syncope/syncope that have previously been attributed to dehydration with recommendation to increase fluid intake.  She was most recently seen in the office in 12/2021 with ongoing dizziness/lightheadedness that had been present for years.  It was not much different than when she was last evaluated.  She also noted headaches.  She was spending a lot of her time sleeping, at times up to 16 to 18 hours/day in bed and not drinking much water.  Her appetite was also diminished.  She had suffered a fall approximately 1 month prior to her visit.  She was without symptoms of angina or decompensation.  The only medication she was taking at that time was Namzaric and OTC pain medication for headache.  It was felt her lightheadedness/orthostatic hypotension were multifactorial including poor fluid intake and some degree of malnutrition.  There was also concern that  possible adverse effect from Namzaric may be playing a role.  She was encouraged to stay well-hydrated, liberalize salt intake, and discuss Namzaric with her PCP.  Echo on 01/26/2022, to evaluate for new structural abnormalities contributing to her symptoms, demonstrated an EF of 60 to 65%, no regional wall motion abnormalities, grade 1 diastolic dysfunction, normal RV systolic function and ventricular cavity size, mildly elevated PASP, mildly dilated left atrium, trivial mitral regurgitation, mild aortic regurgitation, and an estimated right atrial pressure of 3 mmHg.  She comes in accompanied by her daughter today who contributes to the history.  Initially, the patient felt like her dizziness was improved when compared to her prior visit.  However, her daughter indicates her dizziness is largely unchanged.  Patient describes this as a feeling of "weakness."  He does not feel like a room spinning sensation.  She denies any symptoms of palpitations.  No angina or dyspnea.  No frank syncope.  Though she does have a history of prior presyncope.  No significant lower extremity swelling.  She is not very active at baseline.  Her oral intake and appetite remains diminished.  She was started on midodrine 5 mg 3 times daily by outside office, though typically only takes this once daily, and has at times forgotten to take altogether.  She has not noticed any change in her dizziness with this medication.   Labs independently reviewed: 12/2021 - Hgb 12.3, PLT 212, potassium 4.2, BUN 26, serum creatinine 1.17, albumin 3.3, AST/ALT normal, TC 229, TG 85, HDL 64, LDL 148, TSH normal  Past Medical History:  Diagnosis  Date   Arthritis    Breast mass    left breast   Clotting disorder ([place])    Coronary atherosclerosis    a. s/p PCTA LAD (1992). b. Last cath in 10/11 with nonobstructive disease, EF 60% by LV-gram.    Dementia ([place])    Depression    Diverticulosis of colon (without mention of hemorrhage)     Fibromyalgia    Glaucoma    Hemorrhage of rectum and anus    History of uterine cancer    Iron deficiency anemia, unspecified    Leukopenia    chronic--benign   Other pulmonary embolism and infarction    2011   Pure hypercholesterolemia    Thyroid disease    UTI (lower urinary tract infection)     Past Surgical History:  Procedure Laterality Date   ABDOMINAL HYSTERECTOMY     CARDIAC CATHETERIZATION  07/25/10   luminal irregularities in multiple vessels, no sig stenosis, EF 60%   CATARACT EXTRACTION  2004   right   COLONOSCOPY  07/01/2007   diverticulosis   CORONARY ANGIOPLASTY  1992   LAD   KIDNEY SURGERY     stent placed   NECK SURGERY     thyroid needle aspiration  12/2007   right, hyperplastic nodule   UPPER GASTROINTESTINAL ENDOSCOPY  07/16/2007   tortous esophagus with spasms    Current Medications: Current Meds  Medication Sig   aspirin EC 81 MG tablet Take 1 tablet (81 mg total) by mouth daily.   midodrine (PROAMATINE) 5 MG tablet Take 15 mg by mouth daily.   NAMZARIC 7-10 MG CP24 Take 1 capsule by mouth daily.    Allergies:   Patient has no known allergies.   Social History   Socioeconomic History   Marital status: Divorced    Spouse name: Not on file   Number of children: Not on file   Years of education: Not on file   Highest education level: Not on file  Occupational History    Employer: RETIRED  Tobacco Use   Smoking status: Never   Smokeless tobacco: Never  Vaping Use   Vaping Use: Never used  Substance and Sexual Activity   Alcohol use: No   Drug use: No   Sexual activity: Not on file  Other Topics Concern   Not on file  Social History Narrative   Not on file   Social Determinants of Health   Financial Resource Strain: Not on file  Food Insecurity: Not on file  Transportation Needs: Not on file  Physical Activity: Not on file  Stress: Not on file  Social Connections: Not on file     Family History:  The patient's family  history includes Alcohol abuse in her brother; Diabetes in her mother; Emphysema in her brother; Hypertension in her mother; Osteoporosis in an other family member; Pancreatic cancer in her father; Stroke in her maternal aunt. There is no history of Colon cancer or Heart attack.  ROS:   12-point review of systems is negative unless otherwise noted in the HPI.   EKGs/Labs/Other Studies Reviewed:    Studies reviewed were summarized above. The additional studies were reviewed today:  2D echo 01/26/2022:  1. Left ventricular ejection fraction, by estimation, is 60 to 65%. The  left ventricle has normal function. The left ventricle has no regional  wall motion abnormalities. Left ventricular diastolic parameters are  consistent with Grade I diastolic  dysfunction (impaired relaxation).   2. Right ventricular systolic function is normal.  The right ventricular  size is normal. There is mildly elevated pulmonary artery systolic  pressure.   3. Left atrial size was mildly dilated.   4. The mitral valve is normal in structure. Trivial mitral valve  regurgitation. No evidence of mitral stenosis.   5. The aortic valve is tricuspid. Aortic valve regurgitation is mild. No  aortic stenosis is present. Aortic regurgitation PHT measures 511 msec.   6. The inferior vena cava is normal in size with greater than 50%  respiratory variability, suggesting right atrial pressure of 3 mmHg. __________  2D echo 11/11/2016: - Left ventricle: The cavity size was normal. Systolic function was    normal. The estimated ejection fraction was in the range of 60%    to 65%. Wall motion was normal; there were no regional wall    motion abnormalities. Doppler parameters are consistent with    abnormal left ventricular relaxation (grade 1 diastolic    dysfunction).  - Aortic valve: Trileaflet; mildly thickened, mildly calcified    leaflets. There was mild regurgitation.  - Left atrium: The atrium was at the upper  limits of normal in    size. Volume/bsa, ES, (1-plane Simpson&'s, A2C): 33 ml/m^2. __________  Carlton Adam MPI 01/17/2016: Nuclear stress EF: 59%. No wall motion abnormalities There was no ST segment deviation noted during stress. This is a low risk study. Defect 1: There is a small defect of mild severity present in the apex location. No ischemia    EKG:  EKG is not ordered today.    Recent Labs: 01/17/2022: ALT 14; BUN 26; Creatinine, Ser 1.17; Hemoglobin 12.3; Platelets 212; Potassium 4.2; Sodium 140; TSH 2.184  Recent Lipid Panel    Component Value Date/Time   CHOL 229 (H) 01/17/2022 1754   CHOL 202 (H) 10/30/2018 1511   TRIG 85 01/17/2022 1754   HDL 64 01/17/2022 1754   HDL 60 10/30/2018 1511   CHOLHDL 3.6 01/17/2022 1754   VLDL 17 01/17/2022 1754   LDLCALC 148 (H) 01/17/2022 1754   LDLCALC 117 (H) 10/30/2018 1511   LDLDIRECT 177 (H) 05/15/2018 1636   LDLDIRECT 138.4 02/13/2013 1307    PHYSICAL EXAM:    VS:  BP (!) 110/58 (BP Location: Left Arm, Patient Position: Sitting, Cuff Size: Normal)   Pulse 79   Ht 5\' 4"  (1.626 m)   Wt 134 lb 3.2 oz (60.9 kg)   SpO2 98%   BMI 23.04 kg/m   BMI: Body mass index is 23.04 kg/m.  Physical Exam Vitals reviewed.  Constitutional:      Appearance: She is well-developed.  HENT:     Head: Normocephalic and atraumatic.  Eyes:     General:        Right eye: No discharge.        Left eye: No discharge.  Neck:     Vascular: No JVD.  Cardiovascular:     Rate and Rhythm: Normal rate and regular rhythm.     Pulses:          Posterior tibial pulses are 2+ on the right side and 2+ on the left side.     Heart sounds: Normal heart sounds, S1 normal and S2 normal. Heart sounds not distant. No midsystolic click and no opening snap. No murmur heard.    No friction rub.  Pulmonary:     Effort: Pulmonary effort is normal. No respiratory distress.     Breath sounds: Normal breath sounds. No decreased breath sounds, wheezing or rales.   Chest:  Chest wall: No tenderness.  Abdominal:     General: There is no distension.     Palpations: Abdomen is soft.     Tenderness: There is no abdominal tenderness.  Musculoskeletal:     Cervical back: Normal range of motion.     Right lower leg: No edema.     Left lower leg: No edema.  Skin:    General: Skin is warm and dry.     Nails: There is no clubbing.  Neurological:     Mental Status: She is alert and oriented to person, place, and time.  Psychiatric:        Speech: Speech normal.        Behavior: Behavior normal.        Thought Content: Thought content normal.        Judgment: Judgment normal.     Wt Readings from Last 3 Encounters:  03/13/22 134 lb 3.2 oz (60.9 kg)  01/17/22 134 lb 12.8 oz (61.1 kg)  02/25/19 130 lb (59 kg)     ASSESSMENT & PLAN:   Lightheadedness with orthostatic hypotension/dizziness/presyncope: Longstanding issue and largely stable.  Felt to be multifactorial including poor fluid intake and some degree of malnutrition.  Cannot exclude pharmacotherapy as well.  I have encouraged her to try to get midodrine and at least twice daily.  We will also place a Zio patch to evaluate for any significant arrhythmia, prolonged pauses, or high-grade AV block contributing to her symptoms.  Echo showed no significant structural heart disease.  Recent labs did show mild dehydration with a slight hypoalbuminemia.  Oral and caloric intake were encouraged.  Coronary artery calcification/HLD: No symptoms concerning for angina or decompensation.  She remains on low-dose aspirin.  She has not been maintained on statin therapy given advanced age.  No indication for ischemic testing at this time.  Aortic regurgitation: Stable and mild on most recent echo.  Noncontributory to her longstanding dizziness.   Disposition: F/u with Dr. Saunders Revel or an APP in 2 months.   Medication Adjustments/Labs and Tests Ordered: Current medicines are reviewed at length with the patient  today.  Concerns regarding medicines are outlined above. Medication changes, Labs and Tests ordered today are summarized above and listed in the Patient Instructions accessible in Encounters.   Signed, Christell Faith, PA-C 03/13/2022 4:32 PM     [address] [place], [place] [address] [phone]

## 2022-03-13 ENCOUNTER — Ambulatory Visit: Payer: Self-pay

## 2022-03-13 ENCOUNTER — Encounter: Payer: Self-pay | Admitting: Physician Assistant

## 2022-03-13 ENCOUNTER — Ambulatory Visit: Payer: Medicare PPO | Admitting: Physician Assistant

## 2022-03-13 VITALS — BP 110/58 | HR 79 | Ht 64.0 in | Wt 134.2 lb

## 2022-03-13 DIAGNOSIS — R55 Syncope and collapse: Secondary | ICD-10-CM

## 2022-03-13 DIAGNOSIS — I351 Nonrheumatic aortic (valve) insufficiency: Secondary | ICD-10-CM

## 2022-03-13 DIAGNOSIS — R42 Dizziness and giddiness: Secondary | ICD-10-CM

## 2022-03-13 DIAGNOSIS — I2510 Atherosclerotic heart disease of native coronary artery without angina pectoris: Secondary | ICD-10-CM

## 2022-03-13 DIAGNOSIS — I2584 Coronary atherosclerosis due to calcified coronary lesion: Secondary | ICD-10-CM

## 2022-03-13 DIAGNOSIS — I951 Orthostatic hypotension: Secondary | ICD-10-CM

## 2022-03-13 DIAGNOSIS — E785 Hyperlipidemia, unspecified: Secondary | ICD-10-CM

## 2022-03-13 DIAGNOSIS — E782 Mixed hyperlipidemia: Secondary | ICD-10-CM

## 2022-03-13 NOTE — Patient Instructions (Signed)
Medication Instructions:   Your physician recommends that you continue on your current medications as directed. Please refer to the Current Medication list given to you today.  *If you need a refill on your cardiac medications before your next appointment, please call your pharmacy*   Lab Work:  None ordered  Testing/Procedures:  Your physician has recommended that you wear a Zio monitor for TWO WEEKS. This will be mailed to you home.   This monitor is a medical device that records the heart's electrical activity. Doctors most often use these monitors to diagnose arrhythmias. Arrhythmias are problems with the speed or rhythm of the heartbeat. The monitor is a small device applied to your chest. You can wear one while you do your normal daily activities. While wearing this monitor if you have any symptoms to push the button and record what you felt. Once you have worn this monitor for the period of time provider prescribed (Usually 14 days), you will return the monitor device in the postage paid box. Once it is returned they will download the data collected and provide [place] with a report which the provider will then review and we will call you with those results. Important tips:  Avoid showering during the first 24 hours of wearing the monitor. Avoid excessive sweating to help maximize wear time. Do not submerge the device, no hot tubs, and no swimming pools. Keep any lotions or oils away from the patch. After 24 hours you may shower with the patch on. Take brief showers with your back facing the shower head.  Do not remove patch once it has been placed because that will interrupt data and decrease adhesive wear time. Push the button when you have any symptoms and write down what you were feeling. Once you have completed wearing your monitor, remove and place into box which has postage paid and place in your outgoing mailbox.  If for some reason you have misplaced your box then call our office  and we can provide another box and/or mail it off for you.   Follow-Up: At [facility], you and your health needs are our priority.  As part of our continuing mission to provide you with exceptional heart care, we have created designated Provider Care Teams.  These Care Teams include your primary Cardiologist (physician) and Advanced Practice Providers (APPs -  Physician Assistants and Nurse Practitioners) who all work together to provide you with the care you need, when you need it.  We recommend signing up for the patient portal called "MyChart".  Sign up information is provided on this After Visit Summary.  MyChart is used to connect with patients for Virtual Visits (Telemedicine).  Patients are able to view lab/test results, encounter notes, upcoming appointments, etc.  Non-urgent messages can be sent to your provider as well.   To learn more about what you can do with MyChart, go to [email or web address].    Your next appointment:   2 month(s)  The format for your next appointment:   In Person  Provider:   Eula Listen, PA-C{  Important Information About Sugar

## 2022-05-21 ENCOUNTER — Ambulatory Visit: Payer: Medicare PPO | Admitting: Physician Assistant

## 2023-01-15 ENCOUNTER — Other Ambulatory Visit (HOSPITAL_COMMUNITY): Payer: Self-pay

## 2023-09-20 ENCOUNTER — Ambulatory Visit (HOSPITAL_COMMUNITY)
Admission: EM | Admit: 2023-09-20 | Discharge: 2023-09-20 | Disposition: A | Discharge status: Home / Self Care | Payer: Medicare PPO | Attending: Internal Medicine | Admitting: Internal Medicine

## 2023-09-20 ENCOUNTER — Encounter (HOSPITAL_COMMUNITY): Payer: Self-pay

## 2023-09-20 DIAGNOSIS — Z9183 Wandering in diseases classified elsewhere: Secondary | ICD-10-CM

## 2023-09-20 DIAGNOSIS — R4689 Other symptoms and signs involving appearance and behavior: Secondary | ICD-10-CM | POA: Diagnosis not present

## 2023-09-20 DIAGNOSIS — F03A18 Unspecified dementia, mild, with other behavioral disturbance: Secondary | ICD-10-CM

## 2023-09-20 LAB — POCT URINALYSIS DIP (MANUAL ENTRY)
Bilirubin, UA: NEGATIVE
Blood, UA: NEGATIVE
Glucose, UA: NEGATIVE mg/dL
Ketones, POC UA: NEGATIVE mg/dL
Leukocytes, UA: NEGATIVE
Nitrite, UA: NEGATIVE
Protein Ur, POC: NEGATIVE mg/dL
Spec Grav, UA: 1.02 (ref 1.010–1.025)
Urobilinogen, UA: 0.2 U/dL
pH, UA: 7 (ref 5.0–8.0)

## 2023-09-20 NOTE — ED Triage Notes (Signed)
Daughter brought patient in today with concerns of being forgetful and wondering X 10 days. Her daughter is concerned with possible UTI or dehydration.   Patient has also has a productive coughing and headaches for a long time. Daughter states that the cough has been going on for over a year.   Patient would like her ears checked as well because she cannot hear well.

## 2023-09-20 NOTE — ED Provider Notes (Signed)
[facility]    CSN: [id number] Arrival date & time: 09/20/23  1802      History   Chief Complaint Chief Complaint  Patient presents with   Dementia    HPI Erin Roberson is a 89 y.o. female.   89 year old female who is brought in to urgent care by her daughter for concerns of a possible urinary tract infection as well as a cough.  The daughter reports that for the last 10 days or so she has been called 3 times to come get her mom by the neighbors.  1 time the patient was brought back by the police.  The daughter lives in [place] and is not local.  She reports that when she is around her mom, her mom seems at baseline with answering questions appropriately although she is very hard of hearing as she is lost her hearing aids.  The daughter reports that she also sleeps for numerous hours at a time but does still get herself up and dresses herself.  She does not know if she is eating or drinking well but does think that she gets up and gets things to eat and drink as she is not losing weight.  She has not seen her primary care physician in quite some time.  She denies any pain, fevers, chills, nausea, vomiting, abdominal pain, difficulty urinating.  The patient seems to answer questions appropriately during the exam although she has a hard time hearing and sometimes does not understand what is being asked due to this.     Past Medical History:  Diagnosis Date   Arthritis    Breast mass    left breast   Clotting disorder (HCC)    Coronary atherosclerosis    a. s/p PCTA LAD (1992). b. Last cath in 10/11 with nonobstructive disease, EF 60% by LV-gram.    Dementia (HCC)    Depression    Diverticulosis of colon (without mention of hemorrhage)    Fibromyalgia    Glaucoma    Hemorrhage of rectum and anus    History of uterine cancer    Iron deficiency anemia, unspecified    Leukopenia    chronic--benign   Other pulmonary embolism and infarction    2011   Pure  hypercholesterolemia    Thyroid disease    UTI (lower urinary tract infection)     Patient Active Problem List   Diagnosis Date Noted   Aortic valve regurgitation 01/18/2022   Lightheadedness 02/25/2019   Cough 02/25/2019   Coronary artery disease involving native coronary artery of native heart without angina pectoris 09/05/2018   Hyperlipidemia LDL goal <70 09/05/2018   Near syncope 11/10/2016   Bradycardia 11/10/2016   Chronic anticoagulation - Coumadin for history of PE 11/08/2015   Encounter for therapeutic drug monitoring 10/30/2013   Chronic LLQ pain 12/13/2011   Dementia (HCC) 12/13/2011   Breast lesion, Left, 12 o'clock. 10/28/2011   Constipation, chronic 08/09/2011   Diverticulosis of colon 08/09/2011   Long term current use of anticoagulant 11/18/2010   PULMONARY EMBOLISM 08/15/2010   HEMATURIA, HX OF 08/15/2010   Dizziness 08/30/2009   CHEST PAIN, ATYPICAL 08/30/2009   UNSTEADY GAIT 08/17/2009   HYPERCHOLESTEROLEMIA 12/18/2007   LEUKOPENIA, MILD 12/18/2007   DEPRESSION 12/18/2007   Coronary atherosclerosis 12/18/2007    Past Surgical History:  Procedure Laterality Date   ABDOMINAL HYSTERECTOMY     CARDIAC CATHETERIZATION  07/25/10   luminal irregularities in multiple vessels, no sig stenosis, EF 60%  CATARACT EXTRACTION  2004   right   COLONOSCOPY  07/01/2007   diverticulosis   CORONARY ANGIOPLASTY  1992   LAD   KIDNEY SURGERY     stent placed   NECK SURGERY     thyroid needle aspiration  12/2007   right, hyperplastic nodule   UPPER GASTROINTESTINAL ENDOSCOPY  07/16/2007   tortous esophagus with spasms    OB History   No obstetric history on file.      Home Medications    Prior to Admission medications   Medication Sig Start Date End Date Taking? Authorizing Provider  aspirin EC 81 MG tablet Take 1 tablet (81 mg total) by mouth daily. 01/17/22   End, Cristal Deer, MD  midodrine (PROAMATINE) 5 MG tablet Take 15 mg by mouth daily. Patient not  taking: Reported on 09/20/2023 08/29/21   [provider]  NAMZARIC 7-10 MG CP24 Take 1 capsule by mouth daily. 10/06/16   [provider]    Family History Family History  Problem Relation Age of Onset   Diabetes Mother        deceased age 18   Hypertension Mother    Pancreatic cancer Father        deceased age 47   Emphysema Brother        deceased   Alcohol abuse Brother    Osteoporosis Other    Stroke Maternal Aunt    Colon cancer Neg Hx    Heart attack Neg Hx     Social History Social History   Tobacco Use   Smoking status: Never   Smokeless tobacco: Never  Vaping Use   Vaping status: Never Used  Substance Use Topics   Alcohol use: No   Drug use: No     Allergies   Patient has no known allergies.   Review of Systems Review of Systems  Constitutional:  Negative for chills and fever.  HENT:  Negative for ear pain and sore throat.   Eyes:  Negative for pain and visual disturbance.  Respiratory:  Positive for cough. Negative for shortness of breath.   Cardiovascular:  Negative for chest pain and palpitations.  Gastrointestinal:  Negative for abdominal pain and vomiting.  Genitourinary:  Negative for dysuria and hematuria.  Musculoskeletal:  Negative for arthralgias and back pain.  Skin:  Negative for color change and rash.  Neurological:  Negative for seizures and syncope.  All other systems reviewed and are negative.    Physical Exam Triage Vital Signs ED Triage Vitals  Encounter Vitals Group     BP 09/20/23 1827 (!) 116/57     Systolic BP Percentile --      Diastolic BP Percentile --      Pulse Rate 09/20/23 1827 72     Resp 09/20/23 1827 16     Temp 09/20/23 1827 97.9 F (36.6 C)     Temp Source 09/20/23 1827 Oral     SpO2 09/20/23 1827 97 %     Weight 09/20/23 1827 120 lb (54.4 kg)     Height 09/20/23 1827 5\' 3"  (1.6 m)     Head Circumference --      Peak Flow --      Pain Score 09/20/23 1826 0     Pain Loc --      Pain  Education --      Exclude from Growth Chart --    No data found.  Updated Vital Signs BP (!) 116/57 (BP Location: Right Arm)   Pulse  72   Temp 97.9 F (36.6 C) (Oral)   Resp 16   Ht 5\' 3"  (1.6 m)   Wt 120 lb (54.4 kg)   SpO2 97%   BMI 21.26 kg/m   Visual Acuity Right Eye Distance:   Left Eye Distance:   Bilateral Distance:    Right Eye Near:   Left Eye Near:    Bilateral Near:     Physical Exam Vitals and nursing note reviewed.  Constitutional:      General: She is not in acute distress.    Appearance: She is well-developed.  HENT:     Head: Normocephalic and atraumatic.     Right Ear: Tympanic membrane normal.     Left Ear: Tympanic membrane normal.     Mouth/Throat:     Mouth: Mucous membranes are moist.  Eyes:     Conjunctiva/sclera: Conjunctivae normal.  Cardiovascular:     Rate and Rhythm: Normal rate and regular rhythm.     Heart sounds: No murmur heard. Pulmonary:     Effort: Pulmonary effort is normal. No respiratory distress.     Breath sounds: Normal breath sounds.  Abdominal:     Palpations: Abdomen is soft.     Tenderness: There is no abdominal tenderness.  Musculoskeletal:        General: No swelling.     Cervical back: Neck supple.  Skin:    General: Skin is warm and dry.     Capillary Refill: Capillary refill takes less than 2 seconds.  Neurological:     Mental Status: She is alert.  Psychiatric:        Mood and Affect: Mood normal.      UC Treatments / Results  Labs (all labs ordered are listed, but only abnormal results are displayed) Labs Reviewed  POCT URINALYSIS DIP (MANUAL ENTRY)    EKG   Radiology No results found.  Procedures Procedures (including critical care time)  Medications Ordered in UC Medications - No data to display  Initial Impression / Assessment and Plan / UC Course  I have reviewed the triage vital signs and the nursing notes.  Pertinent labs & imaging results that were available during my care of  the patient were reviewed by me and considered in my medical decision making (see chart for details).     Wandering  Mild dementia with other behavioral disturbance, unspecified dementia type (HCC)   Urinalysis is negative for infection or dehydration. As the patient is communicating well, answer question appropriately and the symptoms do not appear to be from an infection, then we feel that this can be evaluated by her primary care physician as an outpatient. Recommend contacting them as soon as possible to discuss increased wandering. Likely needs more extensive work up then capable at urgent care but does not seem to need emergency room work-up.  Did given the family information about [facility] and discuss talking with an agency to see if someone can help to stay with her mom during the day.   Return to urgent care or PCP if symptoms worsen or fail to resolve.    Final Clinical Impressions(s) / UC Diagnoses   Final diagnoses:  Wandering  Mild dementia with other behavioral disturbance, unspecified dementia type [facility])     Discharge Instructions      Urinalysis is negative for infection or dehydration. As the patient is communicating well and the symptoms do not appear to be from an infection, then we feel that this can be evaluated  by her primary care physician. Recommend contacting them as soon as possible to discuss increased wandering. Likely needs more extensive work up then capable at urgent care but does not seem to need emergency room work-up.   ED Prescriptions   None    PDMP not reviewed this encounter.   Landis Martins, [place] 09/20/23 1918

## 2023-09-20 NOTE — Discharge Instructions (Addendum)
Urinalysis is negative for infection or dehydration. As the patient is communicating well and the symptoms do not appear to be from an infection, then we feel that this can be evaluated by her primary care physician. Recommend contacting them as soon as possible to discuss increased wandering. Likely needs more extensive work up then capable at urgent care but does not seem to need emergency room work-up.

## 2023-10-15 ENCOUNTER — Ambulatory Visit: Payer: Medicare PPO | Admitting: Family Medicine

## 2023-10-15 ENCOUNTER — Ambulatory Visit: Payer: Medicare PPO | Admitting: Nurse Practitioner

## 2023-10-15 ENCOUNTER — Encounter: Payer: Self-pay | Admitting: Family Medicine

## 2023-10-15 VITALS — BP 114/62 | HR 80 | Temp 97.6°F | Ht 63.75 in | Wt 128.4 lb

## 2023-10-15 DIAGNOSIS — Z742 Need for assistance at home and no other household member able to render care: Secondary | ICD-10-CM

## 2023-10-15 DIAGNOSIS — R296 Repeated falls: Secondary | ICD-10-CM | POA: Diagnosis not present

## 2023-10-15 DIAGNOSIS — I2510 Atherosclerotic heart disease of native coronary artery without angina pectoris: Secondary | ICD-10-CM

## 2023-10-15 DIAGNOSIS — R54 Age-related physical debility: Secondary | ICD-10-CM

## 2023-10-15 DIAGNOSIS — R42 Dizziness and giddiness: Secondary | ICD-10-CM

## 2023-10-15 DIAGNOSIS — R4689 Other symptoms and signs involving appearance and behavior: Secondary | ICD-10-CM | POA: Diagnosis not present

## 2023-10-15 DIAGNOSIS — R4181 Age-related cognitive decline: Secondary | ICD-10-CM | POA: Insufficient documentation

## 2023-10-15 DIAGNOSIS — R400 Somnolence: Secondary | ICD-10-CM

## 2023-10-15 DIAGNOSIS — R053 Chronic cough: Secondary | ICD-10-CM

## 2023-10-15 DIAGNOSIS — H903 Sensorineural hearing loss, bilateral: Secondary | ICD-10-CM | POA: Insufficient documentation

## 2023-10-15 NOTE — Assessment & Plan Note (Signed)
 Referral to audiology and ENT to assist with hearing assessment

## 2023-10-15 NOTE — Assessment & Plan Note (Signed)
 Patient reports episodes of dizziness resulting in falls, increasing risk of injury.  Differential Diagnosis:  Orthostatic hypotension: Likely due to low blood pressure. Cardiac arrhythmias: Possible given cardiac history. Vestibular dysfunction: To be evaluated.  Plan: Follow up with cardiology, requesting [facility] referral in [facility] as this is closer to patient's home Physical Therapy for balance training and fall prevention strategies and fall precautions Monitor for further episodes. Follow up with cardiology and physical therapy as scheduled.

## 2023-10-15 NOTE — Progress Notes (Signed)
 Assessment/Plan:   Problem List Items Addressed This Visit       Cardiovascular and Mediastinum   Coronary atherosclerosis   Relevant Medications   simvastatin  (ZOCOR ) 20 MG tablet   Other Relevant Orders   Ambulatory referral to Cardiology   AMB Referral VBCI Care Management     Nervous and Auditory   Sensorineural hearing loss (SNHL) of both ears   Referral to audiology and ENT to assist with hearing assessment      Relevant Orders   Ambulatory referral to Audiology   AMB Referral VBCI Care Management   Ambulatory referral to ENT     Other   Orthostatic dizziness   Relevant Orders   Ambulatory referral to Cardiology   Ambulatory referral to Neurology   Ambulatory referral to Home Health   AMB Referral VBCI Care Management   Ambulatory referral to ENT   Age-related cognitive decline   Patient exhibits progressive memory loss, confusion, wandering behavior, repetitive actions, and vivid dreams suggestive of cognitive decline. Currently on combination donepezil /memantine   Differential Diagnosis:  Alzheimer's disease: Due to progressive memory loss and wandering episodes. Vascular dementia: Considered given cardiovascular history. Lewy body dementia: Possible due to vivid dreams and behavioral changes.  Pan:  Neurology Referral for comprehensive evaluation of cognitive function and memory concerns. Continue current memory medication (Namzaric ) as prescribed. Home Health referral to PT and HHA to implement strategies to prevent wandering, falls, including increased supervision. Advise on managing symptoms and ensuring patient safety. Monitor cognitive changes and report any acute deterioration. Follow up with neurology as scheduled.      Relevant Orders   Ambulatory referral to Geriatrics   Ambulatory referral to Neurology   Ambulatory referral to Home Health   AMB Referral VBCI Care Management   Wandering   Relevant Orders   Ambulatory referral to Geriatrics    Ambulatory referral to Neurology   AMB Referral VBCI Care Management   Falls - Primary   Patient reports episodes of dizziness resulting in falls, increasing risk of injury.  Differential Diagnosis:  Orthostatic hypotension: Likely due to low blood pressure. Cardiac arrhythmias: Possible given cardiac history. Vestibular dysfunction: To be evaluated.  Plan: Follow up with cardiology, requesting [facility] referral in [facility] as this is closer to patient's home Physical Therapy for balance training and fall prevention strategies and fall precautions Monitor for further episodes. Follow up with cardiology and physical therapy as scheduled.      Relevant Orders   Ambulatory referral to Geriatrics   Ambulatory referral to Home Health   AMB Referral VBCI Care Management   Frailty syndrome in geriatric patient   Referral to geriatrics to help coordinate care comprehensively      Relevant Orders   Ambulatory referral to Geriatrics   AMB Referral VBCI Care Management   Somnolence   Patient experiences excessive sleep, sleeping up to 18 hours per day, leading to missed meals and decreased daytime functioning.   Plan: Keep a sleep diary to identify patterns and potential triggers. Assess during neurology consultation for underlying causes.       Relevant Orders   Ambulatory referral to Neurology   Needs assistance while at home   Patient is unable to manage daily activities independently, requiring increased care and supervision.  Plan:  Social Work Referral Home Health Services Consider Respite Care Options      Relevant Orders   Ambulatory referral to Home Health   AMB Referral VBCI Care Management    There are no discontinued  medications.  Return if symptoms worsen or fail to improve.    Subjective:   Encounter date: 10/15/2023  Erin Roberson is a 89 y.o. female who has HYPERCHOLESTEROLEMIA; LEUKOPENIA, MILD; DEPRESSION; Coronary atherosclerosis; PULMONARY  EMBOLISM; Orthostatic dizziness; UNSTEADY GAIT; CHEST PAIN, ATYPICAL; HEMATURIA, HX OF; Long term current use of anticoagulant; Constipation, chronic; Diverticulosis of colon; Breast lesion, Left, 12 o'clock.; Chronic LLQ pain; Dementia (HCC); Encounter for therapeutic drug monitoring; Chronic anticoagulation - Coumadin  for history of PE; Near syncope; Bradycardia; Coronary artery disease involving native coronary artery of native heart without angina pectoris; Hyperlipidemia LDL goal <70; Lightheadedness; Cough; Aortic valve regurgitation; Age-related cognitive decline; Wandering; Falls; Sensorineural hearing loss (SNHL) of both ears; Frailty syndrome in geriatric patient; Somnolence; and Needs assistance while at home on their problem list..   She  has a past medical history of Arthritis, Breast mass, Clotting disorder (HCC), Coronary atherosclerosis, Dementia (HCC), Depression, Diverticulosis of colon (without mention of hemorrhage), Fibromyalgia, Glaucoma, Hemorrhage of rectum and anus, History of uterine cancer, Iron deficiency anemia, unspecified, Leukopenia, Other pulmonary embolism and infarction, Pure hypercholesterolemia, Thyroid  disease, and UTI (lower urinary tract infection)..   Chief Complaint: Cognitive decline, dizziness, and need for increased care.  History of Present Illness:  Patient presents with progressive memory loss, confusion, and wandering behavior over the past months. Incidents include leaving home at 4 AM, visiting neighbors claiming to be locked out, and searching for her deceased mother. Repetitive behaviors noted, such as repeatedly searching her pocketbook for dentures. Reports excessive sleeping, up to 18 hours per day, leading to missed meals. Experiences episodes of dizziness and lethargy, with recent falls. Describes vivid dreams involving loud talking during sleep. Chronic deep cough present for years, producing thick mucus requiring expectoration. Hearing impairment  noted; difficulty understanding conversations even with hearing aids. Patient lives alone. Caregiver (daughter) reports inability to manage finances or prepare meals independently. Seeking additional support and respite care services.  Review of Systems:  Constitutional: Excessive sleepiness, lethargy. ENT: Hearing loss. Respiratory: Chronic cough with sputum production. Cardiovascular: Dizziness. Gastrointestinal: Denies abdominal pain. Neurological: Memory loss, confusion, falls. Psychiatric: Vivid dreams, behavioral changes, repetitive behaviors. All other systems: Negative.  Past Surgical History:  Procedure Laterality Date   ABDOMINAL HYSTERECTOMY     CARDIAC CATHETERIZATION  07/25/10   luminal irregularities in multiple vessels, no sig stenosis, EF 60%   CATARACT EXTRACTION  2004   right   COLONOSCOPY  07/01/2007   diverticulosis   CORONARY ANGIOPLASTY  1992   LAD   KIDNEY SURGERY     stent placed   NECK SURGERY     thyroid  needle aspiration  12/2007   right, hyperplastic nodule   UPPER GASTROINTESTINAL ENDOSCOPY  07/16/2007   tortous esophagus with spasms    Outpatient Medications Prior to Visit  Medication Sig Dispense Refill   aspirin  EC 81 MG tablet Take 1 tablet (81 mg total) by mouth daily.     NAMZARIC  7-10 MG CP24 Take 1 capsule by mouth daily.  3   simvastatin  (ZOCOR ) 20 MG tablet 20 mg.     midodrine  (PROAMATINE ) 5 MG tablet Take 15 mg by mouth daily. (Patient not taking: Reported on 10/15/2023)     No facility-administered medications prior to visit.    Family History  Problem Relation Age of Onset   Diabetes Mother        deceased age 61   Hypertension Mother    Pancreatic cancer Father        deceased age 32  Emphysema Brother        deceased   Alcohol abuse Brother    Osteoporosis Other    Stroke Maternal Aunt    Colon cancer Neg Hx    Heart attack Neg Hx     Social History   Socioeconomic History   Marital status: Divorced    Spouse  name: Not on file   Number of children: Not on file   Years of education: Not on file   Highest education level: Not on file  Occupational History    Employer: RETIRED  Tobacco Use   Smoking status: Never   Smokeless tobacco: Never  Vaping Use   Vaping status: Never Used  Substance and Sexual Activity   Alcohol use: No   Drug use: No   Sexual activity: Not on file  Other Topics Concern   Not on file  Social History Narrative   Not on file   Social Drivers of Health   Financial Resource Strain: Not on file  Food Insecurity: Not on file  Transportation Needs: Not on file  Physical Activity: Not on file  Stress: Not on file  Social Connections: Not on file  Intimate Partner Violence: Not on file                                                                                                  Objective:  Physical Exam: BP 114/62   Pulse 80   Temp 97.6 F (36.4 C) (Temporal)   Ht 5' 3.75 (1.619 m)   Wt 128 lb 6.4 oz (58.2 kg)   SpO2 97%   BMI 22.21 kg/m     Physical Exam Constitutional:      General: She is not in acute distress.    Appearance: Normal appearance. She is not ill-appearing or toxic-appearing.  HENT:     Head: Normocephalic and atraumatic.     Nose: Nose normal. No congestion.  Eyes:     General: No scleral icterus.    Extraocular Movements: Extraocular movements intact.  Cardiovascular:     Rate and Rhythm: Normal rate and regular rhythm.     Pulses: Normal pulses.     Heart sounds: Normal heart sounds.  Pulmonary:     Effort: Pulmonary effort is normal. No respiratory distress.     Breath sounds: Normal breath sounds.  Abdominal:     General: Abdomen is flat. Bowel sounds are normal.     Palpations: Abdomen is soft.  Musculoskeletal:        General: Normal range of motion.  Lymphadenopathy:     Cervical: No cervical adenopathy.  Skin:    General: Skin is warm and dry.     Findings: No rash.  Neurological:     General: No focal  deficit present.     Mental Status: She is alert.     Gait: Gait normal.  Psychiatric:        Mood and Affect: Affect is not inappropriate.        Behavior: Behavior is cooperative.        Thought Content: Thought content is  not paranoid.        Cognition and Memory: Memory is impaired.        Judgment: Judgment is not impulsive.     No results found.  Recent Results (from the past 2160 hours)  POC urinalysis dipstick     Status: None   Collection Time: 09/20/23  6:56 PM  Result Value Ref Range   Color, UA yellow yellow   Clarity, UA clear clear   Glucose, UA negative negative mg/dL   Bilirubin, UA negative negative   Ketones, POC UA negative negative mg/dL   Spec Grav, UA 8.979 8.989 - 1.025   Blood, UA negative negative   pH, UA 7.0 5.0 - 8.0   Protein Ur, POC negative negative mg/dL   Urobilinogen, UA 0.2 0.2 or 1.0 E.U./dL   Nitrite, UA Negative Negative   Leukocytes, UA Negative Negative        Beverley Adine Hummer, MD, MS

## 2023-10-15 NOTE — Patient Instructions (Signed)
 It was a pleasure to see you today! Thank you for choosing [facility] at [facility] for your primary care. Erin Roberson was seen for establishing care and referrals for dementia, cardiology, geriatrics, home health, audiology. Office will help schedule these appointments. Reach back out if you have not heard within 2 weeks for each referral.   Best,  Arvella Hummer, MD, MS 10/15/2023 1:56 PM

## 2023-10-15 NOTE — Assessment & Plan Note (Signed)
 Referral to geriatrics to help coordinate care comprehensively

## 2023-10-15 NOTE — Assessment & Plan Note (Signed)
 Patient experiences excessive sleep, sleeping up to 18 hours per day, leading to missed meals and decreased daytime functioning.   Plan: Keep a sleep diary to identify patterns and potential triggers. Assess during neurology consultation for underlying causes.

## 2023-10-15 NOTE — Assessment & Plan Note (Signed)
 Patient exhibits progressive memory loss, confusion, wandering behavior, repetitive actions, and vivid dreams suggestive of cognitive decline. Currently on combination donepezil /memantine   Differential Diagnosis:  Alzheimer's disease: Due to progressive memory loss and wandering episodes. Vascular dementia: Considered given cardiovascular history. Lewy body dementia: Possible due to vivid dreams and behavioral changes.  Pan:  Neurology Referral for comprehensive evaluation of cognitive function and memory concerns. Continue current memory medication (Namzaric ) as prescribed. Home Health referral to PT and HHA to implement strategies to prevent wandering, falls, including increased supervision. Advise on managing symptoms and ensuring patient safety. Monitor cognitive changes and report any acute deterioration. Follow up with neurology as scheduled.

## 2023-10-15 NOTE — Assessment & Plan Note (Signed)
 Patient is unable to manage daily activities independently, requiring increased care and supervision.  Plan:  Social Work Referral Home Health Services Consider Respite Care Options

## 2023-10-15 NOTE — Assessment & Plan Note (Signed)
 Chronic cough without shortness of breath.  Plan: Referral to pulmonology for further assessment

## 2023-10-17 ENCOUNTER — Telehealth: Payer: Self-pay | Admitting: *Deleted

## 2023-10-17 NOTE — Progress Notes (Signed)
Complex Care Management Note Care Guide Note  10/17/2023 Name: Erin Roberson MRN: [id number] DOB: 16-May-1924   Complex Care Management Outreach Attempts: An unsuccessful telephone outreach was attempted today to offer the patient information about available complex care management services.  Follow Up Plan:  Additional outreach attempts will be made to offer the patient complex care management information and services.   Encounter Outcome:  No Answer  [name]  [facility]  [facility], [facility] Guide  Direct Dial: [phone]  Fax [phone]

## 2023-10-18 NOTE — Progress Notes (Unsigned)
Complex Care Management Note Care Guide Note  10/18/2023 Name: Erin Roberson MRN: [id number] DOB: 1924-02-02   Complex Care Management Outreach Attempts: A second unsuccessful outreach was attempted today to offer the patient with information about available complex care management services.  Follow Up Plan:  Additional outreach attempts will be made to offer the patient complex care management information and services.   Encounter Outcome:  No Answer  [name]  [facility] Health  [facility], [facility] Guide  Direct Dial: [phone]  Fax [phone]

## 2023-10-21 NOTE — Progress Notes (Unsigned)
Complex Care Management Note Care Guide Note  10/21/2023 Name: Erin Roberson MRN: [id number] DOB: 10/18/1923   Complex Care Management Outreach Attempts: A third unsuccessful outreach was attempted today to offer the patient with information about available complex care management services.  Follow Up Plan:  Additional outreach attempts will be made to offer the patient complex care management information and services.   Encounter Outcome:  Patient Request to Call Back  [name]  [facility] Health  [facility], [facility] Guide  Direct Dial: [phone]  Fax [phone]

## 2023-10-22 NOTE — Progress Notes (Unsigned)
Complex Care Management Note Care Guide Note  10/22/2023 Name: CHRISTMAS STUTEVILLE MRN: [id number] DOB: [birth year]   Complex Care Management Outreach Attempts: An unsuccessful telephone outreach was attempted today to offer the patient information about available complex care management services.  Follow Up Plan:  Additional outreach attempts will be made to offer the patient complex care management information and services.   Encounter Outcome:  No Answer  [name]  [facility] Health  [facility], [facility], [facility] Guide  Direct Dial: [phone]  Fax [phone]

## 2023-10-24 ENCOUNTER — Encounter: Payer: Self-pay | Admitting: Physician Assistant

## 2023-10-24 ENCOUNTER — Telehealth: Payer: Self-pay | Admitting: Family Medicine

## 2023-10-24 ENCOUNTER — Telehealth: Payer: Self-pay | Admitting: *Deleted

## 2023-10-24 NOTE — Progress Notes (Signed)
Complex Care Management Note  Care Guide Note 10/24/2023 Name: STARLET BERGFELD MRN: [id number] DOB: [birth year]  [name] is a 89 y.o. year old female who sees Corine Shelter, MD for primary care. I reached out to [name] by phone today to offer complex care management services.  Ms. Mechanic daughter Zayne Pico DPR on file was given information about Complex Care Management services today including:   The Complex Care Management services include support from the care team which includes your Nurse Coordinator, Clinical Social Worker, or Pharmacist.  The Complex Care Management team is here to help remove barriers to the health concerns and goals most important to you. Complex Care Management services are voluntary, and the patient may decline or stop services at any time by request to their care team member.   Complex Care Management Consent Status: Patient daughter Allecia Koesters DPR on file agreed to services and verbal consent obtained.   Follow up plan:  Telephone appointment with complex care management team member scheduled for:  10/29/23 with SW and 2/3 with RNCM   Encounter Outcome:  Patient Scheduled  [name]  [facility] Health  [facility] Guide  Direct Dial: [phone]  Fax [phone]

## 2023-10-24 NOTE — Telephone Encounter (Signed)
Dr. Janee Morn,  Was the intent for pt to establish care with PCP specialized in geriatrics? I noticed you were not listed as PCP and want to confirm.    Copied from CRM [id number]. Topic: Referral - Request for Referral >> Oct 24, 2023  1:04 PM Isabell A wrote: Keondra Isidoro, daughter states the geriatric specialist the patient was referred to is a primary care organization and not specialist. [name] is requesting a new referral for a geriatric specialist. She also forgot to to request a referral for vision & headaches for the patient.

## 2023-10-24 NOTE — Progress Notes (Signed)
Complex Care Management Note Care Guide Note  10/24/2023 Name: Erin Roberson MRN: [id number] DOB: 10-05-1923   Complex Care Management Outreach Attempts: A third unsuccessful outreach was attempted today to offer the patient with information about available complex care management services.  Follow Up Plan:  No further outreach attempts will be made at this time. We have been unable to contact the patient to offer or enroll patient in complex care management services.  Encounter Outcome:  No Answer  [name]  [id number] Health  [facility], [facility] Guide  Direct Dial: [phone]  Fax [phone]

## 2023-10-25 NOTE — Telephone Encounter (Signed)
Called and spoke to [place]. She was confused about being moved to a new PCP. I did explain to her. She said she will call [facility] on Monday to get her scheduled. I  did let her know I was available for cb if she a had any further question or needed to be moved to another location. [name] verbalized understanding.

## 2023-10-28 ENCOUNTER — Ambulatory Visit: Payer: Medicare PPO | Admitting: Physician Assistant

## 2023-10-29 ENCOUNTER — Ambulatory Visit: Payer: Self-pay | Admitting: Licensed Clinical Social Worker

## 2023-10-29 NOTE — Patient Instructions (Signed)
Visit Information  Thank you for taking time to visit with me today. Please don't hesitate to contact me if I can be of assistance to you.   Following are the goals we discussed today:   Goals Addressed             This Visit's Progress    Increase access to community resources       Care Coordination Interventions: Assessed Social Determinants of Health Reviewed all upcoming appointments in Epic system Solution-Focused Strategies employed:  Active listening / Reflection utilized  Emotional Support Provided Referred to [employer] Discussed home camera options for security/fall risk Discussed private home aide options - will e-mail list of agencies in the area Discussed respite care process with assisted living facilities - will e-mail list of assisted living facilities in the area Reviewed referral placed by PCP at previous visit           Our next appointment is by telephone on 11/07/2023 at 11AM.  Please call the care guide team at [phone] if you need to cancel or reschedule your appointment.   If you are experiencing a Mental Health or Behavioral Health Crisis or need someone to talk to, please call the Suicide and Crisis Lifeline: 988  Patient verbalizes understanding of instructions and care plan provided today and agrees to view in MyChart. Active MyChart status and patient understanding of how to access instructions and care plan via MyChart confirmed with patient.     Telephone follow up appointment with care management team member scheduled for: 11/07/2023

## 2023-10-29 NOTE — Patient Outreach (Signed)
  Care Coordination   Initial Visit Note   10/29/2023 Name: ADALYN PENNOCK MRN: [id number] DOB: 03-29-1924  MALLEY HAUTER is a 89 y.o. year old female who sees Corine Shelter, MD for primary care. I spoke with  [name] by phone today.  What matters to the patients health and wellness today?  Increasing access to community resources.    Goals Addressed             This Visit's Progress    Increase access to community resources       Care Coordination Interventions: Assessed Social Determinants of Health Reviewed all upcoming appointments in Epic system Solution-Focused Strategies employed:  Active listening / Reflection utilized  Emotional Support Provided Referred to [employer] Discussed home camera options for security/fall risk Discussed private home aide options - will e-mail list of agencies in the area Discussed respite care process with assisted living facilities - will e-mail list of assisted living facilities in the area Reviewed referral placed by PCP at previous visit           SDOH assessments and interventions completed:  Yes  SDOH Interventions Today    Flowsheet Row Most Recent Value  SDOH Interventions   Food Insecurity Interventions Intervention Not Indicated  Housing Interventions Intervention Not Indicated  Transportation Interventions Intervention Not Indicated  Utilities Interventions Intervention Not Indicated        Care Coordination Interventions:  Yes, provided   Interventions Today    Flowsheet Row Most Recent Value  Chronic Disease   Chronic disease during today's visit Other  [Dementia]  General Interventions   General Interventions Discussed/Reviewed [employer], General Interventions Discussed  [Dtr interested in hiring private in-home aides - CSW will -email list of agencies in the area. Dtr also interested in socialization opportunities and transportation- CSW reviewed senior centers in the  area and transportation options. Reviewed respite]  Education Interventions   Education Provided Provided Education  [Dtr stated she has a trip coming up at the end of February and she is interested in respite services for this - CSW discussed respite process and will e-mail list of facilties for possible respite. Also discussed establishing additional help w/home aide]  Safety Interventions   Safety Discussed/Reviewed Fall Risk, Safety Discussed, Home Safety  [Pt's dtr was previously staying with pt 2 days and then going home for 2 days - now staying with pt 24/7 since she became confused. Discussed fall safety and has referral for home health. Also discussed cameras in home.]        Follow up plan: Follow up call scheduled for 11/07/2023    Encounter Outcome:  Patient Visit Completed   Kenton Kingfisher, LCSW [place], [facility] Health Licensed Clinical Social Worker Care Coordinator [phone]

## 2023-10-30 ENCOUNTER — Telehealth: Payer: Self-pay | Admitting: *Deleted

## 2023-10-30 NOTE — Progress Notes (Signed)
Complex Care Management Care Guide Note  10/30/2023 Name: Erin Roberson MRN: [id number] DOB: 1924-07-06  Erin Roberson is a 89 y.o. year old female who is a primary care patient of Garnette Gunner, MD and is actively engaged with the care management team. I reached out to [name] by phone today to assist with re-scheduling  with the RN Case Manager.  Follow up plan: Telephone appointment with complex care management team member scheduled for:  11/06/23  [name]  [facility] Health  [facility], [facility] Guide  Direct Dial: [phone]  Fax [phone]

## 2023-10-30 NOTE — Telephone Encounter (Signed)
Copied from CRM [id number]. Topic: Clinical - Medication Question >> Oct 30, 2023  1:59 PM Suzette B wrote: Reason for CRM: Patient's daughter [name] called in stating patient has been exposed to COVID she states patient has been tested with the home test and is positive she needs to know can she get a cough medication for the patient or the Paxlovid. She has requested a call back to see what she should do in this case. [phone]  Spoke to patient daughter regarding COVID symptoms and she tested positive with home kit. I did advise patient daughter that a in person office visit is needed to assist symptoms. Patient was scheduled for 10/31/2023 at 3:20 pm and verbalized understanding; a referral for Ophthalmology was also requested.

## 2023-10-30 NOTE — Telephone Encounter (Signed)
Copied from CRM [id number]. Topic: Referral - Question >> Oct 30, 2023  2:05 PM Erin Roberson wrote: Reason for CRM: Patient's daughter Erin Roberson has rquestd a referral for her mother to have vision checked. [phone]

## 2023-10-30 NOTE — Progress Notes (Signed)
Complex Care Management Care Guide Note  10/30/2023 Name: Erin Roberson MRN: [id number] DOB: 04/03/1924  Erin Roberson is a 89 y.o. year old female who is a primary care patient of Corine Shelter, MD and is actively engaged with the care management team. I reached out to [name] by phone today to assist with re-scheduling  with the RN Case Manager.  Follow up plan: Unsuccessful telephone outreach attempt made. A HIPAA compliant phone message was left for the patient providing contact information and requesting a return call.  [name]  [facility] Health  [facility], [facility] Guide  Direct Dial: [phone]  Fax [phone]

## 2023-10-31 ENCOUNTER — Encounter: Payer: Self-pay | Admitting: Family Medicine

## 2023-10-31 ENCOUNTER — Ambulatory Visit (INDEPENDENT_AMBULATORY_CARE_PROVIDER_SITE_OTHER): Payer: Medicare PPO | Admitting: Family Medicine

## 2023-10-31 ENCOUNTER — Telehealth: Payer: Self-pay

## 2023-10-31 VITALS — BP 106/72 | HR 77 | Temp 97.5°F | Wt 127.0 lb

## 2023-10-31 DIAGNOSIS — N1832 Chronic kidney disease, stage 3b: Secondary | ICD-10-CM | POA: Diagnosis not present

## 2023-10-31 DIAGNOSIS — U071 COVID-19: Secondary | ICD-10-CM

## 2023-10-31 LAB — POC COVID19 BINAXNOW: SARS Coronavirus 2 Ag: POSITIVE — AB

## 2023-10-31 MED ORDER — CORICIDIN D COLD/FLU/SINUS 2-5-325 MG PO TABS: ORAL_TABLET | ORAL | Status: DC

## 2023-10-31 MED ORDER — DM-GUAIFENESIN ER 30-600 MG PO TB12: ORAL_TABLET | ORAL | Status: DC

## 2023-10-31 MED ORDER — NIRMATRELVIR/RITONAVIR (PAXLOVID) TABLET (RENAL DOSING): 2.0000 | ORAL_TABLET | Freq: Two times a day (BID) | ORAL | 0 refills | Status: AC

## 2023-10-31 NOTE — Patient Instructions (Signed)
VISIT SUMMARY:  Today, we discussed your recent COVID-19 diagnosis and your symptoms of cough, congestion, and fatigue. We also reviewed your history of pulmonary embolism and aortic regurgitation, and discussed your increased risk of complications from COVID-19 due to these conditions.  YOUR PLAN:  -COVID-19: COVID-19 is a viral infection that can cause respiratory symptoms and other complications. Given your age and medical history, you are at a higher risk for severe outcomes. We have prescribed a reduced dose of Paxlovid to help manage the infection. For your cough, you can use over-the-counter Chloraseptic or Mucinex DM, and Tylenol can help with any chills. It's important to rest, stay hydrated, and monitor for any worsening symptoms such as chest pain, shortness of breath, or decreased alertness. Please wear a mask while you are symptomatic to prevent spreading the virus to others.  -AORTIC REGURGITATION: Aortic regurgitation is a condition where the heart's aortic valve does not close tightly, which can cause blood to flow backward into the heart. There are no new symptoms to address today, so please continue with your current management and monitoring plan.  -PULMONARY EMBOLISM: Pulmonary embolism is a condition where one or more arteries in the lungs become blocked by a blood clot. There are no new symptoms to address today, so please continue with your current management and monitoring plan.  -GENERAL HEALTH MAINTENANCE: You have not received a COVID-19 vaccine booster since your initial series and first booster. We recommend getting the latest COVID-19 booster to help protect you from severe illness. Please continue to follow general health guidelines, including a balanced diet, regular exercise as tolerated, and routine check-ups.  INSTRUCTIONS:  Please return to the clinic if your symptoms worsen or do not improve. Follow up as needed for any new or worsening symptoms.

## 2023-10-31 NOTE — Telephone Encounter (Signed)
Erin Roberson that we can swab [facility] for covid when she arrives at her 3:20pm appt. She verbalized understanding.

## 2023-10-31 NOTE — Telephone Encounter (Signed)
Copied from CRM [id number]. Topic: General - Other >> Oct 31, 2023 11:55 AM Fredrich Romans wrote: Reason for CRM: patient is scheduled for an office appointment today at 320.Her daughter wants to makes sure she is going to have an official covid test in office.She stated that they only did a home test.

## 2023-10-31 NOTE — Progress Notes (Signed)
Assessment/Plan:        COVID-[age] year old with pulmonary embolism and aortic regurgitation, CKD3b, diagnosed with COVID-19. Symptoms: cough, congestion, fatigue for four days. No chest pain, shortness of breath, wheezing, fever, or chills. Lungs clear on auscultation. Increased risk of severe outcomes due to age and comorbidities. Discussed Paxlovid benefits and reduced dose due to declined kidney function. Recommended over-the-counter Coricidin or Mucinex DM for cough. Emphasized rest, hydration, and monitoring for worsening symptoms. - Prescribe reduced dose Paxlovid - Recommend Chloraseptic or Mucinex DM for cough - Recommend Tylenol for chills - Advise rest and hydration - Monitor for chest pain, shortness of breath, or decreased arousability - Recommend mask-wearing while symptomatic  Follow-up - Return to clinic if symptoms worsen or do not improve - Follow-up as needed for new or worsening symptoms.       There are no discontinued medications.  No follow-ups on file.    Subjective:   Encounter date: 10/31/2023  Erin Roberson is a 89 y.o. female who has HYPERCHOLESTEROLEMIA; LEUKOPENIA, MILD; DEPRESSION; Coronary atherosclerosis; PULMONARY EMBOLISM; Orthostatic dizziness; UNSTEADY GAIT; CHEST PAIN, ATYPICAL; HEMATURIA, HX OF; Long term current use of anticoagulant; Constipation, chronic; Diverticulosis of colon; Breast lesion, Left, 12 o'clock.; Chronic LLQ pain; Dementia (HCC); Encounter for therapeutic drug monitoring; Chronic anticoagulation - Coumadin for history of PE; Near syncope; Bradycardia; Coronary artery disease involving native coronary artery of native heart without angina pectoris; Hyperlipidemia LDL goal <70; Lightheadedness; Cough; Aortic valve regurgitation; Age-related cognitive decline; Wandering; Falls; Sensorineural hearing loss (SNHL) of both ears; Frailty syndrome in geriatric patient; Somnolence; and Needs assistance while at home on their problem  list..   She  has a past medical history of Arthritis, Breast mass, Clotting disorder (HCC), Coronary atherosclerosis, Dementia (HCC), Depression, Diverticulosis of colon (without mention of hemorrhage), Fibromyalgia, Glaucoma, Hemorrhage of rectum and anus, History of uterine cancer, Iron deficiency anemia, unspecified, Leukopenia, Other pulmonary embolism and infarction, Pure hypercholesterolemia, Thyroid disease, and UTI (lower urinary tract infection)..   She presents with chief complaint of Covid Exposure (Daughter tested positive for covid. Would like a covid test. Tested pos at home. Congestion, cough, fatigue x 4 days) .   Discussed the use of AI scribe software for clinical note transcription with the patient, who gave verbal consent to proceed.  History of Present Illness   The patient is a 89 year old female with pulmonary embolism and aortic regurgitation who presents with COVID-19 infection. She is accompanied by her daughter, [name].  She was diagnosed with COVID-19 at a walk-in clinic and confirmed the diagnosis with a home test. She has been experiencing symptoms of cough, congestion, and fatigue for the past four days. The cough is particularly bothersome at night, occurring every three minutes, and is accompanied by thick congestion. No chest pain, shortness of breath, wheezing, fevers, chills, or nasal congestion. She is not currently taking any medications for her COVID-19 symptoms and came to the clinic to seek further treatment options.  She has a history of pulmonary embolism and aortic regurgitation, which increases her risk of complications from COVID-19. She has not had any previous COVID-19 infections.  Her last COVID-19 vaccination was the initial series and a booster, received a couple of years ago, but she has not received the Omicron-specific booster.  She does not go out much, but her daughter drives her when necessary.       Review of Systems  All other  systems reviewed and are negative.   Past Surgical History:  Procedure Laterality Date   ABDOMINAL HYSTERECTOMY     CARDIAC CATHETERIZATION  07/25/10   luminal irregularities in multiple vessels, no sig stenosis, EF 60%   CATARACT EXTRACTION  2004   right   COLONOSCOPY  07/01/2007   diverticulosis   CORONARY ANGIOPLASTY  1992   LAD   KIDNEY SURGERY     stent placed   NECK SURGERY     thyroid needle aspiration  12/2007   right, hyperplastic nodule   UPPER GASTROINTESTINAL ENDOSCOPY  07/16/2007   tortous esophagus with spasms    Outpatient Medications Prior to Visit  Medication Sig Dispense Refill   aspirin EC 81 MG tablet Take 1 tablet (81 mg total) by mouth daily.     midodrine (PROAMATINE) 5 MG tablet Take 15 mg by mouth daily.     NAMZARIC 7-10 MG CP24 Take 1 capsule by mouth daily.  3   simvastatin (ZOCOR) 20 MG tablet 20 mg.     No facility-administered medications prior to visit.    Family History  Problem Relation Age of Onset   Diabetes Mother        deceased age 16   Hypertension Mother    Pancreatic cancer Father        deceased age 67   Emphysema Brother        deceased   Alcohol abuse Brother    Osteoporosis Other    Stroke Maternal Aunt    Colon cancer Neg Hx    Heart attack Neg Hx     Social History   Socioeconomic History   Marital status: Divorced    Spouse name: Not on file   Number of children: Not on file   Years of education: Not on file   Highest education level: Not on file  Occupational History    Employer: RETIRED  Tobacco Use   Smoking status: Never   Smokeless tobacco: Never  Vaping Use   Vaping status: Never Used  Substance and Sexual Activity   Alcohol use: No   Drug use: No   Sexual activity: Not on file  Other Topics Concern   Not on file  Social History Narrative   Not on file   Social Drivers of Health   Financial Resource Strain: Not on file  Food Insecurity: No Food Insecurity (10/29/2023)   Hunger Vital Sign     Worried About Running Out of Food in the Last Year: Never true    Ran Out of Food in the Last Year: Never true  Transportation Needs: No Transportation Needs (10/29/2023)   PRAPARE - Administrator, Civil Service (Medical): No    Lack of Transportation (Non-Medical): No  Physical Activity: Not on file  Stress: Not on file  Social Connections: Not on file  Intimate Partner Violence: Not At Risk (10/29/2023)   Humiliation, Afraid, Rape, and Kick questionnaire    Fear of Current or Ex-Partner: No    Emotionally Abused: No    Physically Abused: No    Sexually Abused: No  Objective:  Physical Exam: BP 106/72   Pulse 77   Temp (!) 97.5 F (36.4 C) (Oral)   Wt 127 lb (57.6 kg)   SpO2 97%   BMI 21.97 kg/m     Physical Exam Constitutional:      General: She is not in acute distress.    Appearance: Normal appearance. She is not ill-appearing or toxic-appearing.  HENT:     Head: Normocephalic and atraumatic.     Nose: Nose normal. No congestion.  Eyes:     General: No scleral icterus.    Extraocular Movements: Extraocular movements intact.  Cardiovascular:     Rate and Rhythm: Normal rate and regular rhythm.     Pulses: Normal pulses.     Heart sounds: Normal heart sounds.  Pulmonary:     Effort: Pulmonary effort is normal. No respiratory distress.     Breath sounds: Normal breath sounds.  Abdominal:     General: Abdomen is flat. Bowel sounds are normal.     Palpations: Abdomen is soft.  Musculoskeletal:        General: Normal range of motion.  Lymphadenopathy:     Cervical: No cervical adenopathy.  Skin:    General: Skin is warm and dry.     Findings: No rash.  Neurological:     General: No focal deficit present.     Mental Status: She is alert. Mental status is at baseline.  Psychiatric:        Mood and Affect: Affect is not inappropriate.         Cognition and Memory: Memory is impaired (Chronic dementia).    No results found.  Recent Results (from the past 2160 hours)  POC urinalysis dipstick     Status: None   Collection Time: 09/20/23  6:56 PM  Result Value Ref Range   Color, UA yellow yellow   Clarity, UA clear clear   Glucose, UA negative negative mg/dL   Bilirubin, UA negative negative   Ketones, POC UA negative negative mg/dL   Spec Grav, UA 1.610 9.604 - 1.025   Blood, UA negative negative   pH, UA 7.0 5.0 - 8.0   Protein Ur, POC negative negative mg/dL   Urobilinogen, UA 0.2 0.2 or 1.0 E.U./dL   Nitrite, UA Negative Negative   Leukocytes, UA Negative Negative  POC COVID-19 Bin[employer]Now     Status: Abnormal   Collection Time: 10/31/23  3:32 PM  Result Value Ref Range   SARS Coronavirus 2 Ag Positive (A) Negative        Garner Nash, MD, MS

## 2023-11-01 ENCOUNTER — Encounter: Payer: Self-pay | Admitting: Physician Assistant

## 2023-11-01 ENCOUNTER — Ambulatory Visit: Payer: Medicare PPO | Attending: Physician Assistant | Admitting: Physician Assistant

## 2023-11-01 VITALS — BP 92/46 | HR 68 | Wt 128.2 lb

## 2023-11-01 DIAGNOSIS — I351 Nonrheumatic aortic (valve) insufficiency: Secondary | ICD-10-CM

## 2023-11-01 DIAGNOSIS — U071 COVID-19: Secondary | ICD-10-CM

## 2023-11-01 DIAGNOSIS — R42 Dizziness and giddiness: Secondary | ICD-10-CM

## 2023-11-01 DIAGNOSIS — E785 Hyperlipidemia, unspecified: Secondary | ICD-10-CM | POA: Diagnosis not present

## 2023-11-01 DIAGNOSIS — I2510 Atherosclerotic heart disease of native coronary artery without angina pectoris: Secondary | ICD-10-CM

## 2023-11-01 DIAGNOSIS — R55 Syncope and collapse: Secondary | ICD-10-CM

## 2023-11-01 DIAGNOSIS — I951 Orthostatic hypotension: Secondary | ICD-10-CM

## 2023-11-01 NOTE — Patient Instructions (Signed)
Medication Instructions:  Your Physician recommend you continue on your current medication as directed.    *If you need a refill on your cardiac medications before your next appointment, please call your pharmacy*   Lab Work:   Follow-Up: At [facility], you and your health needs are our priority.  As part of our continuing mission to provide you with exceptional heart care, we have created designated Provider Care Teams.  These Care Teams include your primary Cardiologist (physician) and Advanced Practice Providers (APPs -  Physician Assistants and Nurse Practitioners) who all work together to provide you with the care you need, when you need it.  We recommend signing up for the patient portal called "MyChart".  Sign up information is provided on this After Visit Summary.  MyChart is used to connect with patients for Virtual Visits (Telemedicine).  Patients are able to view lab/test results, encounter notes, upcoming appointments, etc.  Non-urgent messages can be sent to your provider as well.   To learn more about what you can do with MyChart, go to [email or web address].    Your next appointment:   6 month(s)  Provider:   You may see Yvonne Kendall, MD or one of the following Advanced Practice Providers on your designated Care Team:   Nicolasa Ducking, NP Eula Listen, PA-C Cadence Fransico Michael, PA-C Charlsie Quest, NP Carlos Levering, NP

## 2023-11-01 NOTE — Progress Notes (Signed)
Cardiology Office Note    Date:  11/01/2023   ID:  Erin Roberson, DOB 19-May-1924, MRN [id number]  PCP:  Garnette Gunner, MD  Cardiologist:  Yvonne Kendall, MD  Electrophysiologist:  None   Chief Complaint: Follow-up  History of Present Illness:   Erin Roberson is a 89 y.o. female with history of CAD status post PTCA to the LAD in 1992, mild aortic regurgitation, unprovoked PE treated with Coumadin, HLD, uterine cancer, dementia, fibromyalgia, and atypical chest pain who presents for follow-up of CAD.   Most recent ischemic evaluation in 12/2015 showed no evidence of ischemia and was overall low risk with a normal LV systolic function.  Echo during admission for syncope and 11/2016 demonstrated an EF of 60 to 65%, no regional wall motion abnormalities, grade 1 diastolic dysfunction, trileaflet aortic valve with mildly calcified leaflets and mild regurgitation.  Carotid artery ultrasound at that time showed 1 to 39% bilateral ICA stenosis with antegrade flow of the bilateral vertebral arteries.  She has a history of lightheadedness and near syncope/syncope that have previously been attributed to dehydration with recommendation to increase fluid intake.  She was seen in the office in 12/2021 with ongoing dizziness/lightheadedness that had been present for years.  It was not much different than when she was last evaluated.  She also noted headaches.  She was spending a lot of her time sleeping, at times up to 16 to 18 hours/day in bed and not drinking much water.  Her appetite was also diminished.  She had suffered a fall approximately 1 month prior to her visit.  She was without symptoms of angina or decompensation.  The only medication she was taking at that time was Namzaric and OTC pain medication for headache.  It was felt her lightheadedness/orthostatic hypotension were multifactorial including poor fluid intake and some degree of malnutrition.  There was also concern that possible adverse effect  from Namzaric may be playing a role.  She was encouraged to stay well-hydrated, liberalize salt intake, and discuss Namzaric with her PCP.  Echo on 01/26/2022 demonstrated an EF of 60 to 65%, no regional wall motion abnormalities, grade 1 diastolic dysfunction, normal RV systolic function and ventricular cavity size, mildly elevated PASP, mildly dilated left atrium, trivial mitral regurgitation, mild aortic regurgitation, and an estimated right atrial pressure of 3 mmHg.  She was last seen in the office in 03/2022 with patient feeling her dizziness was improved when compared to her prior visit.  Patient's daughter felt like the dizziness was largely unchanged.  Dizziness was felt to be multifactorial including poor fluid intake and some degree of malnutrition with inability to exclude pharmacotherapy as well.  Zio patch was recommended and is not available for review at this time.   She tested positive for COVID on 10/31/2023 and is currently on Paxlovid.  With regards to COVID vaccine, the patient received her initial vaccine and 1 booster.  She comes in accompanied by her daughter today who provides the history.  Patient's daughter indicates the patient has been without symptoms of angina or cardiac decompensation.  She reports the patient has mentioned to episodes of upper left-sided chest discomfort since she was last seen in 2023 that were split-second lasting and self resolved.  Chronic dizziness is stable.  No presyncope or syncope.  She reports the patient will not eat unless there is someone there to cook food and eat with her.  If there is someone to be with her, the patient's daughter  reports the patient has a good appetite.  Her daughter feels like the patient is not drinking enough water as well.  No lower extremity swelling or progressive orthopnea.  The patient's daughter also reports it has been challenging at times to get the patient to take medications.   Labs independently reviewed: 12/2021 -  Hgb 12.3, PLT 212, potassium 4.2, BUN 26, serum creatinine 1.17, albumin 3.3, AST/ALT normal, TC 229, TG 85, HDL 64, LDL 148, TSH normal   Past Medical History:  Diagnosis Date   Arthritis    Breast mass    left breast   Clotting disorder (HCC)    Coronary atherosclerosis    a. s/p PCTA LAD (1992). b. Last cath in 10/11 with nonobstructive disease, EF 60% by LV-gram.    Dementia (HCC)    Depression    Diverticulosis of colon (without mention of hemorrhage)    Fibromyalgia    Glaucoma    Hemorrhage of rectum and anus    History of uterine cancer    Iron deficiency anemia, unspecified    Leukopenia    chronic--benign   Other pulmonary embolism and infarction    2011   Pure hypercholesterolemia    Thyroid disease    UTI (lower urinary tract infection)     Past Surgical History:  Procedure Laterality Date   ABDOMINAL HYSTERECTOMY     CARDIAC CATHETERIZATION  07/25/10   luminal irregularities in multiple vessels, no sig stenosis, EF 60%   CATARACT EXTRACTION  2004   right   COLONOSCOPY  07/01/2007   diverticulosis   CORONARY ANGIOPLASTY  1992   LAD   KIDNEY SURGERY     stent placed   NECK SURGERY     thyroid needle aspiration  12/2007   right, hyperplastic nodule   UPPER GASTROINTESTINAL ENDOSCOPY  07/16/2007   tortous esophagus with spasms    Current Medications: Current Meds  Medication Sig   aspirin EC 81 MG tablet Take 1 tablet (81 mg total) by mouth daily.   Chlorphen-Phenyleph-APAP (CORICIDIN D COLD/FLU/SINUS) 2-5-325 MG TABS As directed on package   dextromethorphan-guaiFENesin (MUCINEX DM) 30-600 MG 12hr tablet As directed on package   NAMZARIC 7-10 MG CP24 Take 1 capsule by mouth daily.   nirmatrelvir/ritonavir, renal dosing, (PAXLOVID) 10 x 150 MG & 10 x 100MG  TABS Take 2 tablets by mouth 2 (two) times daily for 5 days. (Take nirmatrelvir 150 mg one tablet twice daily for 5 days and ritonavir 100 mg one tablet twice daily for 5 days) Patient GFR is 43    simvastatin (ZOCOR) 20 MG tablet 20 mg.    Allergies:   Patient has no known allergies.   Social History   Socioeconomic History   Marital status: Divorced    Spouse name: Not on file   Number of children: Not on file   Years of education: Not on file   Highest education level: Not on file  Occupational History    Employer: RETIRED  Tobacco Use   Smoking status: Never   Smokeless tobacco: Never  Vaping Use   Vaping status: Never Used  Substance and Sexual Activity   Alcohol use: No   Drug use: No   Sexual activity: Not on file  Other Topics Concern   Not on file  Social History Narrative   Not on file   Social Drivers of Health   Financial Resource Strain: Not on file  Food Insecurity: No Food Insecurity (10/29/2023)   Hunger Vital Sign  Worried About Programme researcher, broadcasting/film/video in the Last Year: Never true    Ran Out of Food in the Last Year: Never true  Transportation Needs: No Transportation Needs (10/29/2023)   PRAPARE - Administrator, Civil Service (Medical): No    Lack of Transportation (Non-Medical): No  Physical Activity: Not on file  Stress: Not on file  Social Connections: Not on file     Family History:  The patient's family history includes Alcohol abuse in her brother; Diabetes in her mother; Emphysema in her brother; Hypertension in her mother; Osteoporosis in an other family member; Pancreatic cancer in her father; Stroke in her maternal aunt. There is no history of Colon cancer or Heart attack.  ROS:   12-point review of systems is negative unless otherwise noted in the HPI.   EKGs/Labs/Other Studies Reviewed:    Studies reviewed were summarized above. The additional studies were reviewed today:  Zio patch 03/2022: Not available for review __________  2D echo 01/26/2022:  1. Left ventricular ejection fraction, by estimation, is 60 to 65%. The  left ventricle has normal function. The left ventricle has no regional  wall motion  abnormalities. Left ventricular diastolic parameters are  consistent with Grade I diastolic  dysfunction (impaired relaxation).   2. Right ventricular systolic function is normal. The right ventricular  size is normal. There is mildly elevated pulmonary artery systolic  pressure.   3. Left atrial size was mildly dilated.   4. The mitral valve is normal in structure. Trivial mitral valve  regurgitation. No evidence of mitral stenosis.   5. The aortic valve is tricuspid. Aortic valve regurgitation is mild. No  aortic stenosis is present. Aortic regurgitation PHT measures 511 msec.   6. The inferior vena cava is normal in size with greater than 50%  respiratory variability, suggesting right atrial pressure of 3 mmHg. __________   2D echo 11/11/2016: - Left ventricle: The cavity size was normal. Systolic function was    normal. The estimated ejection fraction was in the range of 60%    to 65%. Wall motion was normal; there were no regional wall    motion abnormalities. Doppler parameters are consistent with    abnormal left ventricular relaxation (grade 1 diastolic    dysfunction).  - Aortic valve: Trileaflet; mildly thickened, mildly calcified    leaflets. There was mild regurgitation.  - Left atrium: The atrium was at the upper limits of normal in    size. Volume/bsa, ES, (1-plane Simpson&'s, A2C): 33 ml/m^2. __________   Eugenie Birks MPI 01/17/2016: Nuclear stress EF: 59%. No wall motion abnormalities There was no ST segment deviation noted during stress. This is a low risk study. Defect 1: There is a small defect of mild severity present in the apex location. No ischemia   EKG:  EKG is ordered today.  The EKG ordered today demonstrates NSR, 64 bpm, inferolateral T wave inversion consistent with prior tracings  Recent Labs: No results found for requested labs within last 365 days.  Recent Lipid Panel    Component Value Date/Time   CHOL 229 (H) 01/17/2022 1754   CHOL 202 (H)  10/30/2018 1511   TRIG 85 01/17/2022 1754   HDL 64 01/17/2022 1754   HDL 60 10/30/2018 1511   CHOLHDL 3.6 01/17/2022 1754   VLDL 17 01/17/2022 1754   LDLCALC 148 (H) 01/17/2022 1754   LDLCALC 117 (H) 10/30/2018 1511   LDLDIRECT 177 (H) 05/15/2018 1636   LDLDIRECT 138.4 02/13/2013 1307  PHYSICAL EXAM:    VS:  BP (!) 92/46 (BP Location: Left Arm, Patient Position: Supine)   Pulse 68   Wt 128 lb 3.2 oz (58.2 kg)   SpO2 94%   BMI 22.18 kg/m   BMI: Body mass index is 22.18 kg/m.  Physical Exam Vitals reviewed.  Constitutional:      Appearance: She is well-developed.  HENT:     Head: Normocephalic and atraumatic.  Eyes:     General:        Right eye: No discharge.        Left eye: No discharge.  Neck:     Vascular: No JVD.  Cardiovascular:     Rate and Rhythm: Normal rate and regular rhythm.     Pulses:          Posterior tibial pulses are 2+ on the right side and 2+ on the left side.     Heart sounds: Normal heart sounds, S1 normal and S2 normal. Heart sounds not distant. No midsystolic click and no opening snap. No murmur heard.    No friction rub.  Pulmonary:     Effort: Pulmonary effort is normal. No respiratory distress.     Breath sounds: Normal breath sounds. No decreased breath sounds, wheezing, rhonchi or rales.  Chest:     Chest wall: No tenderness.  Abdominal:     General: There is no distension.  Musculoskeletal:     Cervical back: Normal range of motion.     Right lower leg: No edema.     Left lower leg: No edema.  Skin:    General: Skin is warm and dry.     Nails: There is no clubbing.  Neurological:     Mental Status: She is alert and oriented to person, place, and time.  Psychiatric:        Speech: Speech normal.        Behavior: Behavior normal.        Thought Content: Thought content normal.        Judgment: Judgment normal.     Wt Readings from Last 3 Encounters:  11/01/23 128 lb 3.2 oz (58.2 kg)  10/31/23 127 lb (57.6 kg)  10/15/23  128 lb 6.4 oz (58.2 kg)     ASSESSMENT & PLAN:   History of lightheadedness with orthostatic hypotension/dizziness/presyncope: Longstanding issue, and stable.  Felt to be multifactorial including poor fluid intake and some degree of malnutrition.  Remains on midodrine 5 mg 3 times daily, though at times her daughter finds it difficult to give the patient this medication.  Prior Zio patch not completed.  In the setting of her acute COVID illness, and overall stable symptoms, this will be deferred at this time.  Recommend adequate hydration and good oral intake.  Coronary artery calcification/HLD: Without symptoms concerning for angina or cardiac decompensation.  Remains on aspirin.  No recent labs available for review.  In the setting of her recently diagnosed acute COVID illness (10/31/2023) labs were deferred at this time in an effort to minimize exposure to clinical staff, and to have these drawn when she is back to baseline.  Aortic regurgitation: Stable and mild on echo in 2023.  Unlikely to be contributory to her longstanding dizziness.  COVID: On Paxlovid.  Supportive care.  Ongoing management per PCP.  ED precautions.    Disposition: F/u with Dr. Okey Dupre or an APP in 6 months.   Medication Adjustments/Labs and Tests Ordered: Current medicines are reviewed at length with the  patient today.  Concerns regarding medicines are outlined above. Medication changes, Labs and Tests ordered today are summarized above and listed in the Patient Instructions accessible in Encounters.   Signed, Eula Listen, PA-C 11/01/2023 4:17 PM     [facility] [place] [address] [place], [place] [address] [phone]

## 2023-11-04 ENCOUNTER — Encounter (HOSPITAL_COMMUNITY): Payer: Self-pay | Admitting: Emergency Medicine

## 2023-11-04 ENCOUNTER — Other Ambulatory Visit: Payer: Self-pay

## 2023-11-04 ENCOUNTER — Emergency Department (HOSPITAL_COMMUNITY)
Admission: EM | Admit: 2023-11-04 | Discharge: 2023-11-05 | Disposition: A | Discharge status: Home / Self Care | Payer: Medicare PPO | Attending: Emergency Medicine | Admitting: Emergency Medicine

## 2023-11-04 ENCOUNTER — Emergency Department (HOSPITAL_COMMUNITY): Payer: Medicare PPO

## 2023-11-04 DIAGNOSIS — Z7982 Long term (current) use of aspirin: Secondary | ICD-10-CM | POA: Insufficient documentation

## 2023-11-04 DIAGNOSIS — F0390 Unspecified dementia without behavioral disturbance: Secondary | ICD-10-CM | POA: Insufficient documentation

## 2023-11-04 DIAGNOSIS — R55 Syncope and collapse: Secondary | ICD-10-CM | POA: Diagnosis present

## 2023-11-04 LAB — URINALYSIS, ROUTINE W REFLEX MICROSCOPIC
Bilirubin Urine: NEGATIVE
Glucose, UA: NEGATIVE mg/dL
Hgb urine dipstick: NEGATIVE
Ketones, ur: NEGATIVE mg/dL
Leukocytes,Ua: NEGATIVE
Nitrite: NEGATIVE
Protein, ur: NEGATIVE mg/dL
Specific Gravity, Urine: 1.012 (ref 1.005–1.030)
pH: 7 (ref 5.0–8.0)

## 2023-11-04 LAB — BASIC METABOLIC PANEL
Anion gap: 10 (ref 5–15)
BUN: 17 mg/dL (ref 8–23)
CO2: 24 mmol/L (ref 22–32)
Calcium: 8.7 mg/dL — ABNORMAL LOW (ref 8.9–10.3)
Chloride: 105 mmol/L (ref 98–111)
Creatinine, Ser: 1.41 mg/dL — ABNORMAL HIGH (ref 0.44–1.00)
GFR, Estimated: 34 mL/min — ABNORMAL LOW (ref >60)
Glucose, Bld: 144 mg/dL — ABNORMAL HIGH (ref 70–99)
Potassium: 4.5 mmol/L (ref 3.5–5.1)
Sodium: 139 mmol/L (ref 135–145)

## 2023-11-04 LAB — CBC
HCT: 40.2 % (ref 36.0–46.0)
Hemoglobin: 13.1 g/dL (ref 12.0–15.0)
MCH: 31.4 pg (ref 26.0–34.0)
MCHC: 32.6 g/dL (ref 30.0–36.0)
MCV: 96.4 fL (ref 80.0–100.0)
Platelets: 196 10*3/uL (ref 150–400)
RBC: 4.17 MIL/uL (ref 3.87–5.11)
RDW: 12.5 % (ref 11.5–15.5)
WBC: 3.7 10*3/uL — ABNORMAL LOW (ref 4.0–10.5)
nRBC: 0 % (ref 0.0–0.2)

## 2023-11-04 LAB — TROPONIN I (HIGH SENSITIVITY)
Troponin I (High Sensitivity): 10 ng/L (ref <18)
Troponin I (High Sensitivity): 11 ng/L (ref <18)

## 2023-11-04 LAB — CBG MONITORING, ED: Glucose-Capillary: 153 mg/dL — ABNORMAL HIGH (ref 70–99)

## 2023-11-04 MED ORDER — SODIUM CHLORIDE 0.9 % IV BOLUS
500.0000 mL | Freq: Once | INTRAVENOUS | Status: AC
  Administered 2023-11-04: 500 mL via INTRAVENOUS

## 2023-11-04 NOTE — Discharge Instructions (Addendum)
It was a pleasure taking part in your care.  As discussed, your workup is reassuring.  Please continue hydrating self at home.  Please continue taking Paxlovid as directed.  Please follow-up with your PCP.  If you begin to experience any new symptoms, please return to the ED.

## 2023-11-04 NOTE — ED Triage Notes (Signed)
Patient brought in by [facility] from home for syncopal episode. Family caught patient during syncopal episode. Patient reports feeling weak since dx with COVID on 1/29. Hx of dementia. 20G LAC.   BP 102/62, HR 62, Spo2 98% RA

## 2023-11-04 NOTE — ED Provider Notes (Signed)
[place] EMERGENCY DEPARTMENT AT [facility] Provider Note   CSN: [id number] Arrival date & time: 11/04/23  1959     History  Chief Complaint  Patient presents with   Loss of Consciousness    Erin Roberson is a 89 y.o. female with medical history of thyroid disease, leukopenia, iron deficiency anemia, glaucoma, fibromyalgia, depression, dementia.  Patient presents to ED for evaluation of syncope.  Patient daughter at bedside provides history.  Per patient daughter, the patient had an episode of syncope earlier today.  Apparently the patient has COVID-19 and was diagnosed on Wednesday.  She has been taking Paxlovid since diagnosis.  Per patient daughter, the patient's appetite has been decreased secondary to feeling ill but the patient denies abdominal pain.  Apparently the patient had 1 episode of nausea and vomiting yesterday but denies any diarrhea.  Per patient daughter, the patient had gotten out of the car today and was walking up the stairs when she began to feel very weak and then fell to the side.  Patient daughter reports that she caught the mom and lowered her to the ground.  Patient daughter states that the patient's p.o. intake has been poor since being diagnosed with COVID.  Patient denies any chest pain, shortness of breath, lightheadedness or dizziness at this time.  Denies any nausea or vomiting, abdominal pain, diarrhea.  Denies any fevers at home.  Is endorsing a cough.   Loss of Consciousness      Home Medications Prior to Admission medications   Medication Sig Start Date End Date Taking? Authorizing Provider  aspirin EC 81 MG tablet Take 1 tablet (81 mg total) by mouth daily. 01/17/22   End, Cristal Deer, MD  Chlorphen-Phenyleph-APAP (CORICIDIN D COLD/FLU/SINUS) 2-5-325 MG TABS As directed on package 10/31/23   Garnette Gunner, MD  dextromethorphan-guaiFENesin [facility] DM) 30-600 MG 12hr tablet As directed on package 10/31/23   Garnette Gunner, MD   midodrine (PROAMATINE) 5 MG tablet Take 15 mg by mouth daily. Patient not taking: Reported on 11/01/2023 08/29/21   [provider]  NAMZARIC 7-10 MG CP24 Take 1 capsule by mouth daily. 10/06/16   [provider]  nirmatrelvir/ritonavir, renal dosing, (PAXLOVID) 10 x 150 MG & 10 x 100MG  TABS Take 2 tablets by mouth 2 (two) times daily for 5 days. (Take nirmatrelvir 150 mg one tablet twice daily for 5 days and ritonavir 100 mg one tablet twice daily for 5 days) Patient GFR is 43 10/31/23 11/05/23  Garnette Gunner, MD  simvastatin (ZOCOR) 20 MG tablet 20 mg. 05/16/23   [provider]      Allergies    Patient has no known allergies.    Review of Systems   Review of Systems  Unable to perform ROS: Dementia (Level 5 caveat)  Cardiovascular:  Positive for syncope.  All other systems reviewed and are negative.   Physical Exam Updated Vital Signs BP (!) 109/50 (BP Location: Right Arm)   Pulse 60   Temp 97.6 F (36.4 C) (Oral)   Resp 15   Ht 5\' 3"  (1.6 m)   Wt 58.2 kg   SpO2 98%   BMI 22.73 kg/m  Physical Exam Vitals and nursing note reviewed.  Constitutional:      General: She is not in acute distress.    Appearance: Normal appearance. She is not ill-appearing, toxic-appearing or diaphoretic.  HENT:     Head: Normocephalic and atraumatic.     Nose: Nose normal.  Mouth/Throat:     Mouth: Mucous membranes are moist.     Pharynx: Oropharynx is clear.  Eyes:     Extraocular Movements: Extraocular movements intact.     Conjunctiva/sclera: Conjunctivae normal.     Pupils: Pupils are equal, round, and reactive to light.  Cardiovascular:     Rate and Rhythm: Normal rate and regular rhythm.  Pulmonary:     Effort: Pulmonary effort is normal.     Breath sounds: Normal breath sounds. No wheezing.  Abdominal:     General: Abdomen is flat. Bowel sounds are normal.     Palpations: Abdomen is soft.     Tenderness: There is no abdominal tenderness.   Musculoskeletal:     Cervical back: Normal range of motion and neck supple. No tenderness.     Right lower leg: No edema.     Left lower leg: No edema.  Skin:    General: Skin is warm and dry.     Capillary Refill: Capillary refill takes less than 2 seconds.  Neurological:     Mental Status: She is alert and oriented to person, place, and time.     ED Results / Procedures / Treatments   Labs (all labs ordered are listed, but only abnormal results are displayed) Labs Reviewed  BASIC METABOLIC PANEL - Abnormal; Notable for the following components:      Result Value   Glucose, Bld 144 (*)    Creatinine, Ser 1.41 (*)    Calcium 8.7 (*)    GFR, Estimated 34 (*)    All other components within normal limits  CBC - Abnormal; Notable for the following components:   WBC 3.7 (*)    All other components within normal limits  CBG MONITORING, ED - Abnormal; Notable for the following components:   Glucose-Capillary 153 (*)    All other components within normal limits  URINALYSIS, ROUTINE W REFLEX MICROSCOPIC  TROPONIN I (HIGH SENSITIVITY)  TROPONIN I (HIGH SENSITIVITY)    EKG EKG Interpretation Date/Time:  Monday November 04 2023 20:07:03 EST Ventricular Rate:  62 PR Interval:  128 QRS Duration:  74 QT Interval:  400 QTC Calculation: 406 R Axis:   -17  Text Interpretation: Normal sinus rhythm ST & T wave abnormality, consider inferolateral ischemia Abnormal ECG T wave inversions in the inferior leads not new Confirmed by Ernie Avena ([id number]) on 11/04/2023 8:16:24 PM  Radiology DG Chest 2 View Result Date: 11/04/2023 CLINICAL DATA:  Syncope EXAM: CHEST - 2 VIEW COMPARISON:  10/26/2020 FINDINGS: No acute airspace disease or pleural effusion. Stable cardiomediastinal silhouette with aortic atherosclerosis. No pneumothorax. Safety pin artifact at the thoracic inlet IMPRESSION: No active cardiopulmonary disease. Electronically Signed   By: Jasmine Pang M.D.   On: 11/04/2023 21:08     Procedures Procedures    Medications Ordered in ED Medications  sodium chloride 0.9 % bolus 500 mL (0 mLs Intravenous Stopped 11/04/23 2302)    ED Course/ Medical Decision Making/ A&P  Medical Decision Making Amount and/or Complexity of Data Reviewed Labs: ordered.   89 year old female presents for evaluation.  Please see HPI for further details.  On exam patient is afebrile and nontachycardic.  Her lung sounds are clear bilaterally, she is not hypoxic.  Abdomen is soft and compressible throughout.  Neurological examinations at baseline.  Patient CBC without leukocytosis or anemia.  Metabolic panel with increased creatinine 1.4.  Patient last known baseline was 1 year ago, 1.17.  Patient was provided 500 mL of fluid.  Her troponin was 10, delta 11.  She denies any chest pain.  Her EKG is nonischemic.  Chest x-ray shows no consolidations or effusions.  Urinalysis unremarkable.  On reassessment, the patient reports he feels better.  Her and her daughter at the bedside were offered admission for observation overnight however they are deferring on this at this time.  Strict return precautions were given and the patient daughter voiced understanding.  The patient daughter was advised to have the patient seen by her PCP for follow-up.  Patient daughter advised to return to the ED with her mother if any change in mental status or return of symptoms.  Patient daughter voices understanding.  Stable to discharge home.   Final Clinical Impression(s) / ED Diagnoses Final diagnoses:  Near syncope    Rx / DC Orders ED Discharge Orders     None         Clent Ridges 11/04/23 2334    Wynetta Fines, MD 11/05/23 [phone]

## 2023-11-04 NOTE — ED Provider Triage Note (Addendum)
Emergency Medicine Provider Triage Evaluation Note  Erin Roberson , a 89 y.o. female  was evaluated in triage.  Pt complains of syncope.  The patient is COVID positive as of 1/29, has a history of dementia and history is limited by dementia.  Had a syncopal patient during which the family caught the patient.  Patient denies any complaints at this time.  Arrives GCS 14, neuro intact  Review of Systems  Positive: Limited by dementia Negative: Emina by dementia  Physical Exam  BP (!) 109/50 (BP Location: Right Arm)   Pulse 60   Temp 97.6 F (36.4 C) (Oral)   Resp 15   Ht 5\' 3"  (1.6 m)   Wt 58.2 kg   SpO2 98%   BMI 22.73 kg/m  Gen:   Awake, no distress, GCS 14 Resp:  Normal effort, lungs CTAB Neuro:  No CN deficit, follows simple commands, effort dependent but demonstrated symmetric 4/5 strength in all four extremities MSK:   Moves extremities without difficulty  Abd:  Soft, nontender, nondistended, no rebound or guarding  Medical Decision Making  Medically screening exam initiated at 8:12 PM.  Appropriate orders placed.  [name] was informed that the remainder of the evaluation will be completed by another provider, this initial triage assessment does not replace that evaluation, and the importance of remaining in the ED until their evaluation is complete.  Syncope workup initiated, EKG, chest x-ray, screening labs, orthostatics.  Patient not cleared for the lobby due to her age, presentation of syncope, need for continuous monitoring on cardiac telemetry.   Ernie Avena, MD 11/04/23 2014    Ernie Avena, MD 11/04/23 Elisha Ponder

## 2023-11-06 ENCOUNTER — Ambulatory Visit: Payer: Self-pay

## 2023-11-06 NOTE — Patient Instructions (Signed)
 Visit Information  Thank you for taking time to visit with me today. Please don't hesitate to contact me if I can be of assistance to you.   Following are the goals we discussed today:   Goals Addressed             This Visit's Progress    Establishing with new PCP-Dementia       Care Coordination Interventions: Evaluation of current treatment plan related to Dementia and patient's adherence to plan as established by provider Reviewed medications with patient and discussed medication administration with daughter.   Discussed plans with patient for ongoing care management follow up and provided patient with direct contact information for care management team  Spoke with daughter who states patient is doing better.  Recent COVID and on Paxlovid ,  Discussed COVID and masking.  Patient has some dementia but able to function pretty good.  Daughter [name] who is primary caregiver,  is working to get more caregivers for her mom and possible respite care.  She is also working to get established with [employer]. She states she will be calling to set up an appointment.  She also asked about an eye doctor.  Discussed [facility] and [facility].  She will contact them.  She states that mom needs help with ADL's and right now she is helping doing a sponge bath as her shower is not working.  Discussed Aging Gracefully program.  Contact information given for program.  Encouraged her to call.  She is agreeable to RN CM follow up.           Our next appointment is by telephone on 11/28/23 at 100 pm  Please call the care guide team at [phone] if you need to cancel or reschedule your appointment.   If you are experiencing a Mental Health or Behavioral Health Crisis or need someone to talk to, please call the Suicide and Crisis Lifeline: 988   Patient verbalizes understanding of instructions and care plan provided today and agrees to view in MyChart. Active MyChart status and patient  understanding of how to access instructions and care plan via MyChart confirmed with patient.     The patient has been provided with contact information for the care management team and has been advised to call with any health related questions or concerns.   Yeriel Mineo J. Leighton Luster RN, MSN [facility], [facility] RN Care Manager Direct Dial: [phone]  Fax: [phone] Website: [email or web address]

## 2023-11-06 NOTE — Patient Outreach (Signed)
  Care Coordination   Initial Visit Note   11/06/2023 Name: Erin Roberson MRN: [id number] DOB: 10-15-1923  Erin Roberson is a 89 y.o. year old female who sees Sebastian Beverley NOVAK, MD for primary care. I  spoke with daughter [name] by phone today.    What matters to the patients health and wellness today?  Getting established with Gerontology.     Goals Addressed             This Visit's Progress    Establishing with new PCP-Dementia       Care Coordination Interventions: Evaluation of current treatment plan related to Dementia and patient's adherence to plan as established by provider Reviewed medications with patient and discussed medication administration with daughter.   Discussed plans with patient for ongoing care management follow up and provided patient with direct contact information for care management team  Spoke with daughter who states patient is doing better.  Recent COVID and on Paxlovid ,  Discussed COVID and masking.  Patient has some dementia but able to function pretty good.  Daughter [name] who is primary caregiver,  is working to get more caregivers for her mom and possible respite care.  She is also working to get established with [employer]. She states she will be calling to set up an appointment.  She also asked about an eye doctor.  Discussed [facility] and [facility].  She will contact them.  She states that mom needs help with ADL's and right now she is helping doing a sponge bath as her shower is not working.  Discussed Aging Gracefully program.  Contact information given for program.  Encouraged her to call.  She is agreeable to RN CM follow up.           SDOH assessments and interventions completed:  Yes  SDOH Interventions Today    Flowsheet Row Most Recent Value  SDOH Interventions   Food Insecurity Interventions Intervention Not Indicated  Housing Interventions Intervention Not Indicated  Transportation Interventions Intervention Not  Indicated, Patient Resources (Friends/Family)  Utilities Interventions Intervention Not Indicated        Care Coordination Interventions:  Yes, provided   Follow up plan: Follow up call scheduled for 11/28/23    Encounter Outcome:  Patient Visit Completed   Dorsie Burich J. Aubreyanna Dorrough RN, MSN [place]  [facility], [facility] RN Care Manager Direct Dial: [phone]  Fax: [phone] Website: [email or web address]

## 2023-11-07 ENCOUNTER — Ambulatory Visit: Payer: Self-pay | Admitting: Licensed Clinical Social Worker

## 2023-11-07 NOTE — Patient Instructions (Signed)
 Visit Information  Thank you for taking time to visit with me today. Please don't hesitate to contact me if I can be of assistance to you.   Following are the goals we discussed today:   Goals Addressed             This Visit's Progress    Increase access to community resources       Care Coordination Interventions: Assessed Social Determinants of Health Reviewed all upcoming appointments in Epic system Solution-Focused Strategies employed:  Active listening / Reflection utilized  Emotional Support Provided Referred to [employer] Discussed home camera options for security/fall risk Discussed private home aide options - will e-mail list of agencies in the area Discussed respite care process with assisted living facilities - will e-mail list of assisted living facilities in the area - CSW assisted with contact of ALFs Reviewed referral placed by PCP at previous visit           Our next appointment is by telephone on 11/21/2023.  Please call the care guide team at [phone] if you need to cancel or reschedule your appointment.   If you are experiencing a Mental Health or Behavioral Health Crisis or need someone to talk to, please call the Suicide and Crisis Lifeline: 988  Patient verbalizes understanding of instructions and care plan provided today and agrees to view in MyChart. Active MyChart status and patient understanding of how to access instructions and care plan via MyChart confirmed with patient.     Telephone follow up appointment with care management team member scheduled for: 11/21/2023

## 2023-11-07 NOTE — Patient Outreach (Signed)
  Care Coordination   Follow Up Visit Note   11/07/2023 Name: Erin Roberson MRN: [id number] DOB: December 04, 1923  Erin Roberson is a 89 y.o. year old female who sees Sebastian Beverley NOVAK, MD for primary care. I spoke with  Ronal LOISE Lesches by phone today.  What matters to the patients health and wellness today?  Increasing access to community resources.    Goals Addressed             This Visit's Progress    Increase access to community resources       Care Coordination Interventions: Assessed Social Determinants of Health Reviewed all upcoming appointments in Epic system Solution-Focused Strategies employed:  Active listening / Reflection utilized  Emotional Support Provided Referred to [employer] Discussed home camera options for security/fall risk Discussed private home aide options - will e-mail list of agencies in the area Discussed respite care process with assisted living facilities - will e-mail list of assisted living facilities in the area - CSW assisted with contact of ALFs Reviewed referral placed by PCP at previous visit           SDOH assessments and interventions completed:  Yes     Care Coordination Interventions:  Yes, provided   Interventions Today    Flowsheet Row Most Recent Value  Chronic Disease   Chronic disease during today's visit Other  [Dementia]  General Interventions   General Interventions Discussed/Reviewed [employer]  [CSW resent dtr list of home aid agencies and ALFs. Dtr is still interested in respite care at the end of Nov, CSW encouraged her to reach out ALFs soon. CSW agreed to assist with contact ALFs.]  Education Interventions   Education Provided Provided Education  [Dtr plans on contacting senior healthcare provider today to set up an appt.]        Follow up plan: Follow up call scheduled for 11/21/2023    Encounter Outcome:  Patient Visit Completed   Alm Armor, LCSW [place], [facility] Licensed Clinical Social Worker Care Coordinator [phone]

## 2023-11-13 NOTE — Progress Notes (Signed)
Assessment/Plan:     Erin Roberson is a very pleasant 89 y.o. year old RH female with a history of hypotension on midodrine, hyperlipidemia, remote history of PE on chronic baby aspirin, history of bradycardia with syncopal episode in February 2025, CAD, hearing loss, COVID on 10/31/2023, depression, hard of hearing seen today for evaluation of memory loss. MoCA today is .  Patient has been on Namzaric 7 -10 mg q 24 h since 2018.  Memory decline is noted today.  Given her advanced age and performance today, there is no indication to continue or increase this antidementia medication  as the risk of it outweigh the benefits.   Dementia likely vascular and Alzheimer's etiology  MRI brain without contrast to assess for underlying structural abnormality and assess vascular load  Check B12, TSH At this moment, no antidepressant medication is recommended, side effects outweigh the benefits of it. Recommend good control of cardiovascular risk factors.   Continue to control mood as per PCP Follow-up pending on the results of the MRI of the brain  Subjective:    The patient is accompanied by ***  who supplements the history.    How long did patient have memory difficulties?  For about.  Patient reports some difficulty remembering new information, recent conversations, names. repeats oneself?  Endorsed Disoriented when walking into a room?  Patient denies ***  Leaving objects in unusual places?  denies   Wandering behavior? denies   Any personality changes, or depression, anxiety? denies*** Hallucinations or paranoia? denies   Seizures? denies    Any sleep changes?  Sleeps well ***/does not sleep well. ***vivid dreams, REM behavior or sleepwalking   Sleep apnea? Denies.   Any hygiene concerns?  Denies.   Independent of bathing and dressing? Endorsed  Does the patient need help with medications?  is in charge *** Who is in charge of the finances?  is in charge   *** Any changes in appetite?    Denies. ***   Patient have trouble swallowing?  Denies.   Does the patient cook? No*** Any headaches?  Denies.   Chronic pain? Denies.   Ambulates with difficulty? Denies ***  Needs a cane*** Needs a walker *** to ambulate for stability.   Recent falls or head injuries?  Is frail, and has a tendency to fall. Vision changes?  Denies any new issues.  Has a history of*** Any strokelike symptoms? Denies.   Any tremors? Denies. *** Any anosmia? Denies.   Any incontinence of urine? Denies.   Any bowel dysfunction? Denies.      Patient lives with ***  History of heavy alcohol intake? Denies.   History of heavy tobacco use? Denies.   Family history of dementia?   *** with dementia  Does patient drive? No ***  No Known Allergies  Current Outpatient Medications  Medication Instructions   aspirin EC 81 mg, Oral, Daily   Chlorphen-Phenyleph-APAP (CORICIDIN D COLD/FLU/SINUS) 2-5-325 MG TABS As directed on package   dextromethorphan-guaiFENesin (MUCINEX DM) 30-600 MG 12hr tablet As directed on package   midodrine (PROAMATINE) 15 mg, Daily   NAMZARIC 7-10 MG CP24 1 capsule, Daily   simvastatin (ZOCOR) 20 mg     VITALS:  There were no vitals filed for this visit.    PHYSICAL EXAM   HEENT:  Normocephalic, atraumatic.  The superficial temporal arteries are without ropiness or tenderness. Cardiovascular: Regular rate and rhythm. Lungs: Clear to auscultation bilaterally. Neck: There are no carotid bruits noted bilaterally.  NEUROLOGICAL:     No data to display              No data to display           Orientation:  Alert and oriented to person, place and not to time***. No aphasia or dysarthria. Fund of knowledge is appropriate. Recent and remote memory impaired.  Attention and concentration are reduced***.  Able to name objects and repeat phrases /5 ***. Delayed recall  / *** Cranial nerves: There is good facial symmetry. Extraocular muscles are intact and visual fields are  full to confrontational testing. Speech is fluent and clear. No tongue deviation. Hearing is intact to conversational tone.*** Tone: Tone is good throughout. Sensation: Sensation is intact to light touch.  Vibration is intact at the bilateral big toe.  Coordination: The patient has no difficulty with RAM's or FNF bilaterally. Normal finger to nose  Motor: Strength is 5/5 in the bilateral upper and lower extremities. There is no pronator drift. There are no fasciculations noted. DTR's: Deep tendon reflexes are 2/4 bilaterally. Gait and Station: The patient is able to ambulate without difficulty. Gait is cautious and narrow. Stride length is normal ***      Thank you for allowing [place] the opportunity to participate in the care of this nice patient. Please do not hesitate to contact us for any questions or concerns.   Total time spent on today's visit was *** minutes dedicated to this patient today, preparing to see patient, examining the patient, ordering tests and/or medications and counseling the patient, documenting clinical information in the EHR or other health record, independently interpreting results and communicating results to the patient/family, discussing treatment and goals, answering patient's questions and coordinating care.  Cc:  Garnette Gunner, MD  Marlowe Kays 11/13/2023 7:25 AM

## 2023-11-15 ENCOUNTER — Ambulatory Visit: Payer: Medicare PPO | Admitting: Physician Assistant

## 2023-11-15 ENCOUNTER — Ambulatory Visit: Payer: Medicare PPO

## 2023-11-15 ENCOUNTER — Encounter: Payer: Self-pay | Admitting: Physician Assistant

## 2023-11-15 VITALS — BP 118/57 | HR 64 | Resp 20 | Ht 63.75 in | Wt 128.0 lb

## 2023-11-15 DIAGNOSIS — F0280 Dementia in other diseases classified elsewhere without behavioral disturbance: Secondary | ICD-10-CM | POA: Diagnosis not present

## 2023-11-15 DIAGNOSIS — G309 Alzheimer's disease, unspecified: Secondary | ICD-10-CM | POA: Diagnosis not present

## 2023-11-15 DIAGNOSIS — R413 Other amnesia: Secondary | ICD-10-CM

## 2023-11-15 NOTE — Patient Instructions (Signed)
It was a pleasure to see you today at our office.   Recommendations:   MRI of the brain, the radiology office will call you to arrange you appointment  Check labs today  Follow up in  months Recommend visiting the website : " Dementia Success Path" to better understand some behaviors related to memory loss.    For psychiatric meds, mood meds: Please have your primary care physician manage these medications.  If you have any severe symptoms of a stroke, or other severe issues such as confusion,severe chills or fever, etc call 911 or go to the ER as you may need to be evaluated further   For assessment of decision of mental capacity and competency:  Call Dr. Erick Blinks, geriatric psychiatrist at [phone]  Counseling regarding caregiver distress, including caregiver depression, anxiety and issues regarding community resources, adult day care programs, adult living facilities, or memory care questions:  please contact your  Primary Doctor's Social Worker   Whom to call: Memory  decline, memory medications: Call our office [phone]    [email or web address]   RECOMMENDATIONS FOR ALL PATIENTS WITH MEMORY PROBLEMS: 1. Continue to exercise (Recommend 30 minutes of walking everyday, or 3 hours every week) 2. Increase social interactions - continue going to [place] and enjoy social gatherings with friends and family 3. Eat healthy, avoid fried foods and eat more fruits and vegetables 4. Maintain adequate blood pressure, blood sugar, and blood cholesterol level. Reducing the risk of stroke and cardiovascular disease also helps promoting better memory. 5. Avoid stressful situations. Live a simple life and avoid aggravations. Organize your time and prepare for the next day in anticipation. 6. Sleep well, avoid any interruptions of sleep and avoid any distractions in the bedroom that may interfere with adequate sleep quality 7. Avoid  sugar, avoid sweets as there is a strong link between excessive sugar intake, diabetes, and cognitive impairment We discussed the Mediterranean diet, which has been shown to help patients reduce the risk of progressive memory disorders and reduces cardiovascular risk. This includes eating fish, eat fruits and green leafy vegetables, nuts like almonds and hazelnuts, walnuts, and also use olive oil. Avoid fast foods and fried foods as much as possible. Avoid sweets and sugar as sugar use has been linked to worsening of memory function.  There is always a concern of gradual progression of memory problems. If this is the case, then we may need to adjust level of care according to patient needs. Support, both to the patient and caregiver, should then be put into place.        FALL PRECAUTIONS: Be cautious when walking. Scan the area for obstacles that may increase the risk of trips and falls. When getting up in the mornings, sit up at the edge of the bed for a few minutes before getting out of bed. Consider elevating the bed at the head end to avoid drop of blood pressure when getting up. Walk always in a well-lit room (use night lights in the walls). Avoid area rugs or power cords from appliances in the middle of the walkways. Use a walker or a cane if necessary and consider physical therapy for balance exercise. Get your eyesight checked regularly.  FINANCIAL OVERSIGHT: Supervision, especially oversight when making financial decisions or transactions is also recommended.  HOME SAFETY: Consider the safety of the kitchen when operating appliances like stoves, microwave oven, and blender. Consider having supervision and share cooking responsibilities until no longer able to participate in those. Accidents with  firearms and other hazards in the house should be identified and addressed as well.   ABILITY TO BE LEFT ALONE: If patient is unable to contact 911 operator, consider using LifeLine, or when the need  is there, arrange for someone to stay with patients. Smoking is a fire hazard, consider supervision or cessation. Risk of wandering should be assessed by caregiver and if detected at any point, supervision and safe proof recommendations should be instituted.  MEDICATION SUPERVISION: Inability to self-administer medication needs to be constantly addressed. Implement a mechanism to ensure safe administration of the medications.      Mediterranean Diet A Mediterranean diet refers to food and lifestyle choices that are based on the traditions of countries located on the [employer]. This way of eating has been shown to help prevent certain conditions and improve outcomes for people who have chronic diseases, like kidney disease and heart disease. What are tips for following this plan? Lifestyle  Cook and eat meals together with your family, when possible. Drink enough fluid to keep your urine clear or pale yellow. Be physically active every day. This includes: Aerobic exercise like running or swimming. Leisure activities like gardening, walking, or housework. Get 7-8 hours of sleep each night. If recommended by your health care provider, drink red wine in moderation. This means 1 glass a day for nonpregnant women and 2 glasses a day for men. A glass of wine equals 5 oz (150 mL). Reading food labels  Check the serving size of packaged foods. For foods such as rice and pasta, the serving size refers to the amount of cooked product, not dry. Check the total fat in packaged foods. Avoid foods that have saturated fat or trans fats. Check the ingredients list for added sugars, such as corn syrup. Shopping  At the grocery store, buy most of your food from the areas near the walls of the store. This includes: Fresh fruits and vegetables (produce). Grains, beans, nuts, and seeds. Some of these may be available in unpackaged forms or large amounts (in bulk). Fresh seafood. Poultry and  eggs. Low-fat dairy products. Buy whole ingredients instead of prepackaged foods. Buy fresh fruits and vegetables in-season from local farmers markets. Buy frozen fruits and vegetables in resealable bags. If you do not have access to quality fresh seafood, buy precooked frozen shrimp or canned fish, such as tuna, salmon, or sardines. Buy small amounts of raw or cooked vegetables, salads, or olives from the deli or salad bar at your store. Stock your pantry so you always have certain foods on hand, such as olive oil, canned tuna, canned tomatoes, rice, pasta, and beans. Cooking  Cook foods with extra-virgin olive oil instead of using butter or other vegetable oils. Have meat as a side dish, and have vegetables or grains as your main dish. This means having meat in small portions or adding small amounts of meat to foods like pasta or stew. Use beans or vegetables instead of meat in common dishes like chili or lasagna. Experiment with different cooking methods. Try roasting or broiling vegetables instead of steaming or sauteing them. Add frozen vegetables to soups, stews, pasta, or rice. Add nuts or seeds for added healthy fat at each meal. You can add these to yogurt, salads, or vegetable dishes. Marinate fish or vegetables using olive oil, lemon juice, garlic, and fresh herbs. Meal planning  Plan to eat 1 vegetarian meal one day each week. Try to work up to 2 vegetarian meals, if possible. Eat seafood  2 or more times a week. Have healthy snacks readily available, such as: Vegetable sticks with hummus. Greek yogurt. Fruit and nut trail mix. Eat balanced meals throughout the week. This includes: Fruit: 2-3 servings a day Vegetables: 4-5 servings a day Low-fat dairy: 2 servings a day Fish, poultry, or lean meat: 1 serving a day Beans and legumes: 2 or more servings a week Nuts and seeds: 1-2 servings a day Whole grains: 6-8 servings a day Extra-virgin olive oil: 3-4 servings a day Limit  red meat and sweets to only a few servings a month What are my food choices? Mediterranean diet Recommended Grains: Whole-grain pasta. Brown rice. Bulgar wheat. Polenta. Couscous. Whole-wheat bread. Orpah Cobb. Vegetables: Artichokes. Beets. Broccoli. Cabbage. Carrots. Eggplant. Green beans. Chard. Kale. Spinach. Onions. Leeks. Peas. Squash. Tomatoes. Peppers. Radishes. Fruits: Apples. Apricots. Avocado. Berries. Bananas. Cherries. Dates. Figs. Grapes. Lemons. Melon. Oranges. Peaches. Plums. Pomegranate. Meats and other protein foods: Beans. Almonds. Sunflower seeds. Pine nuts. Peanuts. Cod. Salmon. Scallops. Shrimp. Tuna. Tilapia. Clams. Oysters. Eggs. Dairy: Low-fat milk. Cheese. Greek yogurt. Beverages: Water. Red wine. Herbal tea. Fats and oils: Extra virgin olive oil. Avocado oil. Grape seed oil. Sweets and desserts: [place] yogurt with honey. Baked apples. Poached pears. Trail mix. Seasoning and other foods: Basil. Cilantro. Coriander. Cumin. Mint. Parsley. Sage. Rosemary. Tarragon. Garlic. Oregano. Thyme. Pepper. Balsalmic vinegar. Tahini. Hummus. Tomato sauce. Olives. Mushrooms. Limit these Grains: Prepackaged pasta or rice dishes. Prepackaged cereal with added sugar. Vegetables: Deep fried potatoes (french fries). Fruits: Fruit canned in syrup. Meats and other protein foods: Beef. Pork. Lamb. Poultry with skin. Hot dogs. Tomasa Blase. Dairy: Ice cream. Sour cream. Whole milk. Beverages: Juice. Sugar-sweetened soft drinks. Beer. Liquor and spirits. Fats and oils: Butter. Canola oil. Vegetable oil. Beef fat (tallow). Lard. Sweets and desserts: Cookies. Cakes. Pies. Candy. Seasoning and other foods: Mayonnaise. Premade sauces and marinades. The items listed may not be a complete list. Talk with your dietitian about what dietary choices are right for you. Summary The Mediterranean diet includes both food and lifestyle choices. Eat a variety of fresh fruits and vegetables, beans, nuts,  seeds, and whole grains. Limit the amount of red meat and sweets that you eat. Talk with your health care provider about whether it is safe for you to drink red wine in moderation. This means 1 glass a day for nonpregnant women and 2 glasses a day for men. A glass of wine equals 5 oz (150 mL). This information is not intended to replace advice given to you by your health care provider. Make sure you discuss any questions you have with your health care provider. Document Released: 05/10/2016 Document Revised: 06/12/2016 Document Reviewed: 05/10/2016 Elsevier Interactive Patient Education  2017 [employer].

## 2023-11-21 ENCOUNTER — Ambulatory Visit: Payer: Self-pay | Admitting: Licensed Clinical Social Worker

## 2023-11-21 NOTE — Patient Outreach (Signed)
Care Coordination   11/21/2023 Name: Erin Roberson MRN: [id number] DOB: December 08, 1923   Care Coordination Outreach Attempts:  An unsuccessful telephone outreach was attempted today to offer the patient information about available complex care management services.  Follow Up Plan:  Additional outreach attempts will be made to offer the patient complex care management information and services.   Encounter Outcome:  No Answer   Care Coordination Interventions:  No, not indicated    Kenton Kingfisher, LCSW [facility], [facility] Licensed Clinical Social Worker Care Coordinator [phone]

## 2023-11-28 ENCOUNTER — Telehealth: Payer: Self-pay

## 2023-11-28 NOTE — Telephone Encounter (Signed)
 Erin Roberson with [facility] and she states she has been trying to get in contact with pt for PT to come out since 2/10 and she just heard back from the patient's daughter today for scheduling. First visit will be on 12/05/2022.

## 2023-11-28 NOTE — Telephone Encounter (Signed)
 Copied from CRM [id number]. Topic: Clinical - Home Health Verbal Orders >> Nov 27, 2023  4:50 PM Myrtice Lauth wrote: Caller[facility]Agency: [facility]  Callback Number: [phone] Service Requested: Occupational Therapy, Physical Therapy, and home health, SW Frequency: called to inform provider of SoC date pt is march 6,  Any new concerns about the patient? No

## 2023-11-29 ENCOUNTER — Encounter: Payer: Self-pay | Admitting: Physician Assistant

## 2023-12-03 ENCOUNTER — Telehealth: Payer: Self-pay | Admitting: Audiologist

## 2023-12-03 NOTE — Telephone Encounter (Signed)
 Records show Erin Roberson already has hearing aids with [facility]. Recommend all follow up care be with hearing aid provider. [facility] does NOT work with / fix / or sell hearing aids.

## 2023-12-04 ENCOUNTER — Ambulatory Visit: Payer: Medicare PPO | Admitting: Audiologist

## 2023-12-04 ENCOUNTER — Ambulatory Visit: Payer: Self-pay

## 2023-12-04 NOTE — Patient Instructions (Signed)
 Visit Information  Thank you for taking time to visit with me today. Please don't hesitate to contact me if I can be of assistance to you.   Following are the goals we discussed today:   Goals Addressed             This Visit's Progress    Establishing with new PCP-Dementia       Care Coordination Interventions: Evaluation of current treatment plan related to Dementia and patient's adherence to plan as established by provider Reviewed medications with patient and discussed medication administration with daughter.   Discussed plans with patient for ongoing care management follow up and provided patient with direct contact information for care management team  Spoke with daughter [name] who is the primary caregiver for patient.  She states patient is doing pretty good and hanging in there.  Patient and daughter went out of town for a funeral with help of extended family.  She reports patient memory is really not good so she tries to take pictures and videos for patient.  Discussed dementia and progression.  She is working on respite care for patient as she needs to go out of town in April.  She has called [facility] program and waiting for return call.  Patient has appointment with [facility] to establish care on 12-11-23.  Home Health has been ordered.  [facility] to call today to set up time to come out.          Your next appointment is by telephone on 01/08/24 at 200 pm  Please call the care guide team at [phone] if you need to cancel or reschedule your appointment.   If you are experiencing a Mental Health or Behavioral Health Crisis or need someone to talk to, please call the Suicide and Crisis Lifeline: 988   Patient verbalizes understanding of instructions and care plan provided today and agrees to view in MyChart. Active MyChart status and patient understanding of how to access instructions and care plan via MyChart confirmed with patient.     The patient has  been provided with contact information for the care management team and has been advised to call with any health related questions or concerns.   Bary Leriche RN, MSN [facility], [facility] RN Care Manager Direct Dial: [phone]  Fax: [phone] Website: [email or web address]

## 2023-12-04 NOTE — Patient Outreach (Signed)
 Care Coordination   Follow Up Visit Note   12/04/2023 Name: Erin Roberson MRN: [id number] DOB: 1924/09/15  Erin Roberson is a 89 y.o. year old female who sees Garnette Gunner, MD for primary care. I  spoke with daughter [name] today by phone.  What matters to the patients health and wellness today?  Getting through appointments and getting respite set up.      Goals Addressed             This Visit's Progress    Establishing with new PCP-Dementia       Care Coordination Interventions: Evaluation of current treatment plan related to Dementia and patient's adherence to plan as established by provider Reviewed medications with patient and discussed medication administration with daughter.   Discussed plans with patient for ongoing care management follow up and provided patient with direct contact information for care management team  Spoke with daughter [name] who is the primary caregiver for patient.  She states patient is doing pretty good and hanging in there.  Patient and daughter went out of town for a funeral with help of extended family.  She reports patient memory is really not good so she tries to take pictures and videos for patient.  Discussed dementia and progression.  She is working on respite care for patient as she needs to go out of town in April.  She has called [facility] program and waiting for return call.  Patient has appointment with [facility] to establish care on 12-11-23.  Home Health has been ordered.  [facility] to call today to set up time to come out.          SDOH assessments and interventions completed:  Yes     Care Coordination Interventions:  Yes, provided   Follow up plan: Follow up call scheduled for 01/08/24    Encounter Outcome:  Patient Visit Completed   Bary Leriche RN, MSN [place]  [facility], [facility] RN Care Manager Direct Dial: [phone]  Fax: [phone] Website: [email or web address]

## 2023-12-05 DIAGNOSIS — Z9183 Wandering in diseases classified elsewhere: Secondary | ICD-10-CM | POA: Diagnosis not present

## 2023-12-05 DIAGNOSIS — G8929 Other chronic pain: Secondary | ICD-10-CM | POA: Diagnosis not present

## 2023-12-05 DIAGNOSIS — F03918 Unspecified dementia, unspecified severity, with other behavioral disturbance: Secondary | ICD-10-CM | POA: Diagnosis not present

## 2023-12-05 DIAGNOSIS — F0393 Unspecified dementia, unspecified severity, with mood disturbance: Secondary | ICD-10-CM | POA: Diagnosis not present

## 2023-12-05 DIAGNOSIS — M797 Fibromyalgia: Secondary | ICD-10-CM | POA: Diagnosis not present

## 2023-12-05 DIAGNOSIS — I10 Essential (primary) hypertension: Secondary | ICD-10-CM | POA: Diagnosis not present

## 2023-12-05 DIAGNOSIS — F32A Depression, unspecified: Secondary | ICD-10-CM | POA: Diagnosis not present

## 2023-12-05 DIAGNOSIS — H903 Sensorineural hearing loss, bilateral: Secondary | ICD-10-CM | POA: Diagnosis not present

## 2023-12-05 DIAGNOSIS — I2510 Atherosclerotic heart disease of native coronary artery without angina pectoris: Secondary | ICD-10-CM | POA: Diagnosis not present

## 2023-12-11 ENCOUNTER — Ambulatory Visit: Payer: Self-pay | Admitting: Sports Medicine

## 2023-12-11 ENCOUNTER — Encounter: Payer: Self-pay | Admitting: Sports Medicine

## 2023-12-11 ENCOUNTER — Ambulatory Visit
Admission: RE | Admit: 2023-12-11 | Discharge: 2023-12-11 | Disposition: A | Discharge status: Home / Self Care | Source: Ambulatory Visit | Attending: Sports Medicine | Admitting: Sports Medicine

## 2023-12-11 VITALS — BP 100/60 | HR 65 | Temp 96.8°F | Resp 16 | Ht 63.75 in | Wt 127.0 lb

## 2023-12-11 DIAGNOSIS — R109 Unspecified abdominal pain: Secondary | ICD-10-CM

## 2023-12-11 DIAGNOSIS — R42 Dizziness and giddiness: Secondary | ICD-10-CM | POA: Diagnosis not present

## 2023-12-11 DIAGNOSIS — K5900 Constipation, unspecified: Secondary | ICD-10-CM

## 2023-12-11 DIAGNOSIS — R413 Other amnesia: Secondary | ICD-10-CM | POA: Diagnosis not present

## 2023-12-11 DIAGNOSIS — R110 Nausea: Secondary | ICD-10-CM

## 2023-12-11 DIAGNOSIS — K5641 Fecal impaction: Secondary | ICD-10-CM | POA: Diagnosis not present

## 2023-12-11 MED ORDER — MIDODRINE HCL 5 MG PO TABS: 15.0000 mg | ORAL_TABLET | Freq: Every day | ORAL | 1 refills | Status: DC

## 2023-12-11 MED ORDER — DOCUSATE SODIUM 100 MG PO CAPS: 100.0000 mg | ORAL_CAPSULE | Freq: Two times a day (BID) | ORAL | 0 refills | Status: DC

## 2023-12-11 NOTE — Patient Instructions (Signed)
 Take colace twice daily  Increase water intake  Will order X ray at [facility] imaging place

## 2023-12-11 NOTE — Progress Notes (Signed)
 Careteam: Patient Care Team: Garnette Gunner, MD as PCP - General (Family Medicine) End, Cristal Deer, MD as PCP - Cardiology (Cardiology) Herby Abraham, MD as Referring Physician (Cardiology) Freddy Finner, MD (Inactive) as Attending Physician (Obstetrics and Gynecology) Lillia Mountain, LCSW as Social Worker (Licensed Clinical Social Worker)  PLACE OF SERVICE:  [facility]  Advanced Directive information Does Patient Have a Medical Advance Directive?: Yes, Type of Advance Directive: Healthcare Power of Attorney, Does patient want to make changes to medical advance directive?: No - Patient declined  No Known Allergies  Chief Complaint  Patient presents with   Establish Care    New patient.      Discussed the use of AI scribe software for clinical note transcription with the patient, who gave verbal consent to proceed.  History of Present Illness   The patient presents with memory problems and dizziness. She is accompanied by her daughter, who is her primary caregiver. She was referred by a previous provider for evaluation of her memory issues.  She has been experiencing memory problems for approximately one year. Her daughter reports incidents of confusion about the whereabouts of deceased family members and wandering to neighbors' homes. She has difficulty placing items correctly and experiences auditory hallucinations, such as hearing doorbells and phones ringing when they are not. She is able to bathe and dress herself but requires assistance with medication management and bill payments. She does not cook and has difficulty using a microwave. She often repeats questions and statements, such as asking about preferences for sports multiple times in a short period.  She experiences dizziness daily, which her daughter attributes to low blood pressure. She has had one fall in the past three months, which was more of a trip while trying to reach the bathroom due to nausea. Her  balance is unsteady, and she does not use a walker. She has started physical therapy recently.  She has been experiencing stomach pain for about three weeks, often holding her stomach. She has vomited multiple times in the past two weeks but does not experience pain during urination. She has regular bowel movements but sometimes skips a day. She does not wear incontinence products despite some incontinence issues.  She lives with her daughter, who moved in with her in December after incidents of confusion and wandering. She previously lived alone in [place]. She sleeps during the day, which affects her nighttime sleep. No chest pain and normal breathing. Occasional stomach pain, primarily in the middle of her abdomen, and has not had a bowel movement since the day before yesterday. No pain during urination and no recent weight loss.         Review of Systems:  Review of Systems  Constitutional:  Negative for chills and fever.  HENT:  Positive for hearing loss. Negative for congestion and sore throat.   Eyes:  Negative for double vision.  Respiratory:  Negative for cough, sputum production and shortness of breath.   Cardiovascular:  Negative for chest pain, palpitations and leg swelling.  Gastrointestinal:  Positive for abdominal pain and nausea. Negative for heartburn.  Genitourinary:  Negative for dysuria, frequency and hematuria.  Musculoskeletal:  Positive for falls. Negative for myalgias.  Neurological:  Positive for dizziness.   Negative unless indicated in HPI.   Past Medical History:  Diagnosis Date   Arthritis    Breast mass    left breast   Clotting disorder (HCC)    Coronary atherosclerosis    a.  s/p PCTA LAD (1992). b. Last cath in 10/11 with nonobstructive disease, EF 60% by LV-gram.    Dementia (HCC)    Depression    Diverticulosis of colon (without mention of hemorrhage)    Fibromyalgia    Glaucoma    Hemorrhage of rectum and anus    History of uterine cancer     Iron deficiency anemia, unspecified    Leukopenia    chronic--benign   Other pulmonary embolism and infarction    2011   Pure hypercholesterolemia    Thyroid disease    UTI (lower urinary tract infection)    Past Surgical History:  Procedure Laterality Date   ABDOMINAL HYSTERECTOMY     CARDIAC CATHETERIZATION  07/25/10   luminal irregularities in multiple vessels, no sig stenosis, EF 60%   CATARACT EXTRACTION  2004   right   COLONOSCOPY  07/01/2007   diverticulosis   CORONARY ANGIOPLASTY  1992   LAD   KIDNEY SURGERY     stent placed   NECK SURGERY     thyroid needle aspiration  12/2007   right, hyperplastic nodule   UPPER GASTROINTESTINAL ENDOSCOPY  07/16/2007   tortous esophagus with spasms   Social History:   reports that she has never smoked. She has never used smokeless tobacco. She reports that she does not drink alcohol and does not use drugs.  Family History  Problem Relation Age of Onset   Diabetes Mother        deceased age 4   Hypertension Mother    Pancreatic cancer Father        deceased age 35   Emphysema Brother        deceased   Alcohol abuse Brother    Osteoporosis Other    Stroke Maternal Aunt    Colon cancer Neg Hx    Heart attack Neg Hx     Medications: Patient's Medications  New Prescriptions   DOCUSATE SODIUM (COLACE) 100 MG CAPSULE    Take 1 capsule (100 mg total) by mouth 2 (two) times daily.  Previous Medications   ASPIRIN EC 81 MG TABLET    Take 1 tablet (81 mg total) by mouth daily.   ERGOCALCIFEROL (VITAMIN D2) 1.25 MG (50000 UT) CAPSULE    Take 50,000 Units by mouth every 14 (fourteen) days.   NAMZARIC 7-10 MG CP24    Take 1 capsule by mouth daily.   SIMVASTATIN (ZOCOR) 20 MG TABLET    20 mg.  Modified Medications   Modified Medication Previous Medication   MIDODRINE (PROAMATINE) 5 MG TABLET midodrine (PROAMATINE) 5 MG tablet      Take 3 tablets (15 mg total) by mouth daily.    Take 15 mg by mouth daily.  Discontinued Medications    CHLORPHEN-PHENYLEPH-APAP (CORICIDIN D COLD/FLU/SINUS) 2-5-325 MG TABS    As directed on package   DEXTROMETHORPHAN-GUAIFENESIN (MUCINEX DM) 30-600 MG 12HR TABLET    As directed on package    Physical Exam: Vitals:   12/11/23 1407  BP: 100/60  Pulse: 65  Resp: 16  Temp: (!) 96.8 F (36 C)  SpO2: 96%  Weight: 127 lb (57.6 kg)  Height: 5' 3.75" (1.619 m)   Body mass index is 21.97 kg/m. BP Readings from Last 3 Encounters:  12/11/23 100/60  11/15/23 (!) 118/57  11/04/23 (!) 109/50   Wt Readings from Last 3 Encounters:  12/11/23 127 lb (57.6 kg)  11/15/23 128 lb (58.1 kg)  11/04/23 128 lb 4.9 oz (58.2 kg)  Physical Exam Constitutional:      Appearance: Normal appearance.  HENT:     Head: Normocephalic and atraumatic.  Cardiovascular:     Rate and Rhythm: Normal rate and regular rhythm.  Pulmonary:     Effort: Pulmonary effort is normal. No respiratory distress.     Breath sounds: Normal breath sounds. No wheezing.  Abdominal:     General: Bowel sounds are normal. There is no distension.     Tenderness: There is no abdominal tenderness. There is no guarding or rebound.     Comments:    Musculoskeletal:        General: No swelling.  Neurological:     Mental Status: She is alert. Mental status is at baseline.     Motor: No weakness.     Labs reviewed: Basic Metabolic Panel: Recent Labs    11/04/23 2022  NA 139  K 4.5  CL 105  CO2 24  GLUCOSE 144*  BUN 17  CREATININE 1.41*  CALCIUM 8.7*   Liver Function Tests: No results for input(s): "AST", "ALT", "ALKPHOS", "BILITOT", "PROT", "ALBUMIN" in the last 8760 hours. No results for input(s): "LIPASE", "AMYLASE" in the last 8760 hours. No results for input(s): "AMMONIA" in the last 8760 hours. CBC: Recent Labs    11/04/23 2022  WBC 3.7*  HGB 13.1  HCT 40.2  MCV 96.4  PLT 196   Lipid Panel: No results for input(s): "CHOL", "HDL", "LDLCALC", "TRIG", "CHOLHDL", "LDLDIRECT" in the last 8760  hours. TSH: No results for input(s): "TSH" in the last 8760 hours. A1C: No results found for: "HGBA1C"  Assessment and Plan       1. Abdominal pain, unspecified abdominal location (Primary) No tenderness on pa - Complete Metabolic Panel with eGFR - DG Abd 1 View; Future  2. Memory problem Cont with supportive care  Provided information on council of Aging  3. Nausea - Complete Metabolic Panel with eGFR  4. Dizziness  Instructed patient to resume midorine - midodrine (PROAMATINE) 5 MG tablet; Take 3 tablets (15 mg total) by mouth daily.  Dispense: 90 tablet; Refill: 1  5. Constipation, unspecified constipation type   - docusate sodium (COLACE) 100 MG capsule; Take 1 capsule (100 mg total) by mouth 2 (two) times daily.  Dispense: 10 capsule; Refill: 0  Other orders - ergocalciferol (VITAMIN D2) 1.25 MG (50000 UT) capsule; Take 50,000 Units by mouth every 14 (fourteen) days.       Return in about 3 months (around 03/12/2024).Venita Sheffield

## 2023-12-12 DIAGNOSIS — Z9183 Wandering in diseases classified elsewhere: Secondary | ICD-10-CM | POA: Diagnosis not present

## 2023-12-12 DIAGNOSIS — F03918 Unspecified dementia, unspecified severity, with other behavioral disturbance: Secondary | ICD-10-CM | POA: Diagnosis not present

## 2023-12-12 DIAGNOSIS — H903 Sensorineural hearing loss, bilateral: Secondary | ICD-10-CM | POA: Diagnosis not present

## 2023-12-12 DIAGNOSIS — I10 Essential (primary) hypertension: Secondary | ICD-10-CM | POA: Diagnosis not present

## 2023-12-12 DIAGNOSIS — G8929 Other chronic pain: Secondary | ICD-10-CM | POA: Diagnosis not present

## 2023-12-12 DIAGNOSIS — F32A Depression, unspecified: Secondary | ICD-10-CM | POA: Diagnosis not present

## 2023-12-12 DIAGNOSIS — F0393 Unspecified dementia, unspecified severity, with mood disturbance: Secondary | ICD-10-CM | POA: Diagnosis not present

## 2023-12-12 DIAGNOSIS — M797 Fibromyalgia: Secondary | ICD-10-CM | POA: Diagnosis not present

## 2023-12-12 DIAGNOSIS — I2510 Atherosclerotic heart disease of native coronary artery without angina pectoris: Secondary | ICD-10-CM | POA: Diagnosis not present

## 2023-12-12 LAB — COMPLETE METABOLIC PANEL WITH GFR
AG Ratio: 1.3 (calc) (ref 1.0–2.5)
ALT: 11 U/L (ref 6–29)
AST: 16 U/L (ref 10–35)
Albumin: 3.5 g/dL — ABNORMAL LOW (ref 3.6–5.1)
Alkaline phosphatase (APISO): 71 U/L (ref 37–153)
BUN/Creatinine Ratio: 16 (calc) (ref 6–22)
BUN: 19 mg/dL (ref 7–25)
CO2: 31 mmol/L (ref 20–32)
Calcium: 9 mg/dL (ref 8.6–10.4)
Chloride: 108 mmol/L (ref 98–110)
Creat: 1.17 mg/dL — ABNORMAL HIGH (ref 0.60–0.95)
Globulin: 2.7 g/dL (ref 1.9–3.7)
Glucose, Bld: 94 mg/dL (ref 65–139)
Potassium: 4.4 mmol/L (ref 3.5–5.3)
Sodium: 142 mmol/L (ref 135–146)
Total Bilirubin: 0.4 mg/dL (ref 0.2–1.2)
Total Protein: 6.2 g/dL (ref 6.1–8.1)
eGFR: 42 mL/min/{1.73_m2} — ABNORMAL LOW (ref >=60)

## 2023-12-13 ENCOUNTER — Ambulatory Visit
Admission: RE | Admit: 2023-12-13 | Discharge: 2023-12-13 | Disposition: A | Discharge status: Home / Self Care | Payer: Medicare PPO | Source: Ambulatory Visit | Attending: Physician Assistant | Admitting: Physician Assistant

## 2023-12-13 DIAGNOSIS — R413 Other amnesia: Secondary | ICD-10-CM | POA: Diagnosis not present

## 2023-12-16 ENCOUNTER — Institutional Professional Consult (permissible substitution) (INDEPENDENT_AMBULATORY_CARE_PROVIDER_SITE_OTHER): Payer: Medicare PPO | Admitting: Otolaryngology

## 2023-12-18 ENCOUNTER — Institutional Professional Consult (permissible substitution) (INDEPENDENT_AMBULATORY_CARE_PROVIDER_SITE_OTHER): Payer: Medicare PPO | Admitting: Otolaryngology

## 2023-12-20 DIAGNOSIS — F0393 Unspecified dementia, unspecified severity, with mood disturbance: Secondary | ICD-10-CM | POA: Diagnosis not present

## 2023-12-20 DIAGNOSIS — Z9183 Wandering in diseases classified elsewhere: Secondary | ICD-10-CM | POA: Diagnosis not present

## 2023-12-20 DIAGNOSIS — I2510 Atherosclerotic heart disease of native coronary artery without angina pectoris: Secondary | ICD-10-CM | POA: Diagnosis not present

## 2023-12-20 DIAGNOSIS — G8929 Other chronic pain: Secondary | ICD-10-CM | POA: Diagnosis not present

## 2023-12-20 DIAGNOSIS — M797 Fibromyalgia: Secondary | ICD-10-CM | POA: Diagnosis not present

## 2023-12-20 DIAGNOSIS — F32A Depression, unspecified: Secondary | ICD-10-CM | POA: Diagnosis not present

## 2023-12-20 DIAGNOSIS — I10 Essential (primary) hypertension: Secondary | ICD-10-CM | POA: Diagnosis not present

## 2023-12-20 DIAGNOSIS — F03918 Unspecified dementia, unspecified severity, with other behavioral disturbance: Secondary | ICD-10-CM | POA: Diagnosis not present

## 2023-12-20 DIAGNOSIS — H903 Sensorineural hearing loss, bilateral: Secondary | ICD-10-CM | POA: Diagnosis not present

## 2023-12-25 ENCOUNTER — Telehealth: Payer: Self-pay | Admitting: Family Medicine

## 2023-12-25 NOTE — Telephone Encounter (Signed)
 Patient dropped off document Home Health Certificate (Order ID [id number]), to be filled out by provider. Patient requested to send it back via Fax within 7-days. Document is located in providers tray at front office.Please advise at Mobile [phone] (mobile)   Home health cert by fax. I put in the dr box

## 2023-12-25 NOTE — Telephone Encounter (Signed)
 Paperwork was obtained and placed in provider review/sign folder on 12/25/2023 @ 2:30pm

## 2023-12-26 DIAGNOSIS — M797 Fibromyalgia: Secondary | ICD-10-CM | POA: Diagnosis not present

## 2023-12-26 DIAGNOSIS — F32A Depression, unspecified: Secondary | ICD-10-CM | POA: Diagnosis not present

## 2023-12-26 DIAGNOSIS — M40209 Unspecified kyphosis, site unspecified: Secondary | ICD-10-CM

## 2023-12-26 DIAGNOSIS — F03918 Unspecified dementia, unspecified severity, with other behavioral disturbance: Secondary | ICD-10-CM | POA: Diagnosis not present

## 2023-12-26 DIAGNOSIS — H903 Sensorineural hearing loss, bilateral: Secondary | ICD-10-CM | POA: Diagnosis not present

## 2023-12-26 DIAGNOSIS — E7800 Pure hypercholesterolemia, unspecified: Secondary | ICD-10-CM

## 2023-12-26 DIAGNOSIS — I10 Essential (primary) hypertension: Secondary | ICD-10-CM | POA: Diagnosis not present

## 2023-12-26 DIAGNOSIS — F0393 Unspecified dementia, unspecified severity, with mood disturbance: Secondary | ICD-10-CM | POA: Diagnosis not present

## 2023-12-26 DIAGNOSIS — I2510 Atherosclerotic heart disease of native coronary artery without angina pectoris: Secondary | ICD-10-CM | POA: Diagnosis not present

## 2023-12-26 DIAGNOSIS — Z9183 Wandering in diseases classified elsewhere: Secondary | ICD-10-CM | POA: Diagnosis not present

## 2023-12-26 DIAGNOSIS — M1990 Unspecified osteoarthritis, unspecified site: Secondary | ICD-10-CM

## 2023-12-26 DIAGNOSIS — G8929 Other chronic pain: Secondary | ICD-10-CM | POA: Diagnosis not present

## 2023-12-27 ENCOUNTER — Encounter (INDEPENDENT_AMBULATORY_CARE_PROVIDER_SITE_OTHER): Payer: Self-pay | Admitting: Physician Assistant

## 2023-12-27 ENCOUNTER — Ambulatory Visit (INDEPENDENT_AMBULATORY_CARE_PROVIDER_SITE_OTHER): Admitting: Physician Assistant

## 2023-12-27 DIAGNOSIS — F0393 Unspecified dementia, unspecified severity, with mood disturbance: Secondary | ICD-10-CM | POA: Diagnosis not present

## 2023-12-27 DIAGNOSIS — F03918 Unspecified dementia, unspecified severity, with other behavioral disturbance: Secondary | ICD-10-CM | POA: Diagnosis not present

## 2023-12-27 DIAGNOSIS — M797 Fibromyalgia: Secondary | ICD-10-CM | POA: Diagnosis not present

## 2023-12-27 DIAGNOSIS — H903 Sensorineural hearing loss, bilateral: Secondary | ICD-10-CM | POA: Diagnosis not present

## 2023-12-27 DIAGNOSIS — Z9183 Wandering in diseases classified elsewhere: Secondary | ICD-10-CM | POA: Diagnosis not present

## 2023-12-27 DIAGNOSIS — I10 Essential (primary) hypertension: Secondary | ICD-10-CM | POA: Diagnosis not present

## 2023-12-27 DIAGNOSIS — G8929 Other chronic pain: Secondary | ICD-10-CM | POA: Diagnosis not present

## 2023-12-27 DIAGNOSIS — F32A Depression, unspecified: Secondary | ICD-10-CM | POA: Diagnosis not present

## 2023-12-27 DIAGNOSIS — I2510 Atherosclerotic heart disease of native coronary artery without angina pectoris: Secondary | ICD-10-CM | POA: Diagnosis not present

## 2023-12-27 NOTE — Progress Notes (Signed)
 Dear Dr. Janee Morn, Here is my assessment for our mutual patient, Erin Roberson. Thank you for allowing me the opportunity to care for your patient. Please do not hesitate to contact me should you have any other questions. Sincerely, Burna Forts PA-C  Otolaryngology Clinic Note Referring provider: Dr. Janee Morn HPI:  Erin Roberson is a 89 y.o. female kindly referred by Dr. Janee Morn   The patient is a 89 year old female seen in our office for evaluation of hearing loss.  The patient is accompanied by her daughter who provides her history.  She notes that the visit was for a routine evaluation.  Upon chart review the patient does have a history of hearing loss and has been using hearing aids but they do not seem to be helping.  The patient notes significant hearing deficits which have been increasing.  She denies any hearing loss at a young age, no head or neck surgeries, no issues of infections as a kid.  She is a nondrinker and does not smoke.  She has been seen at [facility] and has seen their audiologist who did give hearing aids at that time.  She notes a history of balance issues and is starting physical therapy for this.     Independent Review of Additional Tests or Records:  [facility]- Dr. Marene Lenz- sensorineural hearing loss   PMH/Meds/All/SocHx/FamHx/ROS:   Past Medical History:  Diagnosis Date   Arthritis    Breast mass    left breast   Clotting disorder (HCC)    Coronary atherosclerosis    a. s/p PCTA LAD (1992). b. Last cath in 10/11 with nonobstructive disease, EF 60% by LV-gram.    Dementia (HCC)    Depression    Diverticulosis of colon (without mention of hemorrhage)    Fibromyalgia    Glaucoma    Hemorrhage of rectum and anus    History of uterine cancer    Iron deficiency anemia, unspecified    Leukopenia    chronic--benign   Other pulmonary embolism and infarction    2011   Pure hypercholesterolemia    Thyroid disease    UTI (lower urinary tract  infection)      Past Surgical History:  Procedure Laterality Date   ABDOMINAL HYSTERECTOMY     CARDIAC CATHETERIZATION  07/25/10   luminal irregularities in multiple vessels, no sig stenosis, EF 60%   CATARACT EXTRACTION  2004   right   COLONOSCOPY  07/01/2007   diverticulosis   CORONARY ANGIOPLASTY  1992   LAD   KIDNEY SURGERY     stent placed   NECK SURGERY     thyroid needle aspiration  12/2007   right, hyperplastic nodule   UPPER GASTROINTESTINAL ENDOSCOPY  07/16/2007   tortous esophagus with spasms    Family History  Problem Relation Age of Onset   Diabetes Mother        deceased age 57   Hypertension Mother    Pancreatic cancer Father        deceased age 47   Emphysema Brother        deceased   Alcohol abuse Brother    Osteoporosis Other    Stroke Maternal Aunt    Colon cancer Neg Hx    Heart attack Neg Hx      Social Connections: Not on file      Current Outpatient Medications:    aspirin EC 81 MG tablet, Take 1 tablet (81 mg total) by mouth daily., Disp: , Rfl:    docusate  sodium (COLACE) 100 MG capsule, Take 1 capsule (100 mg total) by mouth 2 (two) times daily., Disp: 10 capsule, Rfl: 0   ergocalciferol (VITAMIN D2) 1.25 MG (50000 UT) capsule, Take 50,000 Units by mouth every 14 (fourteen) days., Disp: , Rfl:    midodrine (PROAMATINE) 5 MG tablet, Take 3 tablets (15 mg total) by mouth daily., Disp: 90 tablet, Rfl: 1   NAMZARIC 7-10 MG CP24, Take 1 capsule by mouth daily., Disp: , Rfl: 3   simvastatin (ZOCOR) 20 MG tablet, 20 mg., Disp: , Rfl:    Physical Exam:   There were no vitals taken for this visit.  Pertinent Findings  CN II-XII intact Bilateral external auditory canal clear, TMs intact with well pneumatized middle ear space. Anterior rhinoscopy: Septum with left deviation; bilateral inferior turbinates with no hypertrophy No lesions of oral cavity/oropharynx; dentition wnl No obviously palpable neck masses/lymphadenopathy/thyromegaly No  respiratory distress or stridor   Seprately Identifiable Procedures:  none  Impression & Plans:  Erin Roberson is a 89 y.o. female with the following   Hearing loss-  This is a 89 year old female with history of sensorineural hearing loss.  I would advise her to obtain an updated audiological evaluation.  She would like to keep her appointment with [facility] for audio evaluation.  Once these results are available I will review them and provide recommendations.  She was given strict return precautions.  She verbalized understanding and agreement to today's plan.  - f/u after audiological evaluation   Thank you for allowing me the opportunity to care for your patient. Please do not hesitate to contact me should you have any other questions.  Sincerely, Burna Forts PA-C [facility] Phone: [phone] Fax: [phone]  12/27/2023, 1:49 PM

## 2023-12-27 NOTE — Telephone Encounter (Signed)
 Form faxed to [employer].

## 2023-12-30 ENCOUNTER — Ambulatory Visit: Attending: Family Medicine | Admitting: Audiology

## 2023-12-30 DIAGNOSIS — H903 Sensorineural hearing loss, bilateral: Secondary | ICD-10-CM | POA: Insufficient documentation

## 2023-12-31 NOTE — Procedures (Signed)
  Outpatient Audiology and [facility] [address] [place], [place]  [address] [phone]  AUDIOLOGICAL  EVALUATION  NAME: FREADA TWERSKY     DOB:   27-Jun-1924      MRN: [id number]                                                                                     DATE: 12/31/2023     REFERENT: Garnette Gunner, MD STATUS: Outpatient DIAGNOSIS: Sensorineural hearing loss  History: Yvonne was seen for an audiological evaluation due to concerns regarding her hearing sensitivity.  Hyla was accompanied to the appointment by her daughter.  Aleathia's daughter reports Mardella has had hearing loss for many years.  Elmer and her daughter showed up with 2 pairs of hearing aids.  Saree's daughter reports Kaisey received the hearing aids from [facility].  The daughter reports Jonnelle is not currently utilizing the hearing aids and they are not working for her.   Evaluation:  Otoscopy showed a clear view of the tympanic membranes, bilaterally Tympanometry results were consistent with normal middle ear pressure and normal tympanic membrane mobility (Type A) bilaterally Audiometric testing was completed using Conventional Audiometry techniques with insert earphones and TDH headphones. Test results are consistent in the right ear with a mild sloping to severe sensorineural hearing loss.  Test results are consistent in the left ear with a moderately severe to severe sensorineural hearing loss.  There is an asymmetry noted at 250 to 2000 Hz, worse in the left ear.  Speech Recognition Thresholds were obtained at 55 dB HL in the right ear and at 80 dB HL in the left ear. Word Recognition Testing was completed at 80% dB HL and Armonii scored 68% in the right ear and was completed at 90 dB HL and Seher scored 44% in the left ear.  Asymmetric word recognition noted, worse in the left ear.  Results:  The test results were reviewed with Corrie Dandy and her daughter.Test results are consistent in the right ear with a mild  sloping to severe sensorineural hearing loss.  Test results are consistent in the left ear with a moderately severe to severe sensorineural hearing loss.  There is an asymmetry noted at 250 to 2000 Hz, worse in the left ear.  Sienna will have hearing and communication difficulty in all listening environments. She will benefit from the continued use of hearing aids.  Donika was encouraged to schedule a hearing aid appointment at [facility].  Eugene and her daughter were given the phone number and address to [facility] to schedule an appointment for [facility] hearing aids.  Recommendations: 1.   Schedule hearing aid check appointment at [facility] to have Zamiyah's hearing aids cleaned and reprogrammed 2.   Continue use of amplification 3.    Audiological monitoring is recommended   40 minutes spent testing and counseling on results.   If you have any questions please feel free to contact me at [phone].  Marton Redwood Audiologist, Au.D., CCC-A 12/31/2023  1:54 PM  Cc: Garnette Gunner, MD

## 2024-01-03 ENCOUNTER — Other Ambulatory Visit: Payer: Self-pay | Admitting: Sports Medicine

## 2024-01-03 DIAGNOSIS — R42 Dizziness and giddiness: Secondary | ICD-10-CM

## 2024-01-08 ENCOUNTER — Ambulatory Visit: Payer: Self-pay

## 2024-01-08 NOTE — Patient Outreach (Signed)
 Complex Care Management   Visit Note  01/08/2024  Name:  Erin Roberson MRN: [id number] DOB: 1924/05/18  Situation: Referral received for Complex Care Management related to Dementia I obtained verbal consent from [name] the patient's daughter and caregiver.  Visit completed with [name]  on the phone  Background:   Past Medical History:  Diagnosis Date   Arthritis    Breast mass    left breast   Clotting disorder (HCC)    Coronary atherosclerosis    a. s/p PCTA LAD (1992). b. Last cath in 10/11 with nonobstructive disease, EF 60% by LV-gram.    Dementia (HCC)    Depression    Diverticulosis of colon (without mention of hemorrhage)    Fibromyalgia    Glaucoma    Hemorrhage of rectum and anus    History of uterine cancer    Iron deficiency anemia, unspecified    Leukopenia    chronic--benign   Other pulmonary embolism and infarction    2011   Pure hypercholesterolemia    Thyroid disease    UTI (lower urinary tract infection)     Assessment: [name] states that her mother is able to do things in the home. She has a hard time remembering family member.  She had a MR of her brain on 12/13/23 and [name] requested that I send a  message for an appointment to discuss the results.   Patient Reported Symptoms:  Cognitive Able to follow simple commands, Alert and oriented to person, place, and time, Struggling with memory recall  Neurological Dizziness    HEENT No symptoms reported    Cardiovascular Dizziness    Respiratory No symptoms reported    Endocrine No symptoms reported    Gastrointestinal No symptoms reported    Genitourinary No symptoms reported    Integumentary No symptoms reported    Musculoskeletal No symptoms reported    Psychosocial Not assessed     There were no vitals filed for this visit.  Medications Reviewed Today     Reviewed by Juanell Fairly, RN (Registered Nurse) on 01/08/24 at 1516  Med List Status: <None>   Medication Order Taking? Sig  Documenting Provider Last Dose Status Informant  aspirin EC 81 MG tablet [id number] Yes Take 1 tablet (81 mg total) by mouth daily. End, Cristal Deer, MD Taking Active   docusate sodium (COLACE) 100 MG capsule [id number] No Take 1 capsule (100 mg total) by mouth 2 (two) times daily.  Patient not taking: Reported on 01/08/2024   Venita Sheffield, MD Not Taking Active   ergocalciferol (VITAMIN D2) 1.25 MG (50000 UT) capsule [id number] Yes Take 50,000 Units by mouth every 14 (fourteen) days. [provider] Taking Active   midodrine (PROAMATINE) 5 MG tablet [id number] Yes Take 3 tablets (15 mg total) by mouth daily. Venita Sheffield, MD Taking Active            Med Note Bing Ree, Deland Pretty Jan 08, 2024  3:15 PM) Only take when she feels dizzy  NAMZARIC 7-10 MG CP24 [id number] Yes Take 1 capsule by mouth daily. [provider] Taking Active Self  simvastatin (ZOCOR) 20 MG tablet [id number] Yes 20 mg. [provider] Taking Active             Recommendation:   Specialty provider follow-up requested  Follow Up Plan:   Telephone follow-up in 2 weeks  Juanell Fairly RN, BSN, [facility]  [facility], [facility]  Care Coordinator Phone: [phone]

## 2024-01-08 NOTE — Patient Instructions (Signed)
 Visit Information  Thank you for taking time to visit with me today. Please don't hesitate to contact me if I can be of assistance to you before our next scheduled appointment.  Our next appointment is by telephone on 01/24/24 at 1015 am Please call the care guide team at [phone] if you need to cancel or reschedule your appointment.   Following is a copy of your care plan:   Goals Addressed             This Visit's Progress    VBCI RN Care Plan       Problems:  Chronic Disease Management support and education needs related to Dementia Cognitive Deficits  Goal: Over the next 3 months the Caregiver will attend all scheduled medical appointments: with PCP and specialist as evidenced by keeping al scheduled appointments        demonstrate Ongoing adherence to prescribed treatment plan for dementia as evidenced by no admissions to the hospital verbalize basic understanding of dementia disease process and self health management plan as evidenced by verbal explanation lifestyle changes and consistent medication compliance   Interventions:   Dementia:  (Status:  New goal. and Goal on track:  Yes.)  Long Term Goal Evaluation of current treatment plan related to misuse of: Dementia  Reviewed medications including , Emotional Support Provided to patient/caregiver, Sleep assessment completed, Discussed importance of attendance to all provider appointments, and Advised to contact provider for new or worsening symptoms  Patient Self-Care Activities:  Attend all scheduled provider appointments Call pharmacy for medication refills 3-7 days in advance of running out of medications Call provider office for new concerns or questions  Perform all self care activities independently  Take medications as prescribed    Plan:  Telephone follow up appointment with care management team member scheduled for:  01/24/24  1015 am             Please call 1-800-273-TALK (toll free, 24 hour  hotline) if you are experiencing a Mental Health or Behavioral Health Crisis or need someone to talk to.  Patient verbalizes understanding of instructions and care plan provided today and agrees to view in MyChart. Active MyChart status and patient understanding of how to access instructions and care plan via MyChart confirmed with patient.     Juanell Fairly RN, BSN, [facility]  [facility]  Care Coordinator Phone: [phone]

## 2024-01-09 ENCOUNTER — Telehealth: Payer: Self-pay

## 2024-01-09 NOTE — Telephone Encounter (Signed)
 Copied from CRM [id number]. Topic: General - Other >> Jan 09, 2024 10:01 AM Almira Coaster wrote: Reason for CRM: Sree from [facility] is calling to advise that patient and care giver have decided to put the physical therapy on hold, he believes they may be out of town. Best call back number is  [phone] if you may have any questions or concerns.

## 2024-01-24 ENCOUNTER — Other Ambulatory Visit: Payer: Self-pay

## 2024-02-07 ENCOUNTER — Telehealth: Payer: Self-pay | Admitting: *Deleted

## 2024-02-07 NOTE — Telephone Encounter (Signed)
 This is Not Our patient.  Forwarded back to Brendon Caller.

## 2024-02-07 NOTE — Telephone Encounter (Signed)
 Copied from CRM [id number]. Topic: Clinical - Medication Question >> Arlette Schaad 9, 2025 11:39 AM [name] wrote: Reason for CRM: Patients daughter called and wanted to know if the patient needs to stop taking the NAMZARIC  7-10 MG CP24 or can she get a refill to last until her appointment or what does she need to do, patients Erin Roberson's callback number is [phone].

## 2024-02-10 ENCOUNTER — Telehealth: Payer: Self-pay

## 2024-02-17 ENCOUNTER — Telehealth: Payer: Self-pay

## 2024-02-17 NOTE — Progress Notes (Signed)
 Complex Care Management Note Care Guide Note  02/17/2024 Name: Erin Roberson MRN: [id number] DOB: [birth year]   Complex Care Management Outreach Attempts: An unsuccessful telephone outreach was attempted today to offer the patient information about available complex care management services.  Follow Up Plan:  Additional outreach attempts will be made to offer the patient complex care management information and services.   Encounter Outcome:  Patient Request to Call Back  [facility]  [facility], [facility] Health Care Management Assistant Direct Dial: [phone]  Fax: [phone]

## 2024-02-19 NOTE — Progress Notes (Signed)
 Complex Care Management Care Guide Note  02/19/2024 Name: [name] MRN: [id number] DOB: 1924-02-24  Erin Roberson is a 89 y.o. year old female who is a primary care patient of Catheryn Cluck, MD and is actively engaged with the care management team. I reached out to Francies Ireland by phone today to assist with scheduling  with the RN Case Manager.  Follow up plan: Telephone appointment with complex care management team member scheduled for:  03/05/24 at 1:00 p.m.  [facility]  [facility], [facility] Health Care Management Assistant Direct Dial: [phone]  Fax: [phone]

## 2024-02-28 ENCOUNTER — Ambulatory Visit: Payer: Medicare PPO

## 2024-03-02 DIAGNOSIS — H34812 Central retinal vein occlusion, left eye, with macular edema: Secondary | ICD-10-CM | POA: Diagnosis not present

## 2024-03-05 ENCOUNTER — Other Ambulatory Visit: Payer: Self-pay

## 2024-03-05 NOTE — Patient Instructions (Signed)
 Visit Information  Thank you for taking time to visit with me today. Please don't hesitate to contact me if I can be of assistance to you before our next scheduled appointment.  Our next appointment is by telephone on 03/19/24 at 2:00 Please call the care guide team at [phone] if you need to cancel or reschedule your appointment.   Following is a copy of your care plan:   Goals Addressed             This Visit's Progress    VBCI RN Care Plan       Problems:  Chronic Disease Management support and education needs related to Dementia Cognitive Deficits  Goal: Over the next 3 months the Caregiver will attend all scheduled medical appointments: with PCP and specialist as evidenced by keeping al scheduled appointments        demonstrate Ongoing adherence to prescribed treatment plan for dementia as evidenced by no admissions to the hospital verbalize basic understanding of dementia disease process and self health management plan as evidenced by verbal explanation lifestyle changes and consistent medication compliance   Interventions:   Dementia:  (Status:  New goal. and Goal on track:  Yes.)  Long Term Goal Evaluation of current treatment plan related to misuse of: Dementia  Reviewed medications including , Emotional Support Provided to patient/caregiver, Sleep assessment completed, Discussed importance of attendance to all provider appointments, and Advised to contact provider for new or worsening symptoms  Patient Self-Care Activities:  Attend all scheduled provider appointments Call pharmacy for medication refills 3-7 days in advance of running out of medications Call provider office for new concerns or questions  Perform all self care activities independently  Take medications as prescribed    Plan:  Telephone follow up appointment with care management team member scheduled for:  03/19/24 at 2:00  [facility] requested [facility] order sent to [facility] for PT/OT/home safety equipment  need, podiatry referral, refill of medications and clarification of PCP status.             Please call the Suicide and Crisis Lifeline: 988 call the [place]  National Suicide Prevention Lifeline: (320) 822-9068 or TTY: 618-684-5120 TTY 980-655-7674) to talk to a trained counselor call 1-800-273-TALK (toll free, 24 hour hotline) if you are experiencing a Mental Health or Behavioral Health Crisis or need someone to talk to.  Patient verbalizes understanding of instructions and care plan provided today and agrees to view in MyChart. Active MyChart status and patient understanding of how to access instructions and care plan via MyChart confirmed with patient.      Clarnce Crow BSN RN CCM [place]  [facility], [facility] Health RN Care Manager Direct Dial: [phone] Fax: [phone]

## 2024-03-05 NOTE — Patient Outreach (Signed)
 Complex Care Management   Visit Note  03/05/2024  Name:  Erin Roberson MRN: [id number] DOB: 1924/06/26  Situation: Referral received for Complex Care Management related to Dementia I obtained verbal consent from Caregiver.  Visit completed with patient daughter Erin Roberson  on the phone  Background:   Past Medical History:  Diagnosis Date   Arthritis    Breast mass    left breast   Clotting disorder (HCC)    Coronary atherosclerosis    a. s/p PCTA LAD (1992). b. Last cath in 10/11 with nonobstructive disease, EF 60% by LV-gram.    Dementia (HCC)    Depression    Diverticulosis of colon (without mention of hemorrhage)    Fibromyalgia    Glaucoma    Hemorrhage of rectum and anus    History of uterine cancer    Iron deficiency anemia, unspecified    Leukopenia    chronic--benign   Other pulmonary embolism and infarction    2011   Pure hypercholesterolemia    Thyroid  disease    UTI (lower urinary tract infection)     Assessment: Patient Reported Symptoms:  Cognitive Cognitive Status: Able to follow simple commands, Alert and oriented to person, place, and time, Struggling with memory recall      Neurological Neurological Review of Symptoms:  (patient would like to discuss results of MRI done 3/25) Neurological Conditions: Headache Neurological Management Strategies: Routine screening  HEENT HEENT Symptoms Reported: Sudden change or loss of vision (recently diagnosed with left eye hemorrhage) HEENT Management Strategies: Routine screening    Cardiovascular Cardiovascular Symptoms Reported: Dizziness (takes medication PRN, will discuss at next visit) Does patient have uncontrolled Hypertension?: No Cardiovascular Management Strategies: Medication therapy  Respiratory Respiratory Symptoms Reported: No symptoms reported    Endocrine Patient reports the following symptoms related to hypoglycemia or hyperglycemia : No symptoms reported Is patient diabetic?: No     Gastrointestinal Gastrointestinal Symptoms Reported: Constipation Gastrointestinal Conditions: Constipation Gastrointestinal Management Strategies: Medication therapy Nutrition Risk Screen (CP): No indicators present  Genitourinary Genitourinary Symptoms Reported: No symptoms reported    Integumentary Integumentary Symptoms Reported: No symptoms reported    Musculoskeletal Musculoskelatal Symptoms Reviewed: No symptoms reported, Unsteady gait Additional Musculoskeletal Details: Patient was receiving PT via [facility], daughter would like to restart but with different agency.  [facility] (needs safety PT/OT eval) Musculoskeletal Conditions: Joint pain Falls in the past year?: Yes Number of falls in past year: 2 or more Was there an injury with Fall?: No Fall Risk Category Calculator: 2 Patient Fall Risk Level: Moderate Fall Risk Patient at Risk for Falls Due to: Impaired balance/gait  Psychosocial Psychosocial Symptoms Reported: Not assessed     Quality of Family Relationships: helpful, involved, supportive      12/11/2023    2:18 PM  Depression screen PHQ 2/9  Decreased Interest 0  Down, Depressed, Hopeless 0  PHQ - 2 Score 0    There were no vitals filed for this visit.  Medications Reviewed Today     Reviewed by Clarnce Crow, RN (Registered Nurse) on 03/05/24 at 1331  Med List Status: <None>   Medication Order Taking? Sig Documenting Provider Last Dose Status Informant  aspirin  EC 81 MG tablet [id number] Yes Take 1 tablet (81 mg total) by mouth daily. End, Veryl Gottron, MD Taking Active   docusate sodium  (COLACE) 100 MG capsule [id number] Yes Take 1 capsule (100 mg total) by mouth 2 (two) times daily. Tye Gall, MD Taking Active   ergocalciferol (VITAMIN D2) 1.25  MG (50000 UT) capsule [id number] Yes Take 50,000 Units by mouth every 14 (fourteen) days. [provider] Taking Active   midodrine  (PROAMATINE ) 5 MG tablet [id number] Yes Take 3 tablets (15 mg  total) by mouth daily. Tye Gall, MD Taking Active            Med Note Arleta Lade, Pollie Bring Jan 08, 2024  3:15 PM) Only take when she feels dizzy  NAMZARIC  7-10 MG CP24 [id number] No Take 1 capsule by mouth daily.  Patient not taking: Reported on 03/05/2024   [provider] Not Taking Active Self           Med Note Burley Carpenter, Ezekial Arns   Thu Mar 05, 2024  1:31 PM) Patient has been out for a few weeks, [facility] [address]  simvastatin (ZOCOR) 20 MG tablet [id number] Yes 20 mg. [provider] Taking Active             Recommendation:   PCP Follow-up Specialty provider follow-up Neurology to provide results of MRI  Referral to: podiatry requested Home Health requests: Occupational Therapy Physical therapy Home safety evaluation  Follow Up Plan:   Telephone follow up appointment date/time:  03/19/24 at 2:00   Clarnce Crow BSN RN CCM [place]  [facility], [facility] Health RN Care Manager Direct Dial: [phone] Fax: [phone]

## 2024-03-09 ENCOUNTER — Other Ambulatory Visit: Payer: Self-pay | Admitting: Sports Medicine

## 2024-03-09 ENCOUNTER — Telehealth: Payer: Self-pay

## 2024-03-09 DIAGNOSIS — K5900 Constipation, unspecified: Secondary | ICD-10-CM

## 2024-03-09 NOTE — Telephone Encounter (Signed)
 Patient established with Dr.Veludandi, then Dr.Thompson was added as PCP on 12/25/23. That is the reason Dr.Veludandi is not on chart as it appears patient established with another provider after initially seeing us .    Patient needs an appointment prior to orders being provided. Mychart message sent to patient and I also left a detailed message for Nakeshia Waldeck about the need for an appointment

## 2024-03-09 NOTE — Patient Outreach (Signed)
 RNCM Telephone Encounter Note  Received call back from patient daughter [name].  She reports she will contact pt insurance to change PCP to Dr. Denny Flack.  Was appreciative of refills ordered.  Expressed understanding of change to [facility] from myself to [facility], would like to wait until after patient visit on 03/17/24 before scheduling a telephone visit with [facility].   Clarnce Crow BSN RN CCM [place]  [facility], [facility] Health RN Care Manager Direct Dial: [phone] Fax: [phone]

## 2024-03-09 NOTE — Patient Outreach (Signed)
 RNCM Telephone Encounter Note  Spoke to [employer] RNCM for [facility].  Explained transition from Dr. Hildy Lowers to Dr. Denny Flack as PCP as per Dr. Hildy Lowers, he only had the visit with patient to refer to [facility]  Dr Denny Flack had initial visit on 12/11/23 to establish care.  Called patient daughter [name], left message on voicemail requesting a call back, and also requesting that she contact patient insurance company to have PCP change completed.  Patient has upcoming appointment at [facility] on 03/17/24.  Called [place] and spoke to staff Lowell Rude), explained that PCP change is needed on patient chart, requested refills of meds requested by patient during my visit on 03/05/24, as well as patient desire to start [facility] PT/OT with [facility], and need for DME (tub transfer bench) which can be discussed during visit.     Clarnce Crow BSN RN CCM [place]  [facility], [facility] Health RN Care Manager Direct Dial: [phone] Fax: [phone]

## 2024-03-09 NOTE — Telephone Encounter (Signed)
 Copied from CRM [id number]. Topic: Clinical - Medication Refill >> Mar 09, 2024 10:11 AM Adrianna P wrote: Medication: NAMZARIC  7-10 MG CP24 simvastatin (ZOCOR) 20 MG tablet ergocalciferol (VITAMIN D2) 1.25 M docusate sodium  (COLACE) 100 MG capsule  Has the patient contacted their pharmacy? No (Agent: If no, request that the patient contact the pharmacy for the refill. If patient does not wish to contact the pharmacy document the reason why and proceed with request.) (Agent: If yes, when and what did the pharmacy advise?)  This is the patient's preferred [phone] [place]ette Nestle, [place] - [address] [place] [address] Phone: [phone] Fax: [phone]  Is this the correct pharmacy for this prescription? Yes If no, delete pharmacy and type the correct one.   Has the prescription been filled recently? No  Is the patient out of the medication? Yes  Has the patient been seen for an appointment in the last year OR does the patient have an upcoming appointment? Yes  Can we respond through MyChart? No  Agent: Please be advised that Rx refills may take up to 3 business days. We ask that you follow-up with your pharmacy.

## 2024-03-09 NOTE — Telephone Encounter (Signed)
 Copied from CRM [id number]. Topic: General - Other >> Mar 09, 2024 10:06 AM Adrianna P wrote: Reason for CRM: Patients pcp is not list as Dr. Fara Hone , this change needs to be completed in patients chart. She needs orders for Pt and Ot at [facility], she also needs a tub transfer bench.

## 2024-03-09 NOTE — Telephone Encounter (Signed)
 Last Fill: Colace: 12/11/23     Vitamin D2: Unk, Historical Provider     Namzaric : Unk, Historical Provider     Simvastatin: Unk, Historical Provider  Last OV: 12/11/23 Next OV: 03/17/24  Routing to provider for review/authorization.

## 2024-03-10 DIAGNOSIS — H43813 Vitreous degeneration, bilateral: Secondary | ICD-10-CM | POA: Diagnosis not present

## 2024-03-10 DIAGNOSIS — H35373 Puckering of macula, bilateral: Secondary | ICD-10-CM | POA: Diagnosis not present

## 2024-03-10 DIAGNOSIS — H34812 Central retinal vein occlusion, left eye, with macular edema: Secondary | ICD-10-CM | POA: Diagnosis not present

## 2024-03-10 DIAGNOSIS — Z961 Presence of intraocular lens: Secondary | ICD-10-CM | POA: Diagnosis not present

## 2024-03-10 MED ORDER — DOCUSATE SODIUM 100 MG PO CAPS: 100.0000 mg | ORAL_CAPSULE | Freq: Two times a day (BID) | ORAL | 0 refills | Status: DC

## 2024-03-10 MED ORDER — NAMZARIC 7-10 MG PO CP24: 1.0000 | ORAL_CAPSULE | Freq: Every day | ORAL | 0 refills | Status: DC

## 2024-03-10 MED ORDER — ERGOCALCIFEROL 1.25 MG (50000 UT) PO CAPS: 50000.0000 [IU] | ORAL_CAPSULE | ORAL | 0 refills | Status: DC

## 2024-03-10 MED ORDER — SIMVASTATIN 20 MG PO TABS: 20.0000 mg | ORAL_TABLET | Freq: Every day | ORAL | 0 refills | Status: DC

## 2024-03-10 NOTE — Telephone Encounter (Signed)
Patient requested refill. Has upcoming appointment.  

## 2024-03-17 ENCOUNTER — Ambulatory Visit
Admission: RE | Admit: 2024-03-17 | Discharge: 2024-03-17 | Disposition: A | Discharge status: Home / Self Care | Source: Ambulatory Visit | Attending: Sports Medicine | Admitting: Sports Medicine

## 2024-03-17 ENCOUNTER — Encounter: Payer: Self-pay | Admitting: Sports Medicine

## 2024-03-17 ENCOUNTER — Ambulatory Visit (INDEPENDENT_AMBULATORY_CARE_PROVIDER_SITE_OTHER): Admitting: Sports Medicine

## 2024-03-17 VITALS — BP 80/60 | HR 77 | Temp 96.5°F | Resp 16 | Ht 63.75 in | Wt 123.4 lb

## 2024-03-17 DIAGNOSIS — R42 Dizziness and giddiness: Secondary | ICD-10-CM | POA: Diagnosis not present

## 2024-03-17 DIAGNOSIS — R051 Acute cough: Secondary | ICD-10-CM | POA: Diagnosis not present

## 2024-03-17 DIAGNOSIS — R634 Abnormal weight loss: Secondary | ICD-10-CM | POA: Diagnosis not present

## 2024-03-17 LAB — BASIC METABOLIC PANEL WITHOUT GFR
BUN/Creatinine Ratio: 27 (calc) — ABNORMAL HIGH (ref 6–22)
BUN: 30 mg/dL — ABNORMAL HIGH (ref 7–25)
CO2: 28 mmol/L (ref 20–32)
Calcium: 9 mg/dL (ref 8.6–10.4)
Chloride: 107 mmol/L (ref 98–110)
Creat: 1.13 mg/dL — ABNORMAL HIGH (ref 0.60–0.95)
Glucose, Bld: 71 mg/dL (ref 65–139)
Potassium: 4.2 mmol/L (ref 3.5–5.3)
Sodium: 142 mmol/L (ref 135–146)

## 2024-03-17 LAB — CBC
HCT: 36.8 % (ref 35.0–45.0)
Hemoglobin: 12 g/dL (ref 11.7–15.5)
MCH: 31.3 pg (ref 27.0–33.0)
MCHC: 32.6 g/dL (ref 32.0–36.0)
MCV: 96.1 fL (ref 80.0–100.0)
MPV: 9.8 fL (ref 7.5–12.5)
Platelets: 227 10*3/uL (ref 140–400)
RBC: 3.83 10*6/uL (ref 3.80–5.10)
RDW: 12.2 % (ref 11.0–15.0)
WBC: 3.8 10*3/uL (ref 3.8–10.8)

## 2024-03-17 MED ORDER — BENZONATATE 200 MG PO CAPS: 200.0000 mg | ORAL_CAPSULE | Freq: Two times a day (BID) | ORAL | 0 refills | Status: DC | PRN

## 2024-03-17 MED ORDER — MIDODRINE HCL 5 MG PO TABS: 5.0000 mg | ORAL_TABLET | Freq: Every day | ORAL | 1 refills | Status: DC

## 2024-03-17 NOTE — Progress Notes (Signed)
 Careteam: Patient Care Team: Tye Gall, MD as PCP - General (Internal Medicine) End, Veryl Gottron, MD as PCP - Cardiology (Cardiology) Kristopher Pheasant, MD as Referring Physician (Cardiology) Leontine Rana, MD (Inactive) as Attending Physician (Obstetrics and Gynecology) Augustin Leber, RN as [facility] Care Management Augustin Leber, RN  PLACE OF SERVICE:  [facility]  Advanced Directive information Does Patient Have a Medical Advance Directive?: Yes, Type of Advance Directive: Healthcare Power of Attorney, Does patient want to make changes to medical advance directive?: No - Patient declined  No Known Allergies  Chief Complaint  Patient presents with   Medical Management of Chronic Issues    3 month follow up. Discuss the need for Covid booster, Shingrix vaccine, Dexa scan, and AWV.     Discussed the use of AI scribe software for clinical note transcription with the patient, who gave verbal consent to proceed.  History of Present Illness    Erin Roberson is a 89 year old female who presents with a persistent cough and congestion. She is accompanied by her daughter, who is her primary caregiver.  She has been experiencing  cough, primarily at night, along with congestion and white mucus production for the past four days. No difficulty breathing, fever, nasal congestion, or post-nasal drip.    There has been a decline in appetite over the past couple of weeks, with her eating only one substantial meal a day and occasionally consuming Ensure in the mornings. She has lost approximately two to three pounds during this time. Her sleep pattern is disrupted, with her sleeping during the day and staying awake at night.  She has a history of memory issues and has been without her Namzaric  prescription for two to three weeks, which her daughter believes has led to increased confusion. She has been asking about deceased relatives, indicating a possible decline in cognitive  function.  She experiences dizziness upon standing, and her daughter reports that she seldom drinks water. She is currently taking simvastatin  for cholesterol. She is able to perform some activities of daily living independently, such as bathing, although she requires assistance getting out of the tub. She does not wear diapers but has needed them in the past when taking medication for constipation. Pt knows her name, can recognize her daughter oriented to place but not time. Her BP is running low, she was prescribed midodrine  in the past but not been taking since some time. Pt seems pleasant and comfortable and does not appear to be in distress.  Review of Systems:  Review of Systems  Constitutional:  Negative for chills and fever.  HENT:  Negative for congestion and sore throat.   Eyes:  Negative for double vision.  Respiratory:  Positive for cough and sputum production. Negative for shortness of breath.   Cardiovascular:  Negative for chest pain, palpitations and leg swelling.  Gastrointestinal:  Negative for abdominal pain, heartburn and nausea.  Genitourinary:  Negative for dysuria, frequency and hematuria.  Musculoskeletal:  Negative for falls and myalgias.  Neurological:  Negative for dizziness.  Psychiatric/Behavioral:  Positive for hallucinations and memory loss.    Negative unless indicated in HPI.   Past Medical History:  Diagnosis Date   Arthritis    Breast mass    left breast   Clotting disorder (HCC)    Coronary atherosclerosis    a. s/p PCTA LAD (1992). b. Last cath in 10/11 with nonobstructive disease, EF 60% by LV-gram.    Dementia (HCC)    Depression  Diverticulosis of colon (without mention of hemorrhage)    Fibromyalgia    Glaucoma    Hemorrhage of rectum and anus    History of uterine cancer    Iron deficiency anemia, unspecified    Leukopenia    chronic--benign   Other pulmonary embolism and infarction    2011   Pure hypercholesterolemia    Thyroid   disease    UTI (lower urinary tract infection)    Past Surgical History:  Procedure Laterality Date   ABDOMINAL HYSTERECTOMY     CARDIAC CATHETERIZATION  07/25/10   luminal irregularities in multiple vessels, no sig stenosis, EF 60%   CATARACT EXTRACTION  2004   right   COLONOSCOPY  07/01/2007   diverticulosis   CORONARY ANGIOPLASTY  1992   LAD   KIDNEY SURGERY     stent placed   NECK SURGERY     thyroid  needle aspiration  12/2007   right, hyperplastic nodule   UPPER GASTROINTESTINAL ENDOSCOPY  07/16/2007   tortous esophagus with spasms   Social History:   reports that she has never smoked. She has never used smokeless tobacco. She reports that she does not drink alcohol and does not use drugs.  Family History  Problem Relation Age of Onset   Diabetes Mother        deceased age 69   Hypertension Mother    Pancreatic cancer Father        deceased age 56   Emphysema Brother        deceased   Alcohol abuse Brother    Osteoporosis Other    Stroke Maternal Aunt    Colon cancer Neg Hx    Heart attack Neg Hx     Medications: Patient's Medications  New Prescriptions   No medications on file  Previous Medications   ASPIRIN  EC 81 MG TABLET    Take 1 tablet (81 mg total) by mouth daily.   DOCUSATE SODIUM  (COLACE) 100 MG CAPSULE    Take 1 capsule (100 mg total) by mouth 2 (two) times daily.   ERGOCALCIFEROL  (VITAMIN D2) 1.25 MG (50000 UT) CAPSULE    Take 1 capsule (50,000 Units total) by mouth every 14 (fourteen) days.   MIDODRINE  (PROAMATINE ) 5 MG TABLET    Take 3 tablets (15 mg total) by mouth daily.   NAMZARIC  7-10 MG CP24    Take 1 capsule by mouth daily.   SIMVASTATIN  (ZOCOR ) 20 MG TABLET    Take 1 tablet (20 mg total) by mouth daily at 6 PM.  Modified Medications   No medications on file  Discontinued Medications   No medications on file    Physical Exam: Vitals:   03/17/24 1452  BP: (!) 90/58  Pulse: 77  Resp: 16  Temp: (!) 96.5 F (35.8 C)  SpO2: 98%   Weight: 123 lb 6.4 oz (56 kg)  Height: 5' 3.75 (1.619 m)   Body mass index is 21.35 kg/m. BP Readings from Last 3 Encounters:  03/17/24 (!) 90/58  12/11/23 100/60  11/15/23 (!) 118/57   Wt Readings from Last 3 Encounters:  03/17/24 123 lb 6.4 oz (56 kg)  12/11/23 127 lb (57.6 kg)  11/15/23 128 lb (58.1 kg)    Physical Exam Constitutional:      Appearance: Normal appearance.  HENT:     Head: Normocephalic and atraumatic.   Cardiovascular:     Rate and Rhythm: Normal rate and regular rhythm.  Pulmonary:     Effort: Pulmonary effort is normal. No  respiratory distress.     Breath sounds: Normal breath sounds. No wheezing.  Abdominal:     General: Bowel sounds are normal. There is no distension.     Tenderness: There is no abdominal tenderness. There is no guarding or rebound.     Comments:     Musculoskeletal:        General: No swelling.   Neurological:     Mental Status: She is alert. Mental status is at baseline.     Motor: No weakness.     Labs reviewed: Basic Metabolic Panel: Recent Labs    11/04/23 2022 12/11/23 1455  NA 139 142  K 4.5 4.4  CL 105 108  CO2 24 31  GLUCOSE 144* 94  BUN 17 19  CREATININE 1.41* 1.17*  CALCIUM  8.7* 9.0   Liver Function Tests: Recent Labs    12/11/23 1455  AST 16  ALT 11  BILITOT 0.4  PROT 6.2   No results for input(s): LIPASE, AMYLASE in the last 8760 hours. No results for input(s): AMMONIA in the last 8760 hours. CBC: Recent Labs    11/04/23 2022  WBC 3.7*  HGB 13.1  HCT 40.2  MCV 96.4  PLT 196   Lipid Panel: No results for input(s): CHOL, HDL, LDLCALC, TRIG, CHOLHDL, LDLDIRECT in the last 8760 hours. TSH: No results for input(s): TSH in the last 8760 hours. A1C: No results found for: HGBA1C  Assessment and Plan Assessment & Plan  1. Acute cough (Primary)  Lungs clear  O2 sat 98% on RA Will order chest x ray  Will order tessalon  - DG Chest 2 View; Future - benzonatate  (TESSALON) 200 MG capsule; Take 1 capsule (200 mg total) by mouth 2 (two) times daily as needed for cough.  Dispense: 20 capsule; Refill: 0  2. Weight loss Stop simvastatin   - Basic Metabolic Panel with eGFR - CBC (no diff)  3. Dizziness  Bp on the low side Pt seems pleasant and comfortable Does not appear to be in distress Cheerful and answers questions during the visit  Instructed daughter to monitor for dizziness or change in mentation  If patient develops any of the above symptoms she needs to go to ED Instructed to drink more water  Will send midodrine  to her pharmacy  - midodrine  (PROAMATINE ) 5 MG tablet; Take 1 tablet (5 mg total) by mouth daily.  Dispense: 90 tablet; Refill: 1

## 2024-03-18 ENCOUNTER — Ambulatory Visit: Payer: Self-pay | Admitting: Sports Medicine

## 2024-03-19 ENCOUNTER — Telehealth: Payer: Self-pay

## 2024-03-23 DIAGNOSIS — M25522 Pain in left elbow: Secondary | ICD-10-CM | POA: Diagnosis not present

## 2024-03-23 DIAGNOSIS — M25571 Pain in right ankle and joints of right foot: Secondary | ICD-10-CM | POA: Diagnosis not present

## 2024-03-23 DIAGNOSIS — M25521 Pain in right elbow: Secondary | ICD-10-CM | POA: Diagnosis not present

## 2024-03-23 DIAGNOSIS — M25572 Pain in left ankle and joints of left foot: Secondary | ICD-10-CM | POA: Diagnosis not present

## 2024-03-30 ENCOUNTER — Telehealth

## 2024-04-29 ENCOUNTER — Other Ambulatory Visit: Payer: Self-pay

## 2024-04-29 DIAGNOSIS — R4181 Age-related cognitive decline: Secondary | ICD-10-CM

## 2024-04-29 DIAGNOSIS — R059 Cough, unspecified: Secondary | ICD-10-CM

## 2024-04-29 NOTE — Patient Outreach (Signed)
 Complex Care Management   Visit Note  04/29/2024  Name:  Erin Roberson MRN: [id number] DOB: Jul 12, 1924  Situation: Referral received for Complex Care Management related to Dementia, Sensorineural hearing loss of both ears, orthostatic dizziness, Falls, sudden coughing spells. I obtained verbal consent from Patient.  Visit completed with patient on the phone.  Background:   Past Medical History:  Diagnosis Date   Arthritis    Breast mass    left breast   Clotting disorder (HCC)    Coronary atherosclerosis    a. s/p PCTA LAD (1992). b. Last cath in 10/11 with nonobstructive disease, EF 60% by LV-gram.    Dementia (HCC)    Depression    Diverticulosis of colon (without mention of hemorrhage)    Fibromyalgia    Glaucoma    Hemorrhage of rectum and anus    History of uterine cancer    Iron deficiency anemia, unspecified    Leukopenia    chronic--benign   Other pulmonary embolism and infarction    2011   Pure hypercholesterolemia    Thyroid  disease    UTI (lower urinary tract infection)     Assessment: Patient Reported Symptoms:  Cognitive Cognitive Status: Unable to Assess Cognitive/Intellectual Conditions Management [RPT]: Other Other: advanced Dementia      Neurological Neurological Review of Symptoms: Headaches Neurological Management Strategies: Routine screening Neurological Self-Management Outcome: 3 (uncertain)  HEENT HEENT Symptoms Reported: Other: (post nasal drip)      Cardiovascular Cardiovascular Symptoms Reported: Dizziness Does patient have uncontrolled Hypertension?: No Cardiovascular Management Strategies: Medication therapy, Routine screening Cardiovascular Self-Management Outcome: 3 (uncertain)  Respiratory Respiratory Symptoms Reported: Productive cough Respiratory Management Strategies: Routine screening  Endocrine Endocrine Symptoms Reported: No symptoms reported Is patient diabetic?: No    Gastrointestinal Gastrointestinal Symptoms Reported:  Change in appetite, Unintentional weight loss      Genitourinary Genitourinary Symptoms Reported: No symptoms reported Genitourinary Management Strategies: Fluid modification Genitourinary Self-Management Outcome: 3 (uncertain)  Integumentary Integumentary Symptoms Reported: No symptoms reported    Musculoskeletal     Falls in the past year?: Yes Number of falls in past year: 2 or more Was there an injury with Fall?: No Fall Risk Category Calculator: 2 Patient Fall Risk Level: Moderate Fall Risk Patient at Risk for Falls Due to: History of fall(s), Impaired mobility Fall risk Follow up: Falls evaluation completed, Falls prevention discussed  Psychosocial Psychosocial Symptoms Reported: No symptoms reported   Major Change/Loss/Stressor/Fears (CP): Denies        04/29/2024    3:08 PM  Depression screen PHQ 2/9  Decreased Interest 0  Down, Depressed, Hopeless 0  PHQ - 2 Score 0    There were no vitals filed for this visit.  Medications Reviewed Today     Reviewed by [name], RN (Registered Nurse) on 04/29/24 at 1433  Med List Status: <None>   Medication Order Taking? Sig Documenting Provider Last Dose Status Informant  aspirin  EC 81 MG tablet [id number] Yes Take 1 tablet (81 mg total) by mouth daily. End, [name], MD  Active   benzonatate  (TESSALON ) 200 MG capsule [id number] Yes Take 1 capsule (200 mg total) by mouth 2 (two) times daily as needed for cough. [name], MD  Active   midodrine  (PROAMATINE ) 5 MG tablet [id number] Yes Take 1 tablet (5 mg total) by mouth daily. [name], MD  Active   NAMZARIC  7-10 MG CP24 [id number] Yes Take 1 capsule by mouth daily. [name], MD  Active  Recommendation:   PCP Follow-up with Dr. Sherlynn on 06/17/24 at 2:40 PM  Follow Up Plan:   Telephone follow up appointment date/time:  Tuesday, September 2 at 11:30 AM  Clayborne Ly RN BSN CCM [place]  [facility], [facility] Nurse Care Coordinator  Direct Dial: [phone] Website: [email or web address]

## 2024-05-01 NOTE — Patient Instructions (Signed)
 Visit Information  Thank you for taking time to visit with me today. Please don't hesitate to contact me if I can be of assistance to you before our next scheduled appointment.  Our next appointment is by telephone on Tuesday, September 2 at 11:30 AM Please call the care guide team at [phone] if you need to cancel or reschedule your appointment.   Following is a copy of your care plan:   Goals Addressed             This Visit's Progress    COMPLETED: VBCI RN Care Plan       See new goal      VBCI RN Care Plan related to Age-Related Cognitive Decline       Problems:  Chronic Disease Management support and education needs related to Age-Related Cognitive Decline   Goal: Over the next 90 days the Patient will continue to work with RN Care Manager and/or Social Worker to address care management and care coordination needs related to Age-Related Cognitive Decline  as evidenced by adherence to care management team scheduled appointments      Interventions:   Evaluation of current treatment plan related to Dementia, self-management and patient's adherence to plan as established by provider Determined patient is showing signs of advanced Dementia per her daughter as evidence by patient is experiencing a decrease in appetite, excessive sleepiness and most recently she is experiencing abrupt coughing spells that her daughter describes as violent coughing that requires some throat clearing once the cough subsides Discussed patient does not have a diagnosis of GERD, daughter denies patient having signs/symptoms of aspiration  Educated daughter about risk for aspiration and discussed potential options for having this symptom evaluated depending on what the PCP recommends Sent an in basket message to Dr. Sherlynn advising of daughter's concerns re: patient's intermittent abrupt and violent coughing spells, worsening whey lying down and requested recommendations Discussed plans with patient  for ongoing care management follow up and provided patient with direct contact information for care management team   Patient Self-Care Activities:  Attend all scheduled provider appointments Call pharmacy for medication refills 3-7 days in advance of running out of medications Call provider office for new concerns or questions  Take medications as prescribed   Work with the social worker to address care coordination needs and will continue to work with the clinical team to address health care and disease management related needs  Plan:  Follow up with Dr. Sherlynn on 06/17/24 at 2:40 PM for a 3 month follow up Telephone follow up appointment with care management team member scheduled for:  Tuesday, September 2 at 11:30 AM          VBCI RN Care Plan related to Impaired Physical Mobility with history of falls       Problems:  Chronic Disease Management support and education needs related to Impaired Physical Mobility with history of Falls   Goal: Over the next 90 days the Patient will continue to work with Medical illustrator and/or Social Worker to address care management and care coordination needs related to Impaired Physical Mobility with history of Falls  as evidenced by adherence to care management team scheduled appointments      Interventions:   Falls Interventions: Provided verbal education re: potential causes of falls and Fall prevention strategies Advised patient of importance of notifying provider of falls Assessed for signs and symptoms of orthostatic hypotension Assessed for DME needs  Patient Self-Care Activities:  Attend all scheduled  provider appointments Call pharmacy for medication refills 3-7 days in advance of running out of medications Call provider office for new concerns or questions  Take medications as prescribed   Work with the nurse care manager to address care coordination needs and will continue to work with the clinical team to address health care and  disease management related needs  Plan:  Telephone follow up appointment with care management team member scheduled for:  Tuesday, September 2 at 11:30 AM             Please call 1-800-273-TALK (toll free, 24 hour hotline) if you are experiencing a Mental Health or Behavioral Health Crisis or need someone to talk to.  Patient verbalizes understanding of instructions and care plan provided today and agrees to view in MyChart. Active MyChart status and patient understanding of how to access instructions and care plan via MyChart confirmed with patient.     Clayborne Ly RN BSN CCM [place]  [facility], [facility] Health Nurse Care Coordinator  Direct Dial: [phone] Website: [email or web address]

## 2024-05-06 ENCOUNTER — Telehealth: Payer: Self-pay

## 2024-05-06 NOTE — Progress Notes (Signed)
   Telephone encounter was:  Successful.  Complex Care Management Note Care Guide Note  05/06/2024 Name: Erin Roberson MRN: [id number] DOB: [birth year]  Erin Roberson is a 89 y.o. year old female who is a primary care patient of [name], MD . The community resource team was consulted for assistance with Home Modifications  SDOH screenings and interventions completed:  No        Care guide performed the following interventions: Patient will call back  Follow Up Plan:  Care guide will follow up with patient by phone over the next day  Encounter Outcome:  Patient Request to Call Back    Jon Colt [facility]  [facility] Guide, Phone: [phone] Fax: [phone] Website: [email or web address]

## 2024-05-08 ENCOUNTER — Telehealth: Payer: Self-pay

## 2024-05-08 NOTE — Progress Notes (Signed)
   Telephone encounter was:  Unsuccessful.  05/08/2024 Name: SAAVI MCEACHRON MRN: [id number] DOB: [birth year]  Unsuccessful outbound call made today to assist with:  Home repairs  Outreach Attempt:  2nd Attempt  No answer and unable to leave a message    Jon Colt [facility]  [facility] Guide, Phone: [phone] Fax: [phone] Website: [email or web address]

## 2024-05-12 ENCOUNTER — Telehealth: Payer: Self-pay

## 2024-05-12 NOTE — Progress Notes (Signed)
   Telephone encounter was:  Unsuccessful.  05/12/2024 Name: CAILEN TEXEIRA MRN: [id number] DOB: 1923/12/03  Unsuccessful outbound call made today to assist with:  Home Modifications  Outreach Attempt:  3rd Attempt.  Referral closed unable to contact patient.  No answer and unable to leave a message   Jon Colt [facility] Guide, Phone: [phone] Fax: [phone] Website: [email or web address]

## 2024-06-02 ENCOUNTER — Other Ambulatory Visit: Payer: Self-pay

## 2024-06-02 DIAGNOSIS — H34812 Central retinal vein occlusion, left eye, with macular edema: Secondary | ICD-10-CM | POA: Diagnosis not present

## 2024-06-02 DIAGNOSIS — Z961 Presence of intraocular lens: Secondary | ICD-10-CM | POA: Diagnosis not present

## 2024-06-02 DIAGNOSIS — H43813 Vitreous degeneration, bilateral: Secondary | ICD-10-CM | POA: Diagnosis not present

## 2024-06-02 DIAGNOSIS — H35373 Puckering of macula, bilateral: Secondary | ICD-10-CM | POA: Diagnosis not present

## 2024-06-03 NOTE — Patient Instructions (Signed)
 Visit Information  Thank you for taking time to visit with me today. Please don't hesitate to contact me if I can be of assistance to you before our next scheduled appointment.  Your next care management appointment is by telephone on Tuesday, September 23 at 12:15 PM  Please call the care guide team at [phone] if you need to cancel, schedule, or reschedule an appointment.   Please call 1-800-273-TALK (toll free, 24 hour hotline) if you are experiencing a Mental Health or Behavioral Health Crisis or need someone to talk to.  Clayborne Ly RN BSN CCM [place]  [facility], [facility] Nurse Care Coordinator  Direct Dial: [phone] Website: [email or web address]

## 2024-06-03 NOTE — Patient Outreach (Signed)
 Complex Care Management   Visit Note  06/03/2024  Name:  Erin Roberson MRN: [id number] DOB: 02-Jun-1924  Situation: Referral received for Complex Care Management related to Dementia, Sensorineural hearing loss of both ears, orthostatic dizziness, Falls, sudden coughing spells.  I obtained verbal consent from Caregiver.  Visit completed with Caregiver daughter Erin Roberson on the phone.  Background:   Past Medical History:  Diagnosis Date   Arthritis    Breast mass    left breast   Clotting disorder (HCC)    Coronary atherosclerosis    a. s/p PCTA LAD (1992). b. Last cath in 10/11 with nonobstructive disease, EF 60% by LV-gram.    Dementia (HCC)    Depression    Diverticulosis of colon (without mention of hemorrhage)    Fibromyalgia    Glaucoma    Hemorrhage of rectum and anus    History of uterine cancer    Iron deficiency anemia, unspecified    Leukopenia    chronic--benign   Other pulmonary embolism and infarction    2011   Pure hypercholesterolemia    Thyroid  disease    UTI (lower urinary tract infection)     Assessment: Patient Reported Symptoms:  Cognitive Cognitive Status: Confused or disoriented, Struggling with memory recall Cognitive/Intellectual Conditions Management [RPT]: Other Other: Age-related cognitive decline      Neurological Neurological Review of Symptoms: Not assessed    HEENT HEENT Symptoms Reported: Not assessed      Cardiovascular Cardiovascular Symptoms Reported: Dizziness Does patient have uncontrolled Hypertension?: No Cardiovascular Management Strategies: Routine screening, Adequate rest Cardiovascular Self-Management Outcome: 4 (good)  Respiratory Respiratory Symptoms Reported: Dry cough Respiratory Management Strategies: Routine screening Respiratory Self-Management Outcome: 2 (bad)  Endocrine Endocrine Symptoms Reported: Not assessed    Gastrointestinal Gastrointestinal Symptoms Reported: Reflux/heartburn Gastrointestinal  Self-Management Outcome: 3 (uncertain) Gastrointestinal Comment: Educated daughter re: signs/symptoms suggestive of GERD; educated re: tips to manage GERD; dtr will discuss with PCP at next schecduled visit    Genitourinary Genitourinary Symptoms Reported: No symptoms reported    Integumentary Integumentary Symptoms Reported: Other Other Integumentary Symptoms: dry skin Skin Management Strategies:  (moisturizer) Skin Self-Management Outcome: 4 (good)  Musculoskeletal Musculoskelatal Symptoms Reviewed: Limited mobility, Unsteady gait Musculoskeletal Management Strategies: Adequate rest, Medical device, Routine screening Musculoskeletal Self-Management Outcome: 4 (good) Falls in the past year?: Yes Number of falls in past year: 1 or less Was there an injury with Fall?: Yes Fall Risk Category Calculator: 2 Patient Fall Risk Level: Moderate Fall Risk Patient at Risk for Falls Due to: History of fall(s), Impaired balance/gait, Impaired mobility Fall risk Follow up: Falls evaluation completed, Education provided, Falls prevention discussed  Psychosocial Psychosocial Symptoms Reported: No symptoms reported     Quality of Family Relationships: helpful, involved, supportive Do you feel physically threatened by others?: No    06/03/2024    PHQ2-9 Depression Screening   Erin Roberson interest or pleasure in doing things    Feeling down, depressed, or hopeless    PHQ-2 - Total Score    Trouble falling or staying asleep, or sleeping too much    Feeling tired or having Erin Roberson energy    Poor appetite or overeating     Feeling bad about yourself - or that you are a failure or have let yourself or your family down    Trouble concentrating on things, such as reading the newspaper or watching television    Moving or speaking so slowly that other people could have noticed.  Or the opposite - being so  fidgety or restless that you have been moving around a lot more than usual    Thoughts that you would be  better off dead, or hurting yourself in some way    PHQ2-9 Total Score    If you checked off any problems, how difficult have these problems made it for you to do your work, take care of things at home, or get along with other people    Depression Interventions/Treatment      There were no vitals filed for this visit.  Medications Reviewed Today     Reviewed by Erin Roberson CROME, RN (Registered Nurse) on 06/02/24 at 1144  Med List Status: <None>   Medication Order Taking? Sig Documenting Provider Last Dose Status Informant  aspirin  EC 81 MG tablet [id number]  Take 1 tablet (81 mg total) by mouth daily. Roberson, [name], MD  Active   benzonatate  (TESSALON ) 200 MG capsule [id number]  Take 1 capsule (200 mg total) by mouth 2 (two) times daily as needed for cough. Erin Madden, MD  Active   midodrine  (PROAMATINE ) 5 MG tablet [id number]  Take 1 tablet (5 mg total) by mouth daily. Erin Madden, MD  Active   NAMZARIC  7-10 MG CP24 [id number]  Take 1 capsule by mouth daily. Erin Madden, MD  Active             Recommendation:   PCP Follow-up  Follow Up Plan:   Telephone follow up appointment date/time:  Tuesday, September 23 at 12:15 PM  Erin Morgan RN BSN CCM [place]  [facility], [facility] Nurse Care Coordinator  Direct Dial: [phone] Website: [email or web address]

## 2024-06-03 NOTE — Patient Outreach (Unsigned)
 Signed      Complex Care Management    Visit Note   06/03/2024   Name:  Erin Roberson MRN: [id number]       DOB: August 21, 1924   Situation: Referral received for Complex Care Management related to Dementia, Sensorineural hearing loss of both ears, orthostatic dizziness, Falls, sudden coughing spells.  I obtained verbal consent from Caregiver.  Visit completed with Caregiver daughter [name] on the phone.   Background:       Past Medical History:  Diagnosis Date   Arthritis     Breast mass      left breast   Clotting disorder (HCC)     Coronary atherosclerosis      a. s/p PCTA LAD (1992). b. Last cath in 10/11 with nonobstructive disease, EF 60% by LV-gram.    Dementia (HCC)     Depression     Diverticulosis of colon (without mention of hemorrhage)     Fibromyalgia     Glaucoma     Hemorrhage of rectum and anus     History of uterine cancer     Iron deficiency anemia, unspecified     Leukopenia      chronic--benign   Other pulmonary embolism and infarction      2011   Pure hypercholesterolemia     Thyroid  disease     UTI (lower urinary tract infection)            Assessment: Patient Reported Symptoms:   Cognitive Cognitive Status: Confused or disoriented, Struggling with memory recall Cognitive/Intellectual Conditions Management [RPT]: Other Other: Age-related cognitive decline  Neurological Neurological Review of Symptoms: Not assessed  HEENT HEENT Symptoms Reported: Not assessed  Cardiovascular Cardiovascular Symptoms Reported: Dizziness Does patient have uncontrolled Hypertension?: No Cardiovascular Management Strategies: Routine screening, Adequate rest Cardiovascular Self-Management Outcome: 4 (good)  Respiratory Respiratory Symptoms Reported: Dry cough Respiratory Management Strategies: Routine screening Respiratory Self-Management Outcome: 2 (bad)  Endocrine Endocrine Symptoms Reported: Not assessed  Gastrointestinal Gastrointestinal Symptoms Reported:  Reflux/heartburn Gastrointestinal Self-Management Outcome: 3 (uncertain) Gastrointestinal Comment: Educated daughter re: signs/symptoms suggestive of GERD; educated re: tips to manage GERD; dtr will discuss with PCP at next schecduled visit  Genitourinary Genitourinary Symptoms Reported: No symptoms reported  Integumentary Integumentary Symptoms Reported: Other Other Integumentary Symptoms: dry skin Skin Management Strategies:  (moisturizer) Skin Self-Management Outcome: 4 (good)  Musculoskeletal Musculoskelatal Symptoms Reviewed: Limited mobility, Unsteady gait Musculoskeletal Management Strategies: Adequate rest, Medical device, Routine screening Musculoskeletal Self-Management Outcome: 4 (good) Falls in the past year?: Yes Number of falls in past year: 1 or less Was there an injury with Fall?: Yes Fall Risk Category Calculator: 2 Patient Fall Risk Level: Moderate Fall Risk Patient at Risk for Falls Due to: History of fall(s), Impaired balance/gait, Impaired mobility Fall risk Follow up: Falls evaluation completed, Education provided, Falls prevention discussed  Psychosocial Psychosocial Symptoms Reported: No symptoms reported Quality of Family Relationships: helpful, involved, supportive Do you feel physically threatened by others?: No      06/03/2024     PHQ2-9 Depression Screening    Tonna Palazzi interest or pleasure in doing things   Feeling down, depressed, or hopeless   PHQ-2 - Total Score   Trouble falling or staying asleep, or sleeping too much   Feeling tired or having Emmali Karow energy   Poor appetite or overeating    Feeling bad about yourself - or that you are a failure or have let yourself or your family down   Trouble concentrating on things, such  as reading the newspaper or watching television   Moving or speaking so slowly that other people could have noticed.  Or the opposite - being so fidgety or restless that you have been moving around a lot more than usual   Thoughts that  you would be better off dead, or hurting yourself in some way   PHQ2-9 Total Score   If you checked off any problems, how difficult have these problems made it for you to do your work, take care of things at home, or get along with other people   Depression Interventions/Treatment       There were no vitals filed for this visit.   Medications Reviewed Today       Reviewed by [name], RN (Registered Nurse) on 06/02/24 at 1144  Med List Status: <None>    Medication Order Taking? Sig Documenting Provider Last Dose Status Informant  aspirin  EC 81 MG tablet [id number]   Take 1 tablet (81 mg total) by mouth daily. End, [name], MD   Active    benzonatate  (TESSALON ) 200 MG capsule [id number]   Take 1 capsule (200 mg total) by mouth 2 (two) times daily as needed for cough. [name], MD   Active    midodrine  (PROAMATINE ) 5 MG tablet [id number]   Take 1 tablet (5 mg total) by mouth daily. [name], MD   Active    NAMZARIC  7-10 MG CP24 [id number]   Take 1 capsule by mouth daily. [name], MD   Active                    Recommendation:   PCP Follow-up   Follow Up Plan:   Telephone follow up appointment date/time:  Tuesday, September 23 at 12:15 PM   [name] [place]  [facility], [facility] Nurse Care Coordinator  Direct Dial: [phone] Website: [email or web address]              Electronically signed by [name], RN at 06/03/2024  4:45 PM

## 2024-06-17 ENCOUNTER — Ambulatory Visit: Admitting: Sports Medicine

## 2024-06-23 ENCOUNTER — Other Ambulatory Visit: Payer: Self-pay

## 2024-06-23 NOTE — Patient Outreach (Signed)
 Complex Care Management   Visit Note  06/23/2024  Name:  Erin Roberson MRN: [id number] DOB: 02/15/1924  Situation: Referral received for Complex Care Management related to Dementia, Sensorineural hearing loss of both ears, orthostatic dizziness, Falls, sudden coughing spells. I obtained verbal consent from Caregiver.  Visit completed with Guardian/daughter [name] on the phone.  Background:   Past Medical History:  Diagnosis Date   Arthritis    Breast mass    left breast   Clotting disorder    Coronary atherosclerosis    a. s/p PCTA LAD (1992). b. Last cath in 10/11 with nonobstructive disease, EF 60% by LV-gram.    Dementia (HCC)    Depression    Diverticulosis of colon (without mention of hemorrhage)    Fibromyalgia    Glaucoma    Hemorrhage of rectum and anus    History of uterine cancer    Iron deficiency anemia, unspecified    Leukopenia    chronic--benign   Other pulmonary embolism and infarction    2011   Pure hypercholesterolemia    Thyroid  disease    UTI (lower urinary tract infection)     Assessment: Patient Reported Symptoms:  Cognitive Cognitive Status: Confused or disoriented, Struggling with memory recall, Requires Assistance Decision Making Cognitive/Intellectual Conditions Management [RPT]: Other Other: Age-related cognitive decline      Neurological Neurological Review of Symptoms: Not assessed    HEENT HEENT Symptoms Reported: Not assessed      Cardiovascular Cardiovascular Symptoms Reported: Other: Other Cardiovascular Symptoms: hypotension Cardiovascular Management Strategies: Adequate rest, Routine screening Cardiovascular Self-Management Outcome: 3 (uncertain) Cardiovascular Comment: daughter reports patient experienced atleast 1 low BP reading of 93/60  Respiratory Respiratory Symptoms Reported: Not assesed    Endocrine Endocrine Symptoms Reported: Not assessed    Gastrointestinal Gastrointestinal Symptoms Reported: Not assessed       Genitourinary Genitourinary Symptoms Reported: Not assessed    Integumentary Integumentary Symptoms Reported: Not assessed    Musculoskeletal Musculoskelatal Symptoms Reviewed: Not assessed        Psychosocial Psychosocial Symptoms Reported: Not assessed     Quality of Family Relationships: helpful, involved, supportive    06/23/2024    PHQ2-9 Depression Screening   Keir Foland interest or pleasure in doing things    Feeling down, depressed, or hopeless    PHQ-2 - Total Score    Trouble falling or staying asleep, or sleeping too much    Feeling tired or having Jackey Housey energy    Poor appetite or overeating     Feeling bad about yourself - or that you are a failure or have let yourself or your family down    Trouble concentrating on things, such as reading the newspaper or watching television    Moving or speaking so slowly that other people could have noticed.  Or the opposite - being so fidgety or restless that you have been moving around a lot more than usual    Thoughts that you would be better off dead, or hurting yourself in some way    PHQ2-9 Total Score    If you checked off any problems, how difficult have these problems made it for you to do your work, take care of things at home, or get along with other people    Depression Interventions/Treatment      There were no vitals filed for this visit.  Medications Reviewed Today     Reviewed by [name], RN (Registered Nurse) on 06/23/24 at 1318  Med List Status: <None>  Medication Order Taking? Sig Documenting Provider Last Dose Status Informant  aspirin  EC 81 MG tablet [id number]  Take 1 tablet (81 mg total) by mouth daily. End, [name], MD  Active   benzonatate  (TESSALON ) 200 MG capsule [id number]  Take 1 capsule (200 mg total) by mouth 2 (two) times daily as needed for cough. [name], MD  Active   midodrine  (PROAMATINE ) 5 MG tablet [id number]  Take 1 tablet (5 mg total) by mouth daily. [name], MD  Active   NAMZARIC  7-10 MG CP24 [id number]  Take 1 capsule by mouth daily. [name], MD  Active             Recommendation:   PCP Follow-up with Dr. Sherlynn on 06/30/24 at 2:20 PM  Follow Up Plan:   Telephone follow up appointment date/time:  Friday, October 3 at 2:30 PM  Clayborne Ly RN BSN CCM [place]  [facility], [facility] : [facility] Health Nurse Care Coordinator  Direct Dial: [phone] Website: [email or web address]

## 2024-06-23 NOTE — Patient Instructions (Signed)
 Visit Information  Thank you for taking time to visit with me today. Please don't hesitate to contact me if I can be of assistance to you before our next scheduled appointment.  Your next care management appointment is by telephone on Friday, October 3 at 2:30 PM  Please call the care guide team at [phone] if you need to cancel, schedule, or reschedule an appointment.   Please call 1-800-273-TALK (toll free, 24 hour hotline) if you are experiencing a Mental Health or Behavioral Health Crisis or need someone to talk to.  Clayborne Ly RN BSN CCM [place]  [facility], [facility] Health Nurse Care Coordinator  Direct Dial: [phone] Website: [email or web address]

## 2024-06-30 ENCOUNTER — Ambulatory Visit: Admitting: Sports Medicine

## 2024-06-30 ENCOUNTER — Encounter: Payer: Self-pay | Admitting: Sports Medicine

## 2024-06-30 VITALS — BP 100/60 | HR 70 | Temp 96.0°F | Resp 16 | Ht 63.75 in | Wt 125.6 lb

## 2024-06-30 DIAGNOSIS — I959 Hypotension, unspecified: Secondary | ICD-10-CM

## 2024-06-30 DIAGNOSIS — F0390 Unspecified dementia without behavioral disturbance: Secondary | ICD-10-CM

## 2024-06-30 DIAGNOSIS — R252 Cramp and spasm: Secondary | ICD-10-CM | POA: Diagnosis not present

## 2024-06-30 DIAGNOSIS — E559 Vitamin D deficiency, unspecified: Secondary | ICD-10-CM

## 2024-06-30 DIAGNOSIS — Z23 Encounter for immunization: Secondary | ICD-10-CM | POA: Diagnosis not present

## 2024-06-30 MED ORDER — MIDODRINE HCL 5 MG PO TABS
5.0000 mg | ORAL_TABLET | Freq: Two times a day (BID) | ORAL | 1 refills | Status: AC
Start: 2024-06-30 — End: ?

## 2024-06-30 NOTE — Progress Notes (Unsigned)
 Careteam: Patient Care Team: [name], MD as PCP - General (Internal Medicine) End, [name], MD as PCP - Cardiology (Cardiology) Morris Debby BIRCH, MD as Referring Physician (Cardiology) Rosalynn LELON Ingle, MD (Inactive) as Attending Physician (Obstetrics and Gynecology) [name], RN as VBCI Care Management  PLACE OF SERVICE:  [facility]  Advanced Directive information    No Known Allergies  Chief Complaint  Patient presents with   Medical Management of Chronic Issues    3 month follow up. Discuss the need for Influenza vaccine, Covid Booster, Shingrix vaccine, Dexa scan, and AWV.     Discussed the use of AI scribe software for clinical note transcription with the patient, who gave verbal consent to proceed.  History of Present Illness    Erin Roberson is a 89 year old female who presents with severe leg cramps. She is accompanied by her daughter, Zilpha Mcandrew.  She has been experiencing severe cramps in her left leg for the past two to three weeks. The pain occurs approximately every four days and has been severe enough to cause her to scream on three occasions. Each episode lasts about six to seven minutes and then dissipates. There is no associated swelling or redness in the leg, and she denies pain in her knee or hips.  She  is able to perform daily activities such as washing dishes, making her bed, and picking out her clothes. She does not cook but can warm food in the microwave.  She experiences occasional dizziness but no chest pain, abdominal pain, or pain during urination. She has not experienced any recent falls, although she has fallen four times this year. Her mood is stable, and she does not feel depressed. She is oriented to person, place, and time, knowing her name, her daughter's name, and the current year.  Her breathing is not problematic, but she has significant hearing loss. She has tried hearing aids in the past without success. Her  headaches, which used to occur every other day, have not been a recent issue.  Review of Systems:  Review of Systems  Constitutional:  Negative for chills and fever.  HENT:  Negative for sore throat.   Respiratory:  Negative for cough, sputum production and shortness of breath.   Cardiovascular:  Negative for chest pain, palpitations and leg swelling.  Gastrointestinal:  Negative for abdominal pain, heartburn and nausea.  Genitourinary:  Negative for dysuria, frequency and hematuria.  Musculoskeletal:  Positive for myalgias. Negative for falls.  Neurological:  Positive for dizziness.  Psychiatric/Behavioral:  Positive for memory loss.    Negative unless indicated in HPI.   Past Medical History:  Diagnosis Date   Arthritis    Breast mass    left breast   Clotting disorder    Coronary atherosclerosis    a. s/p PCTA LAD (1992). b. Last cath in 10/11 with nonobstructive disease, EF 60% by LV-gram.    Dementia (HCC)    Depression    Diverticulosis of colon (without mention of hemorrhage)    Fibromyalgia    Glaucoma    Hemorrhage of rectum and anus    History of uterine cancer    Iron deficiency anemia, unspecified    Leukopenia    chronic--benign   Other pulmonary embolism and infarction    2011   Pure hypercholesterolemia    Thyroid  disease    UTI (lower urinary tract infection)    Past Surgical History:  Procedure Laterality Date   ABDOMINAL HYSTERECTOMY  CARDIAC CATHETERIZATION  07/25/10   luminal irregularities in multiple vessels, no sig stenosis, EF 60%   CATARACT EXTRACTION  2004   right   COLONOSCOPY  07/01/2007   diverticulosis   CORONARY ANGIOPLASTY  1992   LAD   KIDNEY SURGERY     stent placed   NECK SURGERY     thyroid  needle aspiration  12/2007   right, hyperplastic nodule   UPPER GASTROINTESTINAL ENDOSCOPY  07/16/2007   tortous esophagus with spasms   Social History:   reports that she has never smoked. She has never used smokeless tobacco. She  reports that she does not drink alcohol and does not use drugs.  Family History  Problem Relation Age of Onset   Diabetes Mother        deceased age 40   Hypertension Mother    Pancreatic cancer Father        deceased age 42   Emphysema Brother        deceased   Alcohol abuse Brother    Osteoporosis Other    Stroke Maternal Aunt    Colon cancer Neg Hx    Heart attack Neg Hx     Medications: Patient's Medications  New Prescriptions   No medications on file  Previous Medications   ASPIRIN  EC 81 MG TABLET    Take 1 tablet (81 mg total) by mouth daily.   BENZONATATE  (TESSALON ) 200 MG CAPSULE    Take 1 capsule (200 mg total) by mouth 2 (two) times daily as needed for cough.   MIDODRINE  (PROAMATINE ) 5 MG TABLET    Take 1 tablet (5 mg total) by mouth daily.   NAMZARIC  7-10 MG CP24    Take 1 capsule by mouth daily.   VITAMIN D , ERGOCALCIFEROL , (DRISDOL ) 1.25 MG (50000 UNIT) CAPS CAPSULE    Take 50,000 Units by mouth every 14 (fourteen) days.  Modified Medications   No medications on file  Discontinued Medications   No medications on file    Physical Exam: Vitals:   06/30/24 1439  BP: (!) 90/58  Pulse: 70  Resp: 16  Temp: (!) 96 F (35.6 C)  SpO2: 99%  Weight: 125 lb 9.6 oz (57 kg)  Height: 5' 3.75 (1.619 m)   Body mass index is 21.73 kg/m. BP Readings from Last 3 Encounters:  06/30/24 (!) 90/58  03/17/24 (!) 80/60  12/11/23 100/60   Wt Readings from Last 3 Encounters:  06/30/24 125 lb 9.6 oz (57 kg)  03/17/24 123 lb 6.4 oz (56 kg)  12/11/23 127 lb (57.6 kg)    Physical Exam Constitutional:      Appearance: Normal appearance.  HENT:     Head: Normocephalic and atraumatic.  Cardiovascular:     Rate and Rhythm: Normal rate and regular rhythm.  Pulmonary:     Effort: Pulmonary effort is normal. No respiratory distress.     Breath sounds: Normal breath sounds. No wheezing.  Abdominal:     General: Bowel sounds are normal. There is no distension.      Tenderness: There is no abdominal tenderness. There is no guarding or rebound.     Comments:    Musculoskeletal:        General: No swelling.  Neurological:     Mental Status: She is alert. Mental status is at baseline.     Motor: No weakness.     Labs reviewed: Basic Metabolic Panel: Recent Labs    11/04/23 2022 12/11/23 1455 03/17/24 1515  NA 139 142  142  K 4.5 4.4 4.2  CL 105 108 107  CO2 24 31 28   GLUCOSE 144* 94 71  BUN 17 19 30*  CREATININE 1.41* 1.17* 1.13*  CALCIUM  8.7* 9.0 9.0   Liver Function Tests: Recent Labs    12/11/23 1455  AST 16  ALT 11  BILITOT 0.4  PROT 6.2   No results for input(s): LIPASE, AMYLASE in the last 8760 hours. No results for input(s): AMMONIA in the last 8760 hours. CBC: Recent Labs    11/04/23 2022 03/17/24 1515  WBC 3.7* 3.8  HGB 13.1 12.0  HCT 40.2 36.8  MCV 96.4 96.1  PLT 196 227   Lipid Panel: No results for input(s): CHOL, HDL, LDLCALC, TRIG, CHOLHDL, LDLDIRECT in the last 8760 hours. TSH: No results for input(s): TSH in the last 8760 hours. A1C: No results found for: HGBA1C  Assessment and Plan Assessment & Plan   1. Immunization due (Primary) ***  2. Hypotension, unspecified hypotension type *** - midodrine  (PROAMATINE ) 5 MG tablet; Take 1 tablet (5 mg total) by mouth 2 (two) times daily.  Dispense: 90 tablet; Refill: 1 - CBC (no diff) - Basic Metabolic Panel  3. Major neurocognitive disorder (HCC) ***  4. Muscle cramps *** - Magnesium  5. Vitamin D  deficiency *** - Vitamin D , 25-hydroxy  Other orders - Vitamin D , Ergocalciferol , (DRISDOL ) 1.25 MG (50000 UNIT) CAPS capsule; Take 50,000 Units by mouth every 14 (fourteen) days.     No follow-ups on file.:   Camela Wich

## 2024-06-30 NOTE — Patient Instructions (Signed)
 Take magnesium supplements over the counter

## 2024-07-01 ENCOUNTER — Other Ambulatory Visit

## 2024-07-01 DIAGNOSIS — E559 Vitamin D deficiency, unspecified: Secondary | ICD-10-CM | POA: Diagnosis not present

## 2024-07-01 DIAGNOSIS — I959 Hypotension, unspecified: Secondary | ICD-10-CM | POA: Diagnosis not present

## 2024-07-01 DIAGNOSIS — R252 Cramp and spasm: Secondary | ICD-10-CM | POA: Diagnosis not present

## 2024-07-01 LAB — CBC
HCT: 36.4 % (ref 35.0–45.0)
Hemoglobin: 12.2 g/dL (ref 11.7–15.5)
MCH: 32.4 pg (ref 27.0–33.0)
MCHC: 33.5 g/dL (ref 32.0–36.0)
MCV: 96.8 fL (ref 80.0–100.0)
MPV: 10.1 fL (ref 7.5–12.5)
Platelets: 189 Thousand/uL (ref 140–400)
RBC: 3.76 Million/uL — ABNORMAL LOW (ref 3.80–5.10)
RDW: 12.3 % (ref 11.0–15.0)
WBC: 3.6 Thousand/uL — ABNORMAL LOW (ref 3.8–10.8)

## 2024-07-01 LAB — VITAMIN D 25 HYDROXY (VIT D DEFICIENCY, FRACTURES): Vit D, 25-Hydroxy: 36 ng/mL (ref 30–100)

## 2024-07-01 LAB — BASIC METABOLIC PANEL WITH GFR
BUN/Creatinine Ratio: 21 (calc) (ref 6–22)
BUN: 23 mg/dL (ref 7–25)
CO2: 27 mmol/L (ref 20–32)
Calcium: 8.9 mg/dL (ref 8.6–10.4)
Chloride: 107 mmol/L (ref 98–110)
Creat: 1.07 mg/dL — ABNORMAL HIGH (ref 0.60–0.95)
Glucose, Bld: 99 mg/dL (ref 65–139)
Potassium: 4 mmol/L (ref 3.5–5.3)
Sodium: 141 mmol/L (ref 135–146)
eGFR: 47 mL/min/1.73m2 — ABNORMAL LOW (ref >=60)

## 2024-07-01 LAB — MAGNESIUM: Magnesium: 2.2 mg/dL (ref 1.5–2.5)

## 2024-07-02 ENCOUNTER — Ambulatory Visit: Payer: Self-pay | Admitting: Sports Medicine

## 2024-07-03 ENCOUNTER — Other Ambulatory Visit: Payer: Self-pay

## 2024-07-03 DIAGNOSIS — F03918 Unspecified dementia, unspecified severity, with other behavioral disturbance: Secondary | ICD-10-CM

## 2024-07-03 NOTE — Patient Outreach (Signed)
 Complex Care Management   Visit Note  07/03/2024  Name:  Erin Roberson MRN: [id number] DOB: 1924/08/25  Situation: Referral received for Complex Care Management related to Dementia, Sensorineural hearing loss of both ears, orthostatic dizziness, Falls, sudden coughing spells. I obtained verbal consent from Patient.  Visit completed with Patient on the phone.  Background:   Past Medical History:  Diagnosis Date   Arthritis    Breast mass    left breast   Clotting disorder    Coronary atherosclerosis    a. s/p PCTA LAD (1992). b. Last cath in 10/11 with nonobstructive disease, EF 60% by LV-gram.    Dementia (HCC)    Depression    Diverticulosis of colon (without mention of hemorrhage)    Fibromyalgia    Glaucoma    Hemorrhage of rectum and anus    History of uterine cancer    Iron deficiency anemia, unspecified    Leukopenia    chronic--benign   Other pulmonary embolism and infarction    2011   Pure hypercholesterolemia    Thyroid  disease    UTI (lower urinary tract infection)     Assessment: Patient Reported Symptoms:  Cognitive Cognitive Status: Struggling with memory recall, Requires Assistance Decision Making (completed call with daughter Erin Roberson) Cognitive/Intellectual Conditions Management [RPT]: Other Other: Age-related cognitive decline   Health Maintenance Behaviors: Annual physical exam Health Facilitated by: Rest  Neurological Neurological Review of Symptoms: Hearing changes Neurological Management Strategies: Routine screening Neurological Self-Management Outcome: 3 (uncertain)  HEENT HEENT Symptoms Reported: No symptoms reported      Cardiovascular Cardiovascular Symptoms Reported: Other: Other Cardiovascular Symptoms: hypotension Does patient have uncontrolled Hypertension?: No Cardiovascular Management Strategies: Medication therapy, Adequate rest, Routine screening, Fluid modification Cardiovascular Self-Management Outcome: 4 (good) Cardiovascular  Comment: reviewed PCP recommendations with daughter Erin Roberson  Respiratory Respiratory Symptoms Reported: Dry cough Other Respiratory Symptoms: daughter reports patient's recent cough has subsided but occurs occassionally Respiratory Management Strategies: Routine screening Respiratory Self-Management Outcome: 4 (good)  Endocrine Endocrine Symptoms Reported: No symptoms reported Is patient diabetic?: No    Gastrointestinal Gastrointestinal Symptoms Reported: Constipation Gastrointestinal Management Strategies: Medication therapy Gastrointestinal Self-Management Outcome: 4 (good)    Genitourinary Genitourinary Symptoms Reported: Incontinence Genitourinary Management Strategies: Incontinence garment/pad Genitourinary Self-Management Outcome: 3 (uncertain)  Integumentary Integumentary Symptoms Reported: No symptoms reported    Musculoskeletal Musculoskelatal Symptoms Reviewed: Limited mobility, Unsteady gait, Difficulty walking, Weakness Musculoskeletal Management Strategies: Medical device, Routine screening Musculoskeletal Self-Management Outcome: 4 (good)      Psychosocial Psychosocial Symptoms Reported: No symptoms reported   Major Change/Loss/Stressor/Fears (CP): Denies Quality of Family Relationships: helpful, involved, supportive Do you feel physically threatened by others?: No    07/03/2024    PHQ2-9 Depression Screening   Erin Roberson interest or pleasure in doing things    Feeling down, depressed, or hopeless    PHQ-2 - Total Score    Trouble falling or staying asleep, or sleeping too much    Feeling tired or having Erin Roberson energy    Poor appetite or overeating     Feeling bad about yourself - or that you are a failure or have let yourself or your family down    Trouble concentrating on things, such as reading the newspaper or watching television    Moving or speaking so slowly that other people could have noticed.  Or the opposite - being so fidgety or restless that you have been  moving around a lot more than usual    Thoughts that you would be better  off dead, or hurting yourself in some way    PHQ2-9 Total Score    If you checked off any problems, how difficult have these problems made it for you to do your work, take care of things at home, or get along with other people    Depression Interventions/Treatment      There were no vitals filed for this visit.  Medications Reviewed Today     Reviewed by Erin Roberson CROME, RN (Registered Nurse) on 07/03/24 at 1442  Med List Status: <None>   Medication Order Taking? Sig Documenting Provider Last Dose Status Informant  aspirin  EC 81 MG tablet [id number]  Take 1 tablet (81 mg total) by mouth daily. Roberson, [name], MD  Active   midodrine  (PROAMATINE ) 5 MG tablet [id number]  Take 1 tablet (5 mg total) by mouth 2 (two) times daily. Erin Madden, MD  Active   NAMZARIC  7-10 MG CP24 [id number]  Take 1 capsule by mouth daily. Erin Madden, MD  Active   Vitamin D , Ergocalciferol , (DRISDOL ) 1.25 MG (50000 UNIT) CAPS capsule [id number]  Take 50,000 Units by mouth every 14 (fourteen) days. [provider]  Active             Recommendation:   PCP Follow-up with Erin Plough NP on 09/29/24 at 3:00 PM  Follow Up Plan:   Telephone follow up appointment date/time:  Friday, October 31 at 2:00 PM  Roberson Morgan RN BSN CCM Erin Roberson  [facility], [facility] Health Nurse Care Coordinator  Direct Dial: [phone] Website: [email or web address]

## 2024-07-03 NOTE — Patient Instructions (Signed)
 Visit Information  Thank you for taking time to visit with me today. Please don't hesitate to contact me if I can be of assistance to you before our next scheduled appointment.  Your next care management appointment is by telephone on 07/31/24 at 2:00 PM  Please call the care guide team at [phone] if you need to cancel, schedule, or reschedule an appointment.   Please call 1-800-273-TALK (toll free, 24 hour hotline) if you are experiencing a Mental Health or Behavioral Health Crisis or need someone to talk to.  Clayborne Ly RN BSN CCM [place]  [facility], [facility] Health Nurse Care Coordinator  Direct Dial: [phone] Website: [email or web address]

## 2024-07-13 ENCOUNTER — Telehealth: Payer: Self-pay

## 2024-07-13 NOTE — Progress Notes (Signed)
   Telephone encounter was:  Unsuccessful.  07/13/2024 Name: Erin Roberson MRN: [id number] DOB: 1924-09-19  Unsuccessful outbound call made today to assist with:  Home Modifications  Outreach Attempt:  1st Attempt  No answer and unable to leave a message    Jon Colt [facility]  [facility] Guide, Phone: [phone] Fax: [phone] Website: [email or web address]

## 2024-07-17 ENCOUNTER — Telehealth: Payer: Self-pay

## 2024-07-17 NOTE — Progress Notes (Signed)
   Telephone encounter was:  Unsuccessful.  07/17/2024 Name: RAYLIN DIGUGLIELMO MRN: [id number] DOB: 1924/02/23  Unsuccessful outbound call made today to assist with:  Home Modifications  Outreach Attempt:  2nd Attempt  Voicemail is full   Jon Colt [facility] Guide, Phone: [phone] Fax: [phone] Website: [email or web address]

## 2024-07-21 ENCOUNTER — Telehealth: Payer: Self-pay

## 2024-07-21 NOTE — Progress Notes (Signed)
   Telephone encounter was:  Unsuccessful.  07/21/2024 Name: MORGANA ROWLEY MRN: [id number] DOB: 1923-10-24  Unsuccessful outbound call made today to assist with:  Home Modifications  Outreach Attempt:  3rd Attempt.  Referral closed unable to contact patient.  No answer and unable to leave a message    Jon Colt [facility] Guide, Phone: [phone] Fax: [phone] Website: [email or web address]

## 2024-07-31 ENCOUNTER — Other Ambulatory Visit: Payer: Self-pay | Admitting: Sports Medicine

## 2024-07-31 ENCOUNTER — Telehealth: Payer: Self-pay

## 2024-07-31 NOTE — Patient Instructions (Signed)
 Ronal LOISE Lesches - I am sorry I was unable to reach you today for our scheduled appointment. I work with Sebastian Beverley NOVAK, MD and am calling to support your healthcare needs. Please contact me at [phone] at your earliest convenience. I look forward to speaking with you soon.   Thank you,   Clayborne Ly RN BSN CCM [place]  [facility], [facility] Health Nurse Care Coordinator  Direct Dial: [phone] Website: [email or web address]

## 2024-08-03 ENCOUNTER — Other Ambulatory Visit: Payer: Self-pay | Admitting: Family

## 2024-08-03 NOTE — Telephone Encounter (Unsigned)
 Copied from CRM #[id number]. Topic: Clinical - Medication Refill >> Aug 03, 2024  3:57 PM Susanna ORN wrote: Medication: NAMZARIC  7-10 MG CP24  Has the patient contacted their pharmacy? Yes, was told to contact her provider's office.  (Agent: If no, request that the patient contact the pharmacy for the refill. If patient does not wish to contact the pharmacy document the reason why and proceed with request.) (Agent: If yes, when and what did the pharmacy advise?)  This is the patient's preferred pharmacy:  [facility] [id number] [address], [place] - [address] [place] [address] Phone: [phone] Fax: [phone]  Is this the correct pharmacy for this prescription? Yes If no, delete pharmacy and type the correct one.   Has the prescription been filled recently? Yes  Is the patient out of the medication? Yes  Has the patient been seen for an appointment in the last year OR does the patient have an upcoming appointment? Yes  Can we respond through MyChart? Yes  Agent: Please be advised that Rx refills may take up to 3 business days. We ask that you follow-up with your pharmacy.

## 2024-08-04 NOTE — Telephone Encounter (Signed)
 Not our patient

## 2024-08-05 ENCOUNTER — Other Ambulatory Visit: Payer: Self-pay | Admitting: Sports Medicine

## 2024-08-05 ENCOUNTER — Telehealth: Payer: Self-pay

## 2024-08-05 NOTE — Telephone Encounter (Signed)
 Dr.Thompson is listed as being patients PCP and that was noted on chart in October. We need clarity about this discrepancy.  Left message on voicemail for patient to return call when available to discuss or suggested sending a mychart message to clarify.

## 2024-08-05 NOTE — Telephone Encounter (Signed)
 Copied from CRM [id number]. Topic: Clinical - Prescription Issue >> Aug 05, 2024  2:28 PM Chiquita SQUIBB wrote: Reason for CRM: Patients daughter is calling in due to her being completely out of her NAMZARIC  7-10 MG CP24, patient was denied this medication but patient is a previous patient of Dr. Sherlynn and will be seeing Dinah in December. Patient has been out for over a week. Please contact [name] with any questions or issues.

## 2024-08-06 MED ORDER — NAMZARIC 7-10 MG PO CP24: 1.0000 | ORAL_CAPSULE | Freq: Every day | ORAL | 1 refills | Status: AC

## 2024-08-06 NOTE — Telephone Encounter (Signed)
 Spoke with Erin Roberson and she stated that she was not aware of who the provider is that was added as patients PCP. Erin Roberson confirmed that patient is only being seen at our practice.   RX filled as requested

## 2024-08-10 DIAGNOSIS — Z7982 Long term (current) use of aspirin: Secondary | ICD-10-CM | POA: Diagnosis not present

## 2024-08-10 DIAGNOSIS — G309 Alzheimer's disease, unspecified: Secondary | ICD-10-CM | POA: Diagnosis not present

## 2024-08-10 DIAGNOSIS — N1832 Chronic kidney disease, stage 3b: Secondary | ICD-10-CM | POA: Diagnosis not present

## 2024-08-10 DIAGNOSIS — I739 Peripheral vascular disease, unspecified: Secondary | ICD-10-CM | POA: Diagnosis not present

## 2024-08-10 DIAGNOSIS — I700 Atherosclerosis of aorta: Secondary | ICD-10-CM | POA: Diagnosis not present

## 2024-08-10 DIAGNOSIS — H348192 Central retinal vein occlusion, unspecified eye, stable: Secondary | ICD-10-CM | POA: Diagnosis not present

## 2024-08-10 DIAGNOSIS — K219 Gastro-esophageal reflux disease without esophagitis: Secondary | ICD-10-CM | POA: Diagnosis not present

## 2024-08-10 DIAGNOSIS — I129 Hypertensive chronic kidney disease with stage 1 through stage 4 chronic kidney disease, or unspecified chronic kidney disease: Secondary | ICD-10-CM | POA: Diagnosis not present

## 2024-08-10 DIAGNOSIS — Z8249 Family history of ischemic heart disease and other diseases of the circulatory system: Secondary | ICD-10-CM | POA: Diagnosis not present

## 2024-08-20 ENCOUNTER — Other Ambulatory Visit: Payer: Self-pay

## 2024-08-21 NOTE — Patient Instructions (Signed)
 Visit Information  Thank you for taking time to visit with me today.   Please call the Suicide and Crisis Lifeline: 988 if you are experiencing a Mental Health or Behavioral Health Crisis or need someone to talk to.  Clayborne Ly RN BSN CCM [place]  [facility], [facility] Health Nurse Care Coordinator  Direct Dial: [phone] Website: [email or web address]

## 2024-08-21 NOTE — Patient Outreach (Addendum)
 Complex Care Management   Visit Note  08/21/2024  Name:  MEA OZGA MRN: [id number] DOB: [birth year]  Situation: Referral received for Complex Care Management related to Dementia, Sensorineural hearing loss of both ears. I obtained verbal consent from Caregiver.  Visit completed with Caregiver/daughter [name] on the phone.  Background:   Past Medical History:  Diagnosis Date   Arthritis    Breast mass    left breast   Clotting disorder    Coronary atherosclerosis    a. s/p PCTA LAD (1992). b. Last cath in 10/11 with nonobstructive disease, EF 60% by LV-gram.    Dementia (HCC)    Depression    Diverticulosis of colon (without mention of hemorrhage)    Fibromyalgia    Glaucoma    Hemorrhage of rectum and anus    History of uterine cancer    Iron deficiency anemia, unspecified    Leukopenia    chronic--benign   Other pulmonary embolism and infarction    2011   Pure hypercholesterolemia    Thyroid  disease    UTI (lower urinary tract infection)     Assessment: Patient Reported Symptoms:  Cognitive Cognitive Status: Confused or disoriented, Requires Assistance Decision Making, Struggling with memory recall (completed call with daughter [name]) Cognitive/Intellectual Conditions Management [RPT]: Other Other: Dementia   Health Maintenance Behaviors: Annual physical exam, Sleep adequate Health Facilitated by: Rest  Neurological Neurological Review of Symptoms: Hearing changes Neurological Management Strategies: Routine screening Neurological Self-Management Outcome: 4 (good)  HEENT HEENT Symptoms Reported: Change or loss of hearing HEENT Management Strategies: Routine screening HEENT Self-Management Outcome: 4 (good)    Cardiovascular Cardiovascular Symptoms Reported: No symptoms reported Does patient have uncontrolled Hypertension?: No    Respiratory Respiratory Symptoms Reported: No symptoms reported    Endocrine Endocrine Symptoms Reported: No symptoms  reported Is patient diabetic?: No    Gastrointestinal Gastrointestinal Symptoms Reported: Incontinence Gastrointestinal Management Strategies: Incontinence garment/pad Gastrointestinal Self-Management Outcome: 4 (good)    Genitourinary Genitourinary Symptoms Reported: Incontinence Genitourinary Management Strategies: Incontinence garment/pad Genitourinary Self-Management Outcome: 4 (good)  Integumentary Integumentary Symptoms Reported: No symptoms reported    Musculoskeletal Musculoskelatal Symptoms Reviewed: Limited mobility, Difficulty walking, Unsteady gait Musculoskeletal Management Strategies: Medical device, Routine screening Musculoskeletal Self-Management Outcome: 4 (good) Falls in the past year?: No Number of falls in past year: 1 or less Was there an injury with Fall?: No Fall Risk Category Calculator: 0 Patient Fall Risk Level: Low Fall Risk Patient at Risk for Falls Due to: Impaired balance/gait, Impaired mobility Fall risk Follow up: Education provided, Falls evaluation completed  Psychosocial Psychosocial Symptoms Reported: Alteration in sleep habits, Flat affect Behavioral Management Strategies: Adequate rest, Support system Behavioral Health Self-Management Outcome: 4 (good) Behaviors When Feeling Stressed/Fearful: unable to assess Quality of Family Relationships: helpful, involved, supportive Do you feel physically threatened by others?: No    08/21/2024    PHQ2-9 Depression Screening   Monti Jilek interest or pleasure in doing things    Feeling down, depressed, or hopeless    PHQ-2 - Total Score    Trouble falling or staying asleep, or sleeping too much    Feeling tired or having Zilah Villaflor energy    Poor appetite or overeating     Feeling bad about yourself - or that you are a failure or have let yourself or your family down    Trouble concentrating on things, such as reading the newspaper or watching television    Moving or speaking so slowly that other people could  have noticed.  Or the opposite - being so fidgety or restless that you have been moving around a lot more than usual    Thoughts that you would be better off dead, or hurting yourself in some way    PHQ2-9 Total Score    If you checked off any problems, how difficult have these problems made it for you to do your work, take care of things at home, or get along with other people    Depression Interventions/Treatment      There were no vitals filed for this visit. Pain Scale: Not given for pain  Medications Reviewed Today     Reviewed by [name], RN (Registered Nurse) on 08/20/24 at 1145  Med List Status: <None>   Medication Order Taking? Sig Documenting Provider Last Dose Status Informant  aspirin  EC 81 MG tablet [id number]  Take 1 tablet (81 mg total) by mouth daily. End, [name], MD  Active   midodrine  (PROAMATINE ) 5 MG tablet [id number]  Take 1 tablet (5 mg total) by mouth 2 (two) times daily. [name], MD  Active   NAMZARIC  7-10 MG CP24 [id number]  Take 1 capsule by mouth daily. Ngetich, Dinah C, NP  Active   Vitamin D , Ergocalciferol , (DRISDOL ) 1.25 MG (50000 UNIT) CAPS capsule [id number]  Take 50,000 Units by mouth every 14 (fourteen) days. [provider]  Active             Recommendation:   PCP Follow-up  09/29/2024 Status: Sch   Time: 3:00 PM Length: 20  Visit Type: OFFICE VISIT [8002] Copay: $0.00  Provider: Ngetich, Roxan BROCKS, NP Department: [facility]   Continue Current Plan of Care  Follow Up Plan:   Closing From:  Complex Care Management  [name] [facility]  [facility] Nurse Care Coordinator  Direct Dial: [phone] Website: [email or web address]

## 2024-09-29 ENCOUNTER — Ambulatory Visit: Payer: Self-pay | Admitting: Family

## 2024-09-29 ENCOUNTER — Encounter: Payer: Self-pay | Admitting: Family

## 2024-09-29 VITALS — BP 104/70 | HR 61 | Temp 97.7°F | Resp 17 | Ht 63.5 in | Wt 127.2 lb

## 2024-09-29 DIAGNOSIS — E559 Vitamin D deficiency, unspecified: Secondary | ICD-10-CM

## 2024-09-29 DIAGNOSIS — F0390 Unspecified dementia without behavioral disturbance: Secondary | ICD-10-CM

## 2024-09-29 DIAGNOSIS — I959 Hypotension, unspecified: Secondary | ICD-10-CM | POA: Diagnosis not present

## 2024-09-29 DIAGNOSIS — H6122 Impacted cerumen, left ear: Secondary | ICD-10-CM

## 2024-09-29 DIAGNOSIS — Z23 Encounter for immunization: Secondary | ICD-10-CM

## 2024-09-29 LAB — COMPLETE METABOLIC PANEL WITHOUT GFR
AG Ratio: 1.3 (calc) (ref 1.0–2.5)
ALT: 7 U/L (ref 6–29)
AST: 15 U/L (ref 10–35)
Albumin: 3.4 g/dL — ABNORMAL LOW (ref 3.6–5.1)
Alkaline phosphatase (APISO): 77 U/L (ref 37–153)
BUN/Creatinine Ratio: 21 (calc) (ref 6–22)
BUN: 27 mg/dL — ABNORMAL HIGH (ref 7–25)
CO2: 26 mmol/L (ref 20–32)
Calcium: 8.8 mg/dL (ref 8.6–10.4)
Chloride: 109 mmol/L (ref 98–110)
Creat: 1.31 mg/dL — ABNORMAL HIGH (ref 0.60–0.95)
Globulin: 2.7 g/dL (ref 1.9–3.7)
Glucose, Bld: 98 mg/dL (ref 65–139)
Potassium: 4.3 mmol/L (ref 3.5–5.3)
Sodium: 142 mmol/L (ref 135–146)
Total Bilirubin: 0.4 mg/dL (ref 0.2–1.2)
Total Protein: 6.1 g/dL (ref 6.1–8.1)

## 2024-09-29 MED ORDER — DEBROX 6.5 % OT SOLN: 5.0000 [drp] | Freq: Two times a day (BID) | OTIC | 0 refills | Status: AC

## 2024-09-29 MED ORDER — VITAMIN D (ERGOCALCIFEROL) 1.25 MG (50000 UNIT) PO CAPS: 50000.0000 [IU] | ORAL_CAPSULE | ORAL | 6 refills | Status: AC

## 2024-09-29 NOTE — Patient Instructions (Addendum)
 1.Report to local pharmacy to receive Covid vaccine. 2.Stop at check out & schedule Annual Wellness Visit. 3. - Instill debrox 6.5 otic solution 5 drops into left ear twice daily x 4 days then follow up for ear lavage.May apply cotton ball at bedtime to prevent drainage to pillow.

## 2024-09-29 NOTE — Progress Notes (Unsigned)
 "  Provider: Mumtaz Lovins FNP-C   Najmah Carradine, Roxan BROCKS, NP  Patient Care Team: Daje Stark, Roxan BROCKS, NP as PCP - General (Family Medicine) End, [name], MD as PCP - Cardiology (Cardiology) Morris Debby BIRCH, MD as Referring Physician (Cardiology) Rosalynn LELON Ingle, MD (Inactive) as Attending Physician (Obstetrics and Gynecology)  Extended Emergency Contact Information Primary Emergency Contact: Arloa [name] PARAS Address: [address]          [place], [place] [address] [place] Mobile Phone: [phone] Relation: Daughter Secondary Emergency Contact: Tate,Claira  [place] Home Phone: [phone] Mobile Phone: [phone] Relation: Friend  Code Status:  Full Code  Goals of care: Advanced Directive information    09/29/2024    3:09 PM  Advanced Directives  Does Patient Have a Medical Advance Directive? Yes  Type of Advance Directive Healthcare Power of Attorney  Does patient want to make changes to medical advance directive? No - Patient declined  Copy of Healthcare Power of Attorney in Chart? No - copy requested     Chief Complaint  Patient presents with   Medical Management of Chronic Issues    3 Month follow up.    History of Present Illness   Erin Roberson is a 89 year old female who presents with headaches and dizziness.  She experiences headaches two to three times a week, primarily in the morning upon waking, with pain localized to the forehead. These headaches have been ongoing for a long time, including during her previous visit three months ago. She takes 200 mg of ibuprofen, which provides relief. Occasionally, the headaches are accompanied by dizziness.  On September 27, 2024, she experienced an episode of nausea and vomiting along with stomach pains, although it is unclear if this was associated with a headache. No diarrhea or loose stools, but she has a history of bowel impactions. She does not currently take a stool softener and reports  her last bowel movement was yesterday.  She does not monitor her blood pressure at home. She drinks about one and a half glasses of water a day, which is less than recommended.  She is currently taking aspirin , Namzaric  for memory, and vitamin D  50,000 units every two weeks, although she has missed doses recently. Her last vitamin D  level check was in October, and she has had only one or two doses since then. Her white blood cell count was low in October.  No nausea today, diarrhea, or burning during urination. No pain when swallowing.    Past Medical History:  Diagnosis Date   Arthritis    Breast mass    left breast   Clotting disorder    Coronary atherosclerosis    a. s/p PCTA LAD (1992). b. Last cath in 10/11 with nonobstructive disease, EF 60% by LV-gram.    Dementia (HCC)    Depression    Diverticulosis of colon (without mention of hemorrhage)    Fibromyalgia    Glaucoma    Hemorrhage of rectum and anus    History of uterine cancer    Iron deficiency anemia, unspecified    Leukopenia    chronic--benign   Other pulmonary embolism and infarction    2011   Pure hypercholesterolemia    Thyroid  disease    UTI (lower urinary tract infection)    Past Surgical History:  Procedure Laterality Date   ABDOMINAL HYSTERECTOMY     CARDIAC CATHETERIZATION  07/25/10   luminal irregularities in multiple vessels, no sig stenosis, EF 60%  CATARACT EXTRACTION  2004   right   COLONOSCOPY  07/01/2007   diverticulosis   CORONARY ANGIOPLASTY  1992   LAD   KIDNEY SURGERY     stent placed   NECK SURGERY     thyroid  needle aspiration  12/2007   right, hyperplastic nodule   UPPER GASTROINTESTINAL ENDOSCOPY  07/16/2007   tortous esophagus with spasms    Allergies[1]  Allergies as of 09/29/2024   No Known Allergies      Medication List        Accurate as of September 29, 2024  4:20 PM. If you have any questions, ask your nurse or doctor.          aspirin  EC 81 MG  tablet Take 1 tablet (81 mg total) by mouth daily.   Debrox 6.5 % OTIC solution Generic drug: carbamide peroxide Place 5 drops into the left ear 2 (two) times daily for 4 days. Started by: [name], NP   midodrine  5 MG tablet Commonly known as: PROAMATINE  Take 1 tablet (5 mg total) by mouth 2 (two) times daily.   Namzaric  7-10 MG Cp24 Generic drug: Memantine  HCl-Donepezil  HCl ER Take 1 capsule by mouth daily.   Vitamin D  (Ergocalciferol ) 1.25 MG (50000 UNIT) Caps capsule Commonly known as: DRISDOL  Take 1 capsule (50,000 Units total) by mouth every 14 (fourteen) days.        Review of Systems  Unable to perform ROS: Dementia (daughter provided HPI information)  Constitutional:  Negative for appetite change, chills, fatigue, fever and unexpected weight change.  HENT:  Negative for congestion, dental problem, ear discharge, ear pain, facial swelling, hearing loss, nosebleeds, postnasal drip, rhinorrhea, sinus pressure, sinus pain, sneezing, sore throat, tinnitus and trouble swallowing.   Eyes:  Negative for pain, discharge, redness, itching and visual disturbance.  Respiratory:  Negative for cough, chest tightness, shortness of breath and wheezing.   Cardiovascular:  Negative for chest pain, palpitations and leg swelling.  Gastrointestinal:  Negative for abdominal distention, abdominal pain, blood in stool, constipation, diarrhea, nausea and vomiting.  Endocrine: Negative for cold intolerance, heat intolerance, polydipsia, polyphagia and polyuria.  Genitourinary:  Negative for difficulty urinating, dysuria, flank pain and urgency.       Incontinent for urine   Musculoskeletal:  Positive for gait problem. Negative for arthralgias, back pain, joint swelling, myalgias, neck pain and neck stiffness.  Skin:  Negative for color change, pallor, rash and wound.  Neurological:  Negative for syncope, speech difficulty, weakness, light-headedness and numbness.       Chronic headache and  dizziness  Hematological:  Does not bruise/bleed easily.  Psychiatric/Behavioral:  Negative for agitation, behavioral problems, confusion, hallucinations, self-injury, sleep disturbance and suicidal ideas. The patient is not nervous/anxious.     Immunization History  Administered Date(s) Administered   INFLUENZA, HIGH DOSE SEASONAL PF 05/26/2019, 09/29/2024   PFIZER(Purple Top)SARS-COV-2 Vaccination 12/12/2019, 01/06/2020, 10/26/2020   Pertinent  Health Maintenance Due  Topic Date Due   Bone Density Scan  09/29/2025 (Originally 11/16/1988)   Influenza Vaccine  Completed      06/02/2024   11:35 AM 06/30/2024    2:37 PM 06/30/2024    2:52 PM 08/20/2024   11:25 AM 09/29/2024    3:08 PM  Fall Risk  Falls in the past year? 1 1 1  0 0  Was there an injury with Fall? 1  0   0  0  Fall Risk Category Calculator 2 2  0 0  Patient at Risk for Falls  Due to History of fall(s);Impaired balance/gait;Impaired mobility History of fall(s);Impaired balance/gait  Impaired balance/gait;Impaired mobility No Fall Risks  Fall risk Follow up Falls evaluation completed;Education provided;Falls prevention discussed Falls evaluation completed  Education provided;Falls evaluation completed Falls evaluation completed     Data saved with a previous flowsheet row definition   Functional Status Survey:    Vitals:   09/29/24 1510  BP: 104/70  Pulse: 61  Resp: 17  Temp: 97.7 F (36.5 C)  SpO2: 99%  Weight: 127 lb 3.2 oz (57.7 kg)  Height: 5' 3.5 (1.613 m)   Body mass index is 22.18 kg/m. Physical Exam  VITALS: BP- 104/ GENERAL: Alert, cooperative, well developed, no acute distress. HEENT: Normocephalic, normal oropharynx, moist mucous membranes, cerumen buildup in left ear, right ear normal, nose normal. CHEST: Clear to auscultation bilaterally, no wheezes, rhonchi, or crackles. CARDIOVASCULAR: Normal heart rate and rhythm, S1 and S2 normal without murmurs. ABDOMEN: Soft, non-tender, non-distended,  without organomegaly, normal bowel sounds. EXTREMITIES: No cyanosis or edema, extremities normal. NEUROLOGICAL: Cranial nerves grossly intact, moves all extremities without gross motor or sensory deficit.    SKIN: No rash,no lesion or erythema   PSYCHIATRY/BEHAVIORAL: Mood stable    Labs reviewed: Recent Labs    12/11/23 1455 03/17/24 1515 07/01/24 1412  NA 142 142 141  K 4.4 4.2 4.0  CL 108 107 107  CO2 31 28 27   GLUCOSE 94 71 99  BUN 19 30* 23  CREATININE 1.17* 1.13* 1.07*  CALCIUM  9.0 9.0 8.9  MG  --   --  2.2   Recent Labs    12/11/23 1455  AST 16  ALT 11  BILITOT 0.4  PROT 6.2   Recent Labs    11/04/23 2022 03/17/24 1515 07/01/24 1412  WBC 3.7* 3.8 3.6*  HGB 13.1 12.0 12.2  HCT 40.2 36.8 36.4  MCV 96.4 96.1 96.8  PLT 196 227 189   Lab Results  Component Value Date   TSH 2.184 01/17/2022   No results found for: HGBA1C Lab Results  Component Value Date   CHOL 229 (H) 01/17/2022   HDL 64 01/17/2022   LDLCALC 148 (H) 01/17/2022   LDLDIRECT 177 (H) 05/15/2018   TRIG 85 01/17/2022   CHOLHDL 3.6 01/17/2022    Significant Diagnostic Results in last 30 days:  No results found.  Assessment/Plan Assessment and Plan    Headache and hypotension Chronic headaches occurring 2-3 times a week, primarily in the morning, with a diffuse pattern. Associated with dizziness and occasional nausea. Hypotension with blood pressure at 104/60 mmHg. Dehydration suspected as a contributing factor. - Increase water intake to 6-8 glasses per day, spread throughout the day. - Use ibuprofen 200 mg as needed for headache relief.  Major neurocognitive disorder Currently managed with Namzaric  (combination of Namenda  and Aricept ) 10 mg once daily. - Continue Namzaric  10 mg once daily.  Vitamin D  deficiency Vitamin D  supplementation not taken regularly. Last dose was two weeks ago. Previous vitamin D  levels checked in October. - Resume vitamin D  50,000 units every two  weeks.  Cerumen impaction, left ear Cerumen impaction in the left ear causing hearing issues. - Use ear drops to soften wax: 5 drops in the morning and 5 drops in the evening for 4 days. - Return for ear irrigation after 4 days of ear drop use.  General health maintenance Need for dental cleaning and dietary adjustments to improve overall health. - Schedule dental cleaning appointment. - Increase intake of fruits, vegetables, and vitamin  C to boost immunity.          Family/ staff Communication: Reviewed plan of care with patient  Labs/tests ordered: None   Next Appointment : Return in about 4 months (around 01/28/2025) for medical mangement of chronic issues. left ear cerumen impaction in one week .   Spent 30 minutes of Face to face and non-face to face with patient  >50% time spent counseling; reviewing medical record; tests; labs; documentation and developing future plan of care.   Roxan BROCKS Nicki Furlan, NP       [1] No Known Allergies  "

## 2024-09-30 ENCOUNTER — Telehealth: Payer: Self-pay | Admitting: Family

## 2024-09-30 ENCOUNTER — Ambulatory Visit: Payer: Self-pay | Admitting: Family

## 2024-09-30 NOTE — Telephone Encounter (Signed)
 Called the patient's daughter to let her know Roxan would like the patient to be scheduled for ear lavage for left ear cerumen impaction in one week. Unable to leave a voicemail.

## 2024-09-30 NOTE — Telephone Encounter (Signed)
 Noted

## 2024-09-30 NOTE — Telephone Encounter (Signed)
Patient is scheduled for 01/08.

## 2024-10-02 NOTE — Telephone Encounter (Signed)
 Copied from CRM [id number]. Topic: Clinical - Lab/Test Results >> Oct 02, 2024  2:09 PM Carrielelia G wrote: Read results to daughter....  Ngetich, Roxan BROCKS, NP to [facility]  09/30/24  1:50 PM  Result Note - Lab results are within normal range except kidney functions are little bit dry and has slightly worsened compared to previous.  Recommend hydration as discussed during visit. -Albumin is also slightly low.  Increase protein in the diet or drink Ensure 237 one can by mouth twice daily. Complete Metabolic Panel with eGFR  Daughter had no questions and said thank you.

## 2024-10-02 NOTE — Telephone Encounter (Signed)
Noted thank  You

## 2024-10-08 ENCOUNTER — Ambulatory Visit: Admitting: Family

## 2024-10-08 ENCOUNTER — Encounter: Payer: Self-pay | Admitting: Family

## 2024-10-08 VITALS — BP 120/58 | HR 74 | Temp 97.1°F | Resp 18 | Ht 63.5 in | Wt 128.8 lb

## 2024-10-08 DIAGNOSIS — H6122 Impacted cerumen, left ear: Secondary | ICD-10-CM

## 2024-10-18 NOTE — Progress Notes (Signed)
 "  Provider: Saara Kijowski FNP-C  Maryelizabeth Eberle, Roxan BROCKS, NP  Patient Care Team: [place] Current, Roxan BROCKS, NP as PCP - General (Family Medicine) End, [name], MD as PCP - Cardiology (Cardiology) Morris Debby BIRCH, MD as Referring Physician (Cardiology) Rosalynn LELON Ingle, MD (Inactive) as Attending Physician (Obstetrics and Gynecology)  Extended Emergency Contact Information Primary Emergency Contact: Arloa [name] PARAS Address: [address]          [place], [place] [address] [place] Phone: [phone] Relation: Daughter Secondary Emergency Contact: Corlis Heritage  [place] Home Phone: [phone] Mobile Phone: [phone] Relation: Friend  Code Status:  Full Code  Goals of care: Advanced Directive information    09/29/2024    3:09 PM  Advanced Directives  Does Patient Have a Medical Advance Directive? Yes  Type of Advance Directive Healthcare Power of Attorney  Does patient want to make changes to medical advance directive? No - Patient declined  Copy of Healthcare Power of Attorney in Chart? No - copy requested     Chief Complaint  Patient presents with   Cerumen Impaction    Ear lavage for left ear cerumen impaction     History of Present Illness   Erin Roberson is a 89 year old female who presents for follow-up regarding ear wax removal.  She has been using ear drops, five drops twice a day, for four days from Sunday through the previous night. No ear pain is present, and her hearing is good. She mentions that the wax was expected to be brown but came out white.  She has a mild cough and some congestion. No forehead pain is present.  No ear pain, forehead pain, fever, or chills. She reports a mild cough and some congestion.   Past Medical History:  Diagnosis Date   Arthritis    Breast mass    left breast   Clotting disorder    Coronary atherosclerosis    a. s/p PCTA LAD (1992). b. Last cath in 10/11 with nonobstructive disease, EF 60% by  LV-gram.    Dementia (HCC)    Depression    Diverticulosis of colon (without mention of hemorrhage)    Fibromyalgia    Glaucoma    Hemorrhage of rectum and anus    History of uterine cancer    Iron deficiency anemia, unspecified    Leukopenia    chronic--benign   Other pulmonary embolism and infarction    2011   Pure hypercholesterolemia    Thyroid  disease    UTI (lower urinary tract infection)    Past Surgical History:  Procedure Laterality Date   ABDOMINAL HYSTERECTOMY     CARDIAC CATHETERIZATION  07/25/10   luminal irregularities in multiple vessels, no sig stenosis, EF 60%   CATARACT EXTRACTION  2004   right   COLONOSCOPY  07/01/2007   diverticulosis   CORONARY ANGIOPLASTY  1992   LAD   KIDNEY SURGERY     stent placed   NECK SURGERY     thyroid  needle aspiration  12/2007   right, hyperplastic nodule   UPPER GASTROINTESTINAL ENDOSCOPY  07/16/2007   tortous esophagus with spasms    Allergies[1]  Outpatient Encounter Medications as of 10/08/2024  Medication Sig   aspirin  EC 81 MG tablet Take 1 tablet (81 mg total) by mouth daily.   midodrine  (PROAMATINE ) 5 MG tablet Take 1 tablet (5 mg total) by mouth 2 (two) times daily.   NAMZARIC  7-10 MG CP24 Take 1 capsule by mouth daily.  Vitamin D , Ergocalciferol , (DRISDOL ) 1.25 MG (50000 UNIT) CAPS capsule Take 1 capsule (50,000 Units total) by mouth every 14 (fourteen) days.   No facility-administered encounter medications on file as of 10/08/2024.    Review of Systems  Constitutional:  Negative for appetite change, chills, fatigue, fever and unexpected weight change.  HENT:  Negative for congestion, ear discharge, ear pain, hearing loss, nosebleeds, postnasal drip, rhinorrhea, sinus pressure, sinus pain, sneezing, sore throat, tinnitus and trouble swallowing.   Eyes:  Negative for pain, discharge, redness, itching and visual disturbance.  Respiratory:  Positive for cough. Negative for chest tightness, shortness of breath and  wheezing.   Cardiovascular:  Negative for chest pain, palpitations and leg swelling.  Genitourinary:  Negative for difficulty urinating, dysuria, flank pain, frequency and urgency.  Skin:  Negative for color change, pallor, rash and wound.  Neurological:  Negative for dizziness, weakness, light-headedness, numbness and headaches.  Hematological:  Does not bruise/bleed easily.  Psychiatric/Behavioral:  Negative for agitation, behavioral problems, confusion, hallucinations, self-injury, sleep disturbance and suicidal ideas. The patient is not nervous/anxious.     Immunization History  Administered Date(s) Administered   INFLUENZA, HIGH DOSE SEASONAL PF 05/26/2019, 09/29/2024   PFIZER(Purple Top)SARS-COV-2 Vaccination 12/12/2019, 01/06/2020, 10/26/2020   Pertinent  Health Maintenance Due  Topic Date Due   Bone Density Scan  09/29/2025 (Originally 11/16/1988)   Influenza Vaccine  Completed      06/02/2024   11:35 AM 06/30/2024    2:37 PM 06/30/2024    2:52 PM 08/20/2024   11:25 AM 09/29/2024    3:08 PM  Fall Risk  Falls in the past year? 1 1 1  0 0  Was there an injury with Fall? 1  0   0  0  Fall Risk Category Calculator 2 2  0 0  Patient at Risk for Falls Due to History of fall(s);Impaired balance/gait;Impaired mobility History of fall(s);Impaired balance/gait  Impaired balance/gait;Impaired mobility No Fall Risks  Fall risk Follow up Falls evaluation completed;Education provided;Falls prevention discussed Falls evaluation completed  Education provided;Falls evaluation completed Falls evaluation completed     Data saved with a previous flowsheet row definition   Functional Status Survey:    Vitals:   10/08/24 1336  BP: (!) 120/58  Pulse: 74  Resp: 18  Temp: (!) 97.1 F (36.2 C)  SpO2: 97%  Weight: 128 lb 12.8 oz (58.4 kg)  Height: 5' 3.5 (1.613 m)   Body mass index is 22.46 kg/m. Physical Exam  VITALS: T- 97.1, P- 74, BP- 120/58, SaO2- 97% MEASUREMENTS: Weight-  128.8. GENERAL: Alert, cooperative, well developed, no acute distress. HEENT: Normocephalic, normal oropharynx, moist mucous membranes, ears normal with no cerumen impaction and hearing grossly intact, nose normal with no discharge or exudate, oral cavity normal with no lesions and normal dentition, no sinus tenderness. CHEST: Clear to auscultation bilaterally, no wheezes, rhonchi, or crackles. CARDIOVASCULAR: Normal heart rate and rhythm, S1 and S2 normal without murmurs. ABDOMEN: Soft, non-tender, non-distended, without organomegaly, normal bowel sounds. EXTREMITIES: No cyanosis or edema. NEUROLOGICAL: Cranial nerves grossly intact, moves all extremities without gross motor or sensory deficit.    Labs reviewed: Recent Labs    03/17/24 1515 07/01/24 1412 09/29/24 1558  NA 142 141 142  K 4.2 4.0 4.3  CL 107 107 109  CO2 28 27 26   GLUCOSE 71 99 98  BUN 30* 23 27*  CREATININE 1.13* 1.07* 1.31*  CALCIUM  9.0 8.9 8.8  MG  --  2.2  --    Recent  Labs    12/11/23 1455 09/29/24 1558  AST 16 15  ALT 11 7  BILITOT 0.4 0.4  PROT 6.2 6.1   Recent Labs    11/04/23 2022 03/17/24 1515 07/01/24 1412  WBC 3.7* 3.8 3.6*  HGB 13.1 12.0 12.2  HCT 40.2 36.8 36.4  MCV 96.4 96.1 96.8  PLT 196 227 189   Lab Results  Component Value Date   TSH 2.184 01/17/2022   No results found for: HGBA1C Lab Results  Component Value Date   CHOL 229 (H) 01/17/2022   HDL 64 01/17/2022   LDLCALC 148 (H) 01/17/2022   LDLDIRECT 177 (H) 05/15/2018   TRIG 85 01/17/2022   CHOLHDL 3.6 01/17/2022    Significant Diagnostic Results in last 30 days:  No results found.  Assessment/Plan  Impacted cerumen, left ear Impacted cerumen in the left ear was successfully flushed with hydrogen peroxide and water. No pain reported. Eardrum appears healthy. Wax was white.No instrument used. No further ear drops needed as the eardrum is healthy. - Continue to monitor for any ear pain, fever, or chills and report  if she occurs. - No further ear drops needed as the eardrum is healthy.  General health maintenance Blood pressure is well-controlled at 120/58. Temperature is 97.73F, heart rate is 74, and oxygen saturation is 97%. Weight is 128.8 lbs, slightly increased from last visit. Encouraged to increase protein intake to support overall health. - Encouraged increased protein intake, including chicken, beef, eggs, and protein supplements. - Scheduled follow-up appointment for April 30th for regular four-month check-up.   Family/ staff Communication: Reviewed plan of care with patient verbalized understanding   Labs/tests ordered: None   Next Appointment: Return if symptoms worsen or fail to improve.   Total time: 20 minutes. Greater than 50% of total time spent doing patient education regarding Impacted cerumen, left earhealth maintenance including symptom/medication management.   Roxan BROCKS Ilai Hiller, NP    [1] No Known Allergies  "

## 2025-01-28 ENCOUNTER — Ambulatory Visit: Admitting: Family

## 2025-03-09 ENCOUNTER — Other Ambulatory Visit: Payer: Self-pay
